# Patient Record
Sex: Male | Born: 1960 | Race: Black or African American | Hispanic: No | Marital: Single | Smoking: Current some day smoker
Health system: Southern US, Community
[De-identification: ages and names within clinical notes are randomized; demographics above are authoritative.]

## PROBLEM LIST (undated history)

## (undated) DIAGNOSIS — M62838 Other muscle spasm: Secondary | ICD-10-CM

## (undated) DIAGNOSIS — N184 Chronic kidney disease, stage 4 (severe): Secondary | ICD-10-CM

## (undated) DIAGNOSIS — E119 Type 2 diabetes mellitus without complications: Secondary | ICD-10-CM

## (undated) DIAGNOSIS — E785 Hyperlipidemia, unspecified: Secondary | ICD-10-CM

## (undated) DIAGNOSIS — K5900 Constipation, unspecified: Secondary | ICD-10-CM

## (undated) DIAGNOSIS — N390 Urinary tract infection, site not specified: Secondary | ICD-10-CM

## (undated) DIAGNOSIS — L850 Acquired ichthyosis: Secondary | ICD-10-CM

## (undated) DIAGNOSIS — E559 Vitamin D deficiency, unspecified: Secondary | ICD-10-CM

## (undated) DIAGNOSIS — M109 Gout, unspecified: Secondary | ICD-10-CM

## (undated) DIAGNOSIS — R634 Abnormal weight loss: Secondary | ICD-10-CM

## (undated) DIAGNOSIS — I509 Heart failure, unspecified: Secondary | ICD-10-CM

## (undated) DIAGNOSIS — E215 Disorder of parathyroid gland, unspecified: Secondary | ICD-10-CM

## (undated) DIAGNOSIS — R251 Tremor, unspecified: Secondary | ICD-10-CM

## (undated) DIAGNOSIS — K219 Gastro-esophageal reflux disease without esophagitis: Secondary | ICD-10-CM

## (undated) DIAGNOSIS — M6281 Muscle weakness (generalized): Secondary | ICD-10-CM

## (undated) DIAGNOSIS — D649 Anemia, unspecified: Secondary | ICD-10-CM

## (undated) DIAGNOSIS — K739 Chronic hepatitis, unspecified: Secondary | ICD-10-CM

## (undated) DIAGNOSIS — I10 Essential (primary) hypertension: Secondary | ICD-10-CM

## (undated) DIAGNOSIS — M1000 Idiopathic gout, unspecified site: Secondary | ICD-10-CM

## (undated) DIAGNOSIS — Z970 Presence of artificial eye: Secondary | ICD-10-CM

## (undated) DIAGNOSIS — M1990 Unspecified osteoarthritis, unspecified site: Secondary | ICD-10-CM

## (undated) DIAGNOSIS — S02609A Fracture of mandible, unspecified, initial encounter for closed fracture: Secondary | ICD-10-CM

## (undated) HISTORY — PX: EYE SURGERY: SHX253

## (undated) HISTORY — PX: CATARACT EXTRACTION: SUR2

## (undated) SURGERY — Surgical Case
Anesthesia: *Unknown

---

## 1999-03-30 HISTORY — PX: MANDIBLE FRACTURE SURGERY: SHX706

## 2002-11-25 DIAGNOSIS — S12600A Unspecified displaced fracture of seventh cervical vertebra, initial encounter for closed fracture: Secondary | ICD-10-CM

## 2002-11-25 DIAGNOSIS — G8220 Paraplegia, unspecified: Secondary | ICD-10-CM

## 2002-11-25 DIAGNOSIS — S329XXA Fracture of unspecified parts of lumbosacral spine and pelvis, initial encounter for closed fracture: Secondary | ICD-10-CM

## 2002-11-25 DIAGNOSIS — S72409A Unspecified fracture of lower end of unspecified femur, initial encounter for closed fracture: Secondary | ICD-10-CM

## 2002-11-25 HISTORY — PX: ORIF ULNAR / RADIAL SHAFT FRACTURE: SUR966

## 2002-11-25 HISTORY — DX: Fracture of unspecified parts of lumbosacral spine and pelvis, initial encounter for closed fracture: S32.9XXA

## 2002-11-25 HISTORY — DX: Unspecified displaced fracture of seventh cervical vertebra, initial encounter for closed fracture: S12.600A

## 2002-11-25 HISTORY — DX: Unspecified fracture of lower end of unspecified femur, initial encounter for closed fracture: S72.409A

## 2002-11-25 HISTORY — PX: HIP FRACTURE SURGERY: SHX118

## 2002-11-25 HISTORY — DX: Paraplegia, unspecified: G82.20

## 2003-06-25 HISTORY — PX: IVC FILTER INSERTION: CATH118245

## 2009-06-12 ENCOUNTER — Encounter (HOSPITAL_BASED_OUTPATIENT_CLINIC_OR_DEPARTMENT_OTHER): Admission: RE | Admit: 2009-06-12 | Discharge: 2009-08-24 | Payer: Self-pay | Admitting: General Surgery

## 2009-07-04 ENCOUNTER — Emergency Department (HOSPITAL_COMMUNITY): Admission: EM | Admit: 2009-07-04 | Discharge: 2009-07-04 | Payer: Self-pay | Admitting: Emergency Medicine

## 2009-08-01 ENCOUNTER — Emergency Department (HOSPITAL_COMMUNITY): Admission: EM | Admit: 2009-08-01 | Discharge: 2009-08-01 | Payer: Self-pay | Admitting: Emergency Medicine

## 2009-08-29 ENCOUNTER — Encounter (HOSPITAL_BASED_OUTPATIENT_CLINIC_OR_DEPARTMENT_OTHER): Admission: RE | Admit: 2009-08-29 | Discharge: 2009-10-02 | Payer: Self-pay | Admitting: General Surgery

## 2009-09-21 ENCOUNTER — Emergency Department (HOSPITAL_COMMUNITY): Admission: EM | Admit: 2009-09-21 | Discharge: 2009-09-21 | Payer: Self-pay | Admitting: Emergency Medicine

## 2009-10-02 ENCOUNTER — Encounter (HOSPITAL_BASED_OUTPATIENT_CLINIC_OR_DEPARTMENT_OTHER): Admission: RE | Admit: 2009-10-02 | Discharge: 2009-11-06 | Payer: Self-pay | Admitting: Internal Medicine

## 2009-10-03 ENCOUNTER — Encounter (INDEPENDENT_AMBULATORY_CARE_PROVIDER_SITE_OTHER): Payer: Self-pay | Admitting: Emergency Medicine

## 2009-10-03 ENCOUNTER — Ambulatory Visit: Payer: Self-pay | Admitting: Vascular Surgery

## 2009-10-03 ENCOUNTER — Emergency Department (HOSPITAL_COMMUNITY): Admission: EM | Admit: 2009-10-03 | Discharge: 2009-10-03 | Payer: Self-pay | Admitting: Emergency Medicine

## 2009-12-10 ENCOUNTER — Inpatient Hospital Stay (HOSPITAL_COMMUNITY): Admission: EM | Admit: 2009-12-10 | Discharge: 2009-12-11 | Payer: Self-pay | Admitting: Emergency Medicine

## 2010-01-23 ENCOUNTER — Inpatient Hospital Stay (HOSPITAL_COMMUNITY): Admission: EM | Admit: 2010-01-23 | Discharge: 2010-01-26 | Payer: Self-pay | Admitting: Emergency Medicine

## 2010-10-29 ENCOUNTER — Emergency Department (HOSPITAL_COMMUNITY)
Admission: EM | Admit: 2010-10-29 | Discharge: 2010-10-29 | Payer: Self-pay | Source: Home / Self Care | Admitting: Podiatry

## 2010-11-25 DIAGNOSIS — S0292XA Unspecified fracture of facial bones, initial encounter for closed fracture: Secondary | ICD-10-CM

## 2010-11-25 HISTORY — DX: Unspecified fracture of facial bones, initial encounter for closed fracture: S02.92XA

## 2011-02-10 LAB — URINALYSIS, ROUTINE W REFLEX MICROSCOPIC
Bilirubin Urine: NEGATIVE
pH: 7.5 (ref 5.0–8.0)

## 2011-02-10 LAB — BASIC METABOLIC PANEL
BUN: 10 mg/dL (ref 6–23)
CO2: 25 mEq/L (ref 19–32)
Chloride: 106 mEq/L (ref 96–112)
Creatinine, Ser: 1.37 mg/dL (ref 0.4–1.5)
GFR calc non Af Amer: 55 mL/min — ABNORMAL LOW (ref >60)
Potassium: 4.1 mEq/L (ref 3.5–5.1)
Sodium: 138 mEq/L (ref 135–145)

## 2011-02-10 LAB — CBC
HCT: 38.3 % — ABNORMAL LOW (ref 39.0–52.0)
HCT: 45.9 % (ref 39.0–52.0)
Hemoglobin: 12.6 g/dL — ABNORMAL LOW (ref 13.0–17.0)
Hemoglobin: 15.2 g/dL (ref 13.0–17.0)
MCHC: 33.1 g/dL (ref 30.0–36.0)
MCV: 88.3 fL (ref 78.0–100.0)
MCV: 88.5 fL (ref 78.0–100.0)
Platelets: 142 10*3/uL — ABNORMAL LOW (ref 150–400)
RBC: 5.2 MIL/uL (ref 4.22–5.81)
RDW: 14.6 % (ref 11.5–15.5)
RDW: 14.8 % (ref 11.5–15.5)
WBC: 5.3 10*3/uL (ref 4.0–10.5)

## 2011-02-10 LAB — DIFFERENTIAL
Lymphocytes Relative: 21 % (ref 12–46)
Monocytes Relative: 9 % (ref 3–12)
Neutro Abs: 3.7 10*3/uL (ref 1.7–7.7)
Neutrophils Relative %: 69 % (ref 43–77)

## 2011-02-10 LAB — VALPROIC ACID LEVEL: Valproic Acid Lvl: 51.5 ug/mL (ref 50.0–100.0)

## 2011-02-10 LAB — POCT I-STAT, CHEM 8
Glucose, Bld: 63 mg/dL — ABNORMAL LOW (ref 70–99)
HCT: 49 % (ref 39.0–52.0)
Hemoglobin: 16.7 g/dL (ref 13.0–17.0)
Sodium: 145 mEq/L (ref 135–145)
TCO2: 32 mmol/L (ref 0–100)

## 2011-02-10 LAB — GLUCOSE, CAPILLARY
Glucose-Capillary: 128 mg/dL — ABNORMAL HIGH (ref 70–99)
Glucose-Capillary: 136 mg/dL — ABNORMAL HIGH (ref 70–99)
Glucose-Capillary: 176 mg/dL — ABNORMAL HIGH (ref 70–99)
Glucose-Capillary: 189 mg/dL — ABNORMAL HIGH (ref 70–99)
Glucose-Capillary: 193 mg/dL — ABNORMAL HIGH (ref 70–99)
Glucose-Capillary: 56 mg/dL — ABNORMAL LOW (ref 70–99)
Glucose-Capillary: 87 mg/dL (ref 70–99)

## 2011-02-10 LAB — CARDIAC PANEL(CRET KIN+CKTOT+MB+TROPI)
CK, MB: 0.7 ng/mL (ref 0.3–4.0)
Total CK: 43 U/L (ref 7–232)

## 2011-02-10 LAB — POCT CARDIAC MARKERS: CKMB, poc: <1 ng/mL — ABNORMAL LOW (ref 1.0–8.0)

## 2011-02-10 LAB — URINE MICROSCOPIC-ADD ON

## 2011-02-18 LAB — CBC
HCT: 38 % — ABNORMAL LOW (ref 39.0–52.0)
Hemoglobin: 12.8 g/dL — ABNORMAL LOW (ref 13.0–17.0)
Hemoglobin: 13.6 g/dL (ref 13.0–17.0)
RBC: 4.28 MIL/uL (ref 4.22–5.81)
RBC: 4.58 MIL/uL (ref 4.22–5.81)
WBC: 5.4 10*3/uL (ref 4.0–10.5)

## 2011-02-18 LAB — COMPREHENSIVE METABOLIC PANEL
ALT: 32 U/L (ref 0–53)
Alkaline Phosphatase: 41 U/L (ref 39–117)
BUN: 10 mg/dL (ref 6–23)
CO2: 26 mEq/L (ref 19–32)
Chloride: 111 mEq/L (ref 96–112)
GFR calc non Af Amer: >60 mL/min (ref >60)
Glucose, Bld: 88 mg/dL (ref 70–99)
Potassium: 3.9 mEq/L (ref 3.5–5.1)
Sodium: 141 mEq/L (ref 135–145)
Total Bilirubin: 0.7 mg/dL (ref 0.3–1.2)

## 2011-02-18 LAB — POCT CARDIAC MARKERS
CKMB, poc: <1 ng/mL — ABNORMAL LOW (ref 1.0–8.0)
Myoglobin, poc: 80.1 ng/mL (ref 12–200)
Troponin i, poc: <0.05 ng/mL (ref 0.00–0.09)

## 2011-02-18 LAB — GLUCOSE, CAPILLARY
Glucose-Capillary: 104 mg/dL — ABNORMAL HIGH (ref 70–99)
Glucose-Capillary: 105 mg/dL — ABNORMAL HIGH (ref 70–99)
Glucose-Capillary: 107 mg/dL — ABNORMAL HIGH (ref 70–99)
Glucose-Capillary: 118 mg/dL — ABNORMAL HIGH (ref 70–99)
Glucose-Capillary: 123 mg/dL — ABNORMAL HIGH (ref 70–99)
Glucose-Capillary: 124 mg/dL — ABNORMAL HIGH (ref 70–99)
Glucose-Capillary: 125 mg/dL — ABNORMAL HIGH (ref 70–99)
Glucose-Capillary: 142 mg/dL — ABNORMAL HIGH (ref 70–99)
Glucose-Capillary: 145 mg/dL — ABNORMAL HIGH (ref 70–99)
Glucose-Capillary: 156 mg/dL — ABNORMAL HIGH (ref 70–99)
Glucose-Capillary: 65 mg/dL — ABNORMAL LOW (ref 70–99)
Glucose-Capillary: 91 mg/dL (ref 70–99)
Glucose-Capillary: 94 mg/dL (ref 70–99)
Glucose-Capillary: <10 mg/dL — CL (ref 70–99)

## 2011-02-18 LAB — URINALYSIS, ROUTINE W REFLEX MICROSCOPIC
Nitrite: NEGATIVE
Protein, ur: 100 mg/dL — AB
Specific Gravity, Urine: 1.016 (ref 1.005–1.030)
Urobilinogen, UA: 1 mg/dL (ref 0.0–1.0)

## 2011-02-18 LAB — BASIC METABOLIC PANEL
BUN: 12 mg/dL (ref 6–23)
CO2: 23 mEq/L (ref 19–32)
Calcium: 8.2 mg/dL — ABNORMAL LOW (ref 8.4–10.5)
Chloride: 106 mEq/L (ref 96–112)
GFR calc Af Amer: >60 mL/min (ref >60)
GFR calc non Af Amer: >60 mL/min (ref >60)
Potassium: 4.1 mEq/L (ref 3.5–5.1)
Sodium: 137 mEq/L (ref 135–145)
Sodium: 138 mEq/L (ref 135–145)

## 2011-02-18 LAB — URINE MICROSCOPIC-ADD ON

## 2011-02-18 LAB — DIFFERENTIAL
Lymphocytes Relative: 31 % (ref 12–46)
Lymphs Abs: 1.7 10*3/uL (ref 0.7–4.0)
Monocytes Absolute: 0.6 10*3/uL (ref 0.1–1.0)
Monocytes Relative: 12 % (ref 3–12)
Neutro Abs: 3 10*3/uL (ref 1.7–7.7)
Neutrophils Relative %: 55 % (ref 43–77)

## 2011-02-18 LAB — LIPID PANEL
LDL Cholesterol: 135 mg/dL — ABNORMAL HIGH (ref 0–99)
Total CHOL/HDL Ratio: 5.5 RATIO
VLDL: 12 mg/dL (ref 0–40)

## 2011-02-18 LAB — HEMOGLOBIN A1C
Hgb A1c MFr Bld: 5.7 % (ref 4.6–6.1)
Mean Plasma Glucose: 117 mg/dL

## 2011-02-18 LAB — PHENYTOIN LEVEL, FREE AND TOTAL

## 2011-02-27 LAB — APTT: aPTT: 32 seconds (ref 24–37)

## 2011-02-27 LAB — DIFFERENTIAL
Basophils Absolute: 0.1 10*3/uL (ref 0.0–0.1)
Basophils Relative: 1 % (ref 0–1)
Eosinophils Relative: 3 % (ref 0–5)
Monocytes Absolute: 0.5 10*3/uL (ref 0.1–1.0)
Monocytes Relative: 9 % (ref 3–12)
Neutro Abs: 2.6 10*3/uL (ref 1.7–7.7)

## 2011-02-27 LAB — CBC
HCT: 42 % (ref 39.0–52.0)
Hemoglobin: 14 g/dL (ref 13.0–17.0)
MCHC: 33.3 g/dL (ref 30.0–36.0)
RBC: 4.69 MIL/uL (ref 4.22–5.81)
RDW: 14.5 % (ref 11.5–15.5)

## 2011-02-27 LAB — BASIC METABOLIC PANEL
CO2: 28 mEq/L (ref 19–32)
Chloride: 103 mEq/L (ref 96–112)
GFR calc Af Amer: 60 mL/min — ABNORMAL LOW (ref >60)
Glucose, Bld: 157 mg/dL — ABNORMAL HIGH (ref 70–99)
Potassium: 3.9 mEq/L (ref 3.5–5.1)
Sodium: 140 mEq/L (ref 135–145)

## 2011-02-28 LAB — URINALYSIS, ROUTINE W REFLEX MICROSCOPIC
Glucose, UA: NEGATIVE mg/dL
Hgb urine dipstick: NEGATIVE
Leukocytes, UA: NEGATIVE
Protein, ur: 100 mg/dL — AB
Specific Gravity, Urine: 1.018 (ref 1.005–1.030)
Urobilinogen, UA: 1 mg/dL (ref 0.0–1.0)

## 2011-02-28 LAB — URINE MICROSCOPIC-ADD ON

## 2011-02-28 LAB — POCT I-STAT, CHEM 8
BUN: 17 mg/dL (ref 6–23)
Calcium, Ion: 1.1 mmol/L — ABNORMAL LOW (ref 1.12–1.32)
Chloride: 106 mEq/L (ref 96–112)
Glucose, Bld: 164 mg/dL — ABNORMAL HIGH (ref 70–99)
TCO2: 28 mmol/L (ref 0–100)

## 2011-02-28 LAB — GLUCOSE, CAPILLARY
Glucose-Capillary: 70 mg/dL (ref 70–99)
Glucose-Capillary: 76 mg/dL (ref 70–99)

## 2011-03-01 LAB — POCT I-STAT, CHEM 8
Calcium, Ion: 1.12 mmol/L (ref 1.12–1.32)
Glucose, Bld: 81 mg/dL (ref 70–99)
HCT: 45 % (ref 39.0–52.0)
Hemoglobin: 15.3 g/dL (ref 13.0–17.0)
TCO2: 27 mmol/L (ref 0–100)

## 2011-03-02 LAB — DIFFERENTIAL
Basophils Absolute: 0 10*3/uL (ref 0.0–0.1)
Basophils Relative: 1 % (ref 0–1)
Eosinophils Absolute: 0.1 10*3/uL (ref 0.0–0.7)
Monocytes Absolute: 0.8 10*3/uL (ref 0.1–1.0)
Neutro Abs: 2.5 10*3/uL (ref 1.7–7.7)

## 2011-03-02 LAB — COMPREHENSIVE METABOLIC PANEL
ALT: 65 U/L — ABNORMAL HIGH (ref 0–53)
Albumin: 3.2 g/dL — ABNORMAL LOW (ref 3.5–5.2)
Alkaline Phosphatase: 51 U/L (ref 39–117)
BUN: 20 mg/dL (ref 6–23)
Chloride: 107 mEq/L (ref 96–112)
Glucose, Bld: 42 mg/dL — ABNORMAL LOW (ref 70–99)
Potassium: 4 mEq/L (ref 3.5–5.1)
Sodium: 143 mEq/L (ref 135–145)
Total Bilirubin: 0.6 mg/dL (ref 0.3–1.2)

## 2011-03-02 LAB — URINALYSIS, ROUTINE W REFLEX MICROSCOPIC
Bilirubin Urine: NEGATIVE
Glucose, UA: NEGATIVE mg/dL
Ketones, ur: NEGATIVE mg/dL
pH: 6 (ref 5.0–8.0)

## 2011-03-02 LAB — CBC
HCT: 44.1 % (ref 39.0–52.0)
Hemoglobin: 14.5 g/dL (ref 13.0–17.0)
Platelets: 133 10*3/uL — ABNORMAL LOW (ref 150–400)
WBC: 5.5 10*3/uL (ref 4.0–10.5)

## 2011-03-02 LAB — HEMOCCULT GUIAC POC 1CARD (OFFICE): Fecal Occult Bld: NEGATIVE

## 2011-03-03 LAB — GLUCOSE, CAPILLARY: Glucose-Capillary: 93 mg/dL (ref 70–99)
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
## 2011-07-04 ENCOUNTER — Emergency Department (HOSPITAL_COMMUNITY): Payer: Medicaid Other

## 2011-07-04 ENCOUNTER — Encounter (HOSPITAL_COMMUNITY): Payer: Self-pay | Admitting: Radiology

## 2011-07-04 ENCOUNTER — Inpatient Hospital Stay (HOSPITAL_COMMUNITY): Payer: Medicaid Other

## 2011-07-04 ENCOUNTER — Inpatient Hospital Stay (HOSPITAL_COMMUNITY)
Admission: EM | Admit: 2011-07-04 | Discharge: 2011-07-12 | DRG: 040 | Disposition: A | Discharge status: Skilled Nursing Facility | Payer: Medicaid Other | Attending: Surgery | Admitting: Surgery

## 2011-07-04 DIAGNOSIS — S02109B Fracture of base of skull, unspecified side, initial encounter for open fracture: Principal | ICD-10-CM | POA: Diagnosis present

## 2011-07-04 DIAGNOSIS — E119 Type 2 diabetes mellitus without complications: Secondary | ICD-10-CM | POA: Diagnosis present

## 2011-07-04 DIAGNOSIS — I10 Essential (primary) hypertension: Secondary | ICD-10-CM

## 2011-07-04 DIAGNOSIS — Y998 Other external cause status: Secondary | ICD-10-CM

## 2011-07-04 DIAGNOSIS — S02401B Maxillary fracture, unspecified, initial encounter for open fracture: Secondary | ICD-10-CM | POA: Diagnosis present

## 2011-07-04 DIAGNOSIS — J95821 Acute postprocedural respiratory failure: Secondary | ICD-10-CM | POA: Diagnosis present

## 2011-07-04 DIAGNOSIS — F329 Major depressive disorder, single episode, unspecified: Secondary | ICD-10-CM | POA: Diagnosis present

## 2011-07-04 DIAGNOSIS — G8220 Paraplegia, unspecified: Secondary | ICD-10-CM | POA: Diagnosis present

## 2011-07-04 DIAGNOSIS — G9389 Other specified disorders of brain: Secondary | ICD-10-CM | POA: Diagnosis present

## 2011-07-04 DIAGNOSIS — S0180XA Unspecified open wound of other part of head, initial encounter: Secondary | ICD-10-CM | POA: Diagnosis present

## 2011-07-04 DIAGNOSIS — X58XXXS Exposure to other specified factors, sequela: Secondary | ICD-10-CM

## 2011-07-04 DIAGNOSIS — I129 Hypertensive chronic kidney disease with stage 1 through stage 4 chronic kidney disease, or unspecified chronic kidney disease: Secondary | ICD-10-CM | POA: Diagnosis present

## 2011-07-04 DIAGNOSIS — S069XAS Unspecified intracranial injury with loss of consciousness status unknown, sequela: Secondary | ICD-10-CM

## 2011-07-04 DIAGNOSIS — IMO0002 Reserved for concepts with insufficient information to code with codable children: Secondary | ICD-10-CM

## 2011-07-04 DIAGNOSIS — Y9210 Unspecified residential institution as the place of occurrence of the external cause: Secondary | ICD-10-CM | POA: Diagnosis present

## 2011-07-04 DIAGNOSIS — J9600 Acute respiratory failure, unspecified whether with hypoxia or hypercapnia: Secondary | ICD-10-CM

## 2011-07-04 DIAGNOSIS — B1920 Unspecified viral hepatitis C without hepatic coma: Secondary | ICD-10-CM | POA: Diagnosis present

## 2011-07-04 DIAGNOSIS — D62 Acute posthemorrhagic anemia: Secondary | ICD-10-CM | POA: Diagnosis present

## 2011-07-04 DIAGNOSIS — F17200 Nicotine dependence, unspecified, uncomplicated: Secondary | ICD-10-CM | POA: Diagnosis present

## 2011-07-04 DIAGNOSIS — H5440 Blindness, one eye, unspecified eye: Secondary | ICD-10-CM | POA: Diagnosis present

## 2011-07-04 DIAGNOSIS — S069X9S Unspecified intracranial injury with loss of consciousness of unspecified duration, sequela: Secondary | ICD-10-CM

## 2011-07-04 DIAGNOSIS — F3289 Other specified depressive episodes: Secondary | ICD-10-CM | POA: Diagnosis present

## 2011-07-04 DIAGNOSIS — N189 Chronic kidney disease, unspecified: Secondary | ICD-10-CM | POA: Diagnosis present

## 2011-07-04 DIAGNOSIS — I959 Hypotension, unspecified: Secondary | ICD-10-CM | POA: Diagnosis not present

## 2011-07-04 DIAGNOSIS — Z87820 Personal history of traumatic brain injury: Secondary | ICD-10-CM

## 2011-07-04 DIAGNOSIS — S02400B Malar fracture unspecified, initial encounter for open fracture: Secondary | ICD-10-CM | POA: Diagnosis present

## 2011-07-04 DIAGNOSIS — S0520XA Ocular laceration and rupture with prolapse or loss of intraocular tissue, unspecified eye, initial encounter: Secondary | ICD-10-CM | POA: Diagnosis present

## 2011-07-04 HISTORY — DX: Presence of artificial eye: Z97.0

## 2011-07-04 LAB — TYPE AND SCREEN
ABO/RH(D): O POS
Antibody Screen: NEGATIVE
Unit division: 0

## 2011-07-04 LAB — CBC
HCT: 42.1 % (ref 39.0–52.0)
MCHC: 34.4 g/dL (ref 30.0–36.0)
RDW: 14.5 % (ref 11.5–15.5)

## 2011-07-04 LAB — COMPREHENSIVE METABOLIC PANEL
AST: 14 U/L (ref 0–37)
Albumin: 3.1 g/dL — ABNORMAL LOW (ref 3.5–5.2)
Alkaline Phosphatase: 38 U/L — ABNORMAL LOW (ref 39–117)
BUN: 30 mg/dL — ABNORMAL HIGH (ref 6–23)
CO2: 22 mEq/L (ref 19–32)
Chloride: 99 mEq/L (ref 96–112)
Creatinine, Ser: 1.39 mg/dL — ABNORMAL HIGH (ref 0.50–1.35)
GFR calc non Af Amer: 54 mL/min — ABNORMAL LOW (ref >60)
Potassium: 4.3 mEq/L (ref 3.5–5.1)
Total Bilirubin: 0.1 mg/dL — ABNORMAL LOW (ref 0.3–1.2)

## 2011-07-04 LAB — POCT I-STAT, CHEM 8
Calcium, Ion: 1.15 mmol/L (ref 1.12–1.32)
Glucose, Bld: 76 mg/dL (ref 70–99)
HCT: 45 % (ref 39.0–52.0)
Hemoglobin: 15.3 g/dL (ref 13.0–17.0)

## 2011-07-04 LAB — LACTIC ACID, PLASMA: Lactic Acid, Venous: 3.8 mmol/L — ABNORMAL HIGH (ref 0.5–2.2)

## 2011-07-05 ENCOUNTER — Inpatient Hospital Stay (HOSPITAL_COMMUNITY): Payer: Medicaid Other

## 2011-07-05 LAB — BASIC METABOLIC PANEL
BUN: 25 mg/dL — ABNORMAL HIGH (ref 6–23)
CO2: 24 mEq/L (ref 19–32)
Chloride: 102 mEq/L (ref 96–112)
Glucose, Bld: 118 mg/dL — ABNORMAL HIGH (ref 70–99)
Potassium: 4.9 mEq/L (ref 3.5–5.1)
Sodium: 138 mEq/L (ref 135–145)

## 2011-07-05 LAB — POCT I-STAT 4, (NA,K, GLUC, HGB,HCT)
Glucose, Bld: 88 mg/dL (ref 70–99)
HCT: 44 % (ref 39.0–52.0)
Hemoglobin: 15 g/dL (ref 13.0–17.0)
Potassium: 4.7 meq/L (ref 3.5–5.1)
Sodium: 140 meq/L (ref 135–145)

## 2011-07-05 LAB — GLUCOSE, CAPILLARY
Glucose-Capillary: 116 mg/dL — ABNORMAL HIGH (ref 70–99)
Glucose-Capillary: 135 mg/dL — ABNORMAL HIGH (ref 70–99)
Glucose-Capillary: 206 mg/dL — ABNORMAL HIGH (ref 70–99)

## 2011-07-05 LAB — CBC
HCT: 36.4 % — ABNORMAL LOW (ref 39.0–52.0)
Hemoglobin: 12.5 g/dL — ABNORMAL LOW (ref 13.0–17.0)
RBC: 4.13 MIL/uL — ABNORMAL LOW (ref 4.22–5.81)
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
## 2011-07-06 ENCOUNTER — Inpatient Hospital Stay (HOSPITAL_COMMUNITY): Payer: Medicaid Other

## 2011-07-06 LAB — BASIC METABOLIC PANEL
Calcium: 8.3 mg/dL — ABNORMAL LOW (ref 8.4–10.5)
GFR calc Af Amer: >60 mL/min (ref >60)
GFR calc non Af Amer: >60 mL/min (ref >60)
Sodium: 140 mEq/L (ref 135–145)

## 2011-07-06 LAB — CBC
MCH: 29.8 pg (ref 26.0–34.0)
MCHC: 34.2 g/dL (ref 30.0–36.0)
Platelets: 179 10*3/uL (ref 150–400)
RBC: 3.62 MIL/uL — ABNORMAL LOW (ref 4.22–5.81)
RDW: 14.1 % (ref 11.5–15.5)

## 2011-07-06 LAB — GLUCOSE, CAPILLARY
Glucose-Capillary: 119 mg/dL — ABNORMAL HIGH (ref 70–99)
Glucose-Capillary: 157 mg/dL — ABNORMAL HIGH (ref 70–99)

## 2011-07-07 LAB — GLUCOSE, CAPILLARY
Glucose-Capillary: 124 mg/dL — ABNORMAL HIGH (ref 70–99)
Glucose-Capillary: 120 mg/dL — ABNORMAL HIGH (ref 70–99)
Glucose-Capillary: 99 mg/dL (ref 70–99)

## 2011-07-08 LAB — BASIC METABOLIC PANEL
CO2: 28 mEq/L (ref 19–32)
Calcium: 8.7 mg/dL (ref 8.4–10.5)
Chloride: 103 mEq/L (ref 96–112)
GFR calc Af Amer: >60 mL/min (ref >60)
Sodium: 137 mEq/L (ref 135–145)

## 2011-07-08 LAB — MAGNESIUM: Magnesium: 1.6 mg/dL (ref 1.5–2.5)

## 2011-07-08 LAB — VANCOMYCIN, TROUGH: Vancomycin Tr: 12.2 ug/mL (ref 10.0–20.0)

## 2011-07-08 LAB — GLUCOSE, CAPILLARY
Glucose-Capillary: 103 mg/dL — ABNORMAL HIGH (ref 70–99)
Glucose-Capillary: 129 mg/dL — ABNORMAL HIGH (ref 70–99)
Glucose-Capillary: 92 mg/dL (ref 70–99)

## 2011-07-08 LAB — CBC
Platelets: 214 10*3/uL (ref 150–400)
RBC: 3.42 MIL/uL — ABNORMAL LOW (ref 4.22–5.81)
RDW: 13.6 % (ref 11.5–15.5)
WBC: 7.7 10*3/uL (ref 4.0–10.5)

## 2011-07-08 LAB — PHOSPHORUS: Phosphorus: 2.1 mg/dL — ABNORMAL LOW (ref 2.3–4.6)
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
## 2011-07-09 DIAGNOSIS — F329 Major depressive disorder, single episode, unspecified: Secondary | ICD-10-CM

## 2011-07-09 LAB — GLUCOSE, CAPILLARY
Glucose-Capillary: 92 mg/dL (ref 70–99)
Glucose-Capillary: 142 mg/dL — ABNORMAL HIGH (ref 70–99)
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
## 2011-07-10 LAB — GLUCOSE, CAPILLARY
Glucose-Capillary: 119 mg/dL — ABNORMAL HIGH (ref 70–99)
Glucose-Capillary: 107 mg/dL — ABNORMAL HIGH (ref 70–99)
Glucose-Capillary: 92 mg/dL (ref 70–99)
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
## 2011-07-11 DIAGNOSIS — F329 Major depressive disorder, single episode, unspecified: Secondary | ICD-10-CM

## 2011-07-12 LAB — CBC
HCT: 31.7 % — ABNORMAL LOW (ref 39.0–52.0)
MCH: 29.6 pg (ref 26.0–34.0)
MCV: 84.5 fL (ref 78.0–100.0)
Platelets: 292 10*3/uL (ref 150–400)
RDW: 12.9 % (ref 11.5–15.5)

## 2011-07-12 LAB — BASIC METABOLIC PANEL
BUN: 10 mg/dL (ref 6–23)
Chloride: 99 mEq/L (ref 96–112)
Creatinine, Ser: 1 mg/dL (ref 0.50–1.35)
GFR calc Af Amer: >60 mL/min (ref >60)
GFR calc non Af Amer: >60 mL/min (ref >60)
Glucose, Bld: 85 mg/dL (ref 70–99)

## 2011-07-18 ENCOUNTER — Encounter (INDEPENDENT_AMBULATORY_CARE_PROVIDER_SITE_OTHER): Payer: Medicaid Other

## 2011-08-04 ENCOUNTER — Other Ambulatory Visit: Payer: Self-pay | Admitting: Family Medicine
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
## 2011-11-26 HISTORY — PX: LEG SURGERY: SHX1003

## 2012-07-23 DIAGNOSIS — M86669 Other chronic osteomyelitis, unspecified tibia and fibula: Secondary | ICD-10-CM | POA: Insufficient documentation
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
## 2015-08-29 DIAGNOSIS — M79605 Pain in left leg: Secondary | ICD-10-CM | POA: Insufficient documentation

## 2017-06-02 DIAGNOSIS — M6281 Muscle weakness (generalized): Secondary | ICD-10-CM | POA: Insufficient documentation

## 2017-06-02 DIAGNOSIS — E119 Type 2 diabetes mellitus without complications: Secondary | ICD-10-CM | POA: Insufficient documentation

## 2017-06-02 DIAGNOSIS — I10 Essential (primary) hypertension: Secondary | ICD-10-CM | POA: Insufficient documentation

## 2017-06-02 DIAGNOSIS — D649 Anemia, unspecified: Secondary | ICD-10-CM | POA: Insufficient documentation

## 2018-01-01 DIAGNOSIS — I739 Peripheral vascular disease, unspecified: Secondary | ICD-10-CM | POA: Insufficient documentation

## 2018-11-04 DIAGNOSIS — R251 Tremor, unspecified: Secondary | ICD-10-CM | POA: Insufficient documentation

## 2020-12-07 ENCOUNTER — Other Ambulatory Visit: Payer: Self-pay | Admitting: Neurology

## 2020-12-07 DIAGNOSIS — M79604 Pain in right leg: Secondary | ICD-10-CM

## 2020-12-07 DIAGNOSIS — M79605 Pain in left leg: Secondary | ICD-10-CM

## 2020-12-10 DIAGNOSIS — R262 Difficulty in walking, not elsewhere classified: Secondary | ICD-10-CM | POA: Insufficient documentation

## 2020-12-13 ENCOUNTER — Other Ambulatory Visit: Payer: Self-pay

## 2020-12-13 ENCOUNTER — Ambulatory Visit
Admission: RE | Admit: 2020-12-13 | Discharge: 2020-12-13 | Disposition: A | Discharge status: Home / Self Care | Payer: Medicaid Other | Source: Ambulatory Visit | Attending: Neurology | Admitting: Neurology

## 2020-12-13 DIAGNOSIS — M79604 Pain in right leg: Secondary | ICD-10-CM | POA: Diagnosis not present

## 2020-12-13 DIAGNOSIS — M79605 Pain in left leg: Secondary | ICD-10-CM | POA: Insufficient documentation

## 2021-10-17 ENCOUNTER — Other Ambulatory Visit (INDEPENDENT_AMBULATORY_CARE_PROVIDER_SITE_OTHER): Payer: Self-pay | Admitting: Nurse Practitioner

## 2021-10-17 DIAGNOSIS — N184 Chronic kidney disease, stage 4 (severe): Secondary | ICD-10-CM

## 2021-10-22 ENCOUNTER — Other Ambulatory Visit: Payer: Self-pay

## 2021-10-22 ENCOUNTER — Ambulatory Visit (INDEPENDENT_AMBULATORY_CARE_PROVIDER_SITE_OTHER): Payer: Medicaid Other

## 2021-10-22 ENCOUNTER — Ambulatory Visit (INDEPENDENT_AMBULATORY_CARE_PROVIDER_SITE_OTHER): Payer: Medicaid Other | Admitting: Vascular Surgery

## 2021-10-22 ENCOUNTER — Encounter (INDEPENDENT_AMBULATORY_CARE_PROVIDER_SITE_OTHER): Payer: Self-pay | Admitting: Vascular Surgery

## 2021-10-22 VITALS — BP 119/78 | HR 64 | Resp 16

## 2021-10-22 DIAGNOSIS — G8220 Paraplegia, unspecified: Secondary | ICD-10-CM | POA: Insufficient documentation

## 2021-10-22 DIAGNOSIS — K219 Gastro-esophageal reflux disease without esophagitis: Secondary | ICD-10-CM | POA: Insufficient documentation

## 2021-10-22 DIAGNOSIS — J309 Allergic rhinitis, unspecified: Secondary | ICD-10-CM | POA: Insufficient documentation

## 2021-10-22 DIAGNOSIS — F411 Generalized anxiety disorder: Secondary | ICD-10-CM | POA: Insufficient documentation

## 2021-10-22 DIAGNOSIS — E782 Mixed hyperlipidemia: Secondary | ICD-10-CM | POA: Diagnosis not present

## 2021-10-22 DIAGNOSIS — N184 Chronic kidney disease, stage 4 (severe): Secondary | ICD-10-CM

## 2021-10-22 DIAGNOSIS — E119 Type 2 diabetes mellitus without complications: Secondary | ICD-10-CM | POA: Insufficient documentation

## 2021-10-22 DIAGNOSIS — K739 Chronic hepatitis, unspecified: Secondary | ICD-10-CM | POA: Insufficient documentation

## 2021-10-22 DIAGNOSIS — I10 Essential (primary) hypertension: Secondary | ICD-10-CM | POA: Diagnosis not present

## 2021-10-22 DIAGNOSIS — N185 Chronic kidney disease, stage 5: Secondary | ICD-10-CM | POA: Insufficient documentation

## 2021-10-22 DIAGNOSIS — I259 Chronic ischemic heart disease, unspecified: Secondary | ICD-10-CM | POA: Insufficient documentation

## 2021-10-22 DIAGNOSIS — D509 Iron deficiency anemia, unspecified: Secondary | ICD-10-CM | POA: Insufficient documentation

## 2021-10-22 DIAGNOSIS — H9190 Unspecified hearing loss, unspecified ear: Secondary | ICD-10-CM | POA: Insufficient documentation

## 2021-10-22 DIAGNOSIS — E785 Hyperlipidemia, unspecified: Secondary | ICD-10-CM | POA: Insufficient documentation

## 2021-10-22 NOTE — Progress Notes (Signed)
MRN : [id number]  [name] is a 60 y.o. (11/09/1961) male who presents with chief complaint of here for dialysis access.  History of Present Illness:   The patient is seen for evaluation for dialysis access. The patient has chronic renal insufficiency stage IV secondary to hypertension and diabetes. The patient's most recent creatinine clearance is less than 20. The patient volume status has not yet become an issue. Patient's blood pressures been relatively well controlled. There are mild uremic symptoms which appear to be relatively well tolerated at this time. The patient is right-handed.  The patient has been considering the various methods of dialysis and wishes to proceed with hemodialysis and therefore creation of AV access.  The patient denies amaurosis fugax or recent TIA symptoms. There are no recent neurological changes noted. The patient denies claudication symptoms or rest pain symptoms. The patient denies history of DVT, PE or superficial thrombophlebitis. The patient denies recent episodes of angina or shortness of breath.    Current Meds  Medication Sig   AMINO ACIDS-PROTEIN HYDROLYS PO Take by mouth.   amLODipine (NORVASC) 10 MG tablet Take by mouth.   ammonium lactate (LAC-HYDRIN) 12 % lotion Apply topically 2 (two) times daily.   aspirin 81 MG EC tablet Take by mouth.   atorvastatin (LIPITOR) 40 MG tablet Take by mouth.   Cholecalciferol 50 MCG (2000 UT) CAPS Take by mouth.   cyclobenzaprine (FLEXERIL) 10 MG tablet Take by mouth.   ergocalciferol (VITAMIN D2) 1.25 MG (50000 UT) capsule Take by mouth.   gabapentin (NEURONTIN) 100 MG capsule Take by mouth.   hydrALAZINE (APRESOLINE) 25 MG tablet Take by mouth.   ketoconazole (NIZORAL) 2 % shampoo Apply topically.   lidocaine (LIDODERM) 5 % Place onto the skin.   linagliptin (TRADJENTA) 5 MG TABS tablet Take by mouth.   losartan (COZAAR) 100 MG tablet Take by mouth.   Multiple Vitamin (MULTIVITAMIN) capsule  Frequency:UNKNOWN   Dosage:0.0     Instructions:  Note:Dose: -   oxycodone (OXY-IR) 5 MG capsule Take by mouth.   pregabalin (LYRICA) 75 MG capsule Take by mouth.   primidone (MYSOLINE) 50 MG tablet Take 1 tablet by mouth 2 (two) times daily.   senna (SENOKOT) 8.6 MG tablet Take 1 tablet by mouth 2 (two) times daily.   sodium bicarbonate 650 MG tablet Take by mouth.   tobramycin-dexamethasone (TOBRADEX) ophthalmic ointment Four (4) times a day.    Past Medical History:  Diagnosis Date   Diabetes mellitus    Glaucoma    Prosthetic eye globe     Social History Social History   Tobacco Use   Smoking status: Former    Types: Cigarettes   Smokeless tobacco: Never  Substance Use Topics   Alcohol use: Not Currently   Drug use: Never    Family History Family History  Problem Relation Age of Onset   Kidney disease Brother     No Known Allergies   REVIEW OF SYSTEMS (Negative unless checked)  Constitutional: [] Weight loss  [] Fever  [] Chills Cardiac: [] Chest pain   [] Chest pressure   [] Palpitations   [] Shortness of breath when laying flat   [] Shortness of breath with exertion. Vascular:  [] Pain in legs with walking   [] Pain in legs at rest  [] History of DVT   [] Phlebitis   [] Swelling in legs   [] Varicose veins   [] Non-healing ulcers Pulmonary:   [] Uses home oxygen   [] Productive cough   [] Hemoptysis   [] Wheeze  [] COPD   []   Asthma Neurologic:  [] Dizziness   [] Seizures   [] History of stroke   [] History of TIA  [] Aphasia   [] Vissual changes   [x] Weakness or numbness in arm   [x] Weakness or numbness in leg Musculoskeletal:   [] Joint swelling   [] Joint pain   [] Low back pain Hematologic:  [] Easy bruising  [] Easy bleeding   [] Hypercoagulable state   [] Anemic Gastrointestinal:  [] Diarrhea   [] Vomiting  [x] Gastroesophageal reflux/heartburn   [] Difficulty swallowing. Genitourinary:  [x] Chronic kidney disease   [] Difficult urination  [] Frequent urination   [] Blood in urine Skin:  [] Rashes    [] Ulcers  Psychological:  [x] History of anxiety   []  History of major depression.  Physical Examination  Vitals:   10/22/21 1021  BP: 119/78  Pulse: 64  Resp: 16   There is no height or weight on file to calculate BMI. Gen: WD/WN, NAD; seen in a wheelchair Head: [place]/AT, No temporalis wasting.  Ear/Nose/Throat: Hearing grossly intact, nares w/o erythema or drainage Eyes: PER, EOMI, sclera nonicteric.  Neck: Supple, no gross masses or lesions.  No JVD.  Pulmonary:  Good air movement, no audible wheezing, no use of accessory muscles.  Cardiac: RRR, precordium non-hyperdynamic. Vascular:   palpable cephalic vein in the left antecubital fossa Vessel Right Left  Radial Palpable Palpable  Brachial Palpable Palpable  Gastrointestinal: soft, non-distended. No guarding/no peritoneal signs.  Musculoskeletal: M/S 5/5 right upper extremity, left hand is not functioning but upper arm and shoulder are 5/5.  No deformity.  Neurologic: CN 2-12 intact. Pain and light touch intact in extremities.  Symmetrical.  Speech is fluent. Motor exam as listed above. Psychiatric: Judgment intact, Mood & affect appropriate for pt's clinical situation. Dermatologic: No rashes or ulcers noted.  No changes consistent with cellulitis.   CBC Lab Results  Component Value Date   WBC 6.9 07/12/2011   HGB 11.1 (L) 07/12/2011   HCT 31.7 (L) 07/12/2011   MCV 84.5 07/12/2011   PLT 292 07/12/2011    BMET    Component Value Date/Time   NA 139 07/12/2011 0610   K 3.4 (L) 07/12/2011 0610   CL 99 07/12/2011 0610   CO2 31 07/12/2011 0610   GLUCOSE 85 07/12/2011 0610   BUN 10 07/12/2011 0610   CREATININE 1.00 07/12/2011 0610   CALCIUM 9.6 07/12/2011 0610   GFRNONAA >60 07/12/2011 0610   GFRAA >60 07/12/2011 0610   CrCl cannot be calculated (Patient's most recent lab result is older than the maximum 21 days allowed.).  COAG Lab Results  Component Value Date   INR 1.06 07/04/2011   INR 1.05 10/03/2009     Radiology No results found.   Assessment/Plan 1. Chronic renal disease, stage IV (HCC) Recommend:  At this time the patient has stage IV renal insufficiency and is not requiring hemodialysis quite yet.  Vein mapping today shows a left cephalic vein that is 2.9 mm in diameter.  Arterial evaluation shows triphasic signals in the left brachial and radial arteries.  Patient should have a left brachiocephalic fistula created.  The risks, benefits and alternative therapies were reviewed in detail with the patient.  All questions were answered.  The patient agrees to proceed with surgery.    2. Primary hypertension Continue antihypertensive medications as already ordered, these medications have been reviewed and there are no changes at this time.   3. Mixed hyperlipidemia Continue statin as ordered and reviewed, no changes at this time   4. Gastroesophageal reflux disease without esophagitis Continue PPI as already ordered,  this medication has been reviewed and there are no changes at this time.  Avoidence of caffeine and alcohol  Moderate elevation of the head of the bed      Hortencia Pilar, MD  10/22/2021 10:27 AM

## 2021-10-22 NOTE — H&P (View-Only) (Signed)
MRN : [id number]  [name] is a 60 y.o. (1961-10-02) male who presents with chief complaint of here for dialysis access.  History of Present Illness:   The patient is seen for evaluation for dialysis access. The patient has chronic renal insufficiency stage IV secondary to hypertension and diabetes. The patient's most recent creatinine clearance is less than 20. The patient volume status has not yet become an issue. Patient's blood pressures been relatively well controlled. There are mild uremic symptoms which appear to be relatively well tolerated at this time. The patient is right-handed.  The patient has been considering the various methods of dialysis and wishes to proceed with hemodialysis and therefore creation of AV access.  The patient denies amaurosis fugax or recent TIA symptoms. There are no recent neurological changes noted. The patient denies claudication symptoms or rest pain symptoms. The patient denies history of DVT, PE or superficial thrombophlebitis. The patient denies recent episodes of angina or shortness of breath.    Current Meds  Medication Sig   AMINO ACIDS-PROTEIN HYDROLYS PO Take by mouth.   amLODipine (NORVASC) 10 MG tablet Take by mouth.   ammonium lactate (LAC-HYDRIN) 12 % lotion Apply topically 2 (two) times daily.   aspirin 81 MG EC tablet Take by mouth.   atorvastatin (LIPITOR) 40 MG tablet Take by mouth.   Cholecalciferol 50 MCG (2000 UT) CAPS Take by mouth.   cyclobenzaprine (FLEXERIL) 10 MG tablet Take by mouth.   ergocalciferol (VITAMIN D2) 1.25 MG (50000 UT) capsule Take by mouth.   gabapentin (NEURONTIN) 100 MG capsule Take by mouth.   hydrALAZINE (APRESOLINE) 25 MG tablet Take by mouth.   ketoconazole (NIZORAL) 2 % shampoo Apply topically.   lidocaine (LIDODERM) 5 % Place onto the skin.   linagliptin (TRADJENTA) 5 MG TABS tablet Take by mouth.   losartan (COZAAR) 100 MG tablet Take by mouth.   Multiple Vitamin (MULTIVITAMIN) capsule  Frequency:UNKNOWN   Dosage:0.0     Instructions:  Note:Dose: -   oxycodone (OXY-IR) 5 MG capsule Take by mouth.   pregabalin (LYRICA) 75 MG capsule Take by mouth.   primidone (MYSOLINE) 50 MG tablet Take 1 tablet by mouth 2 (two) times daily.   senna (SENOKOT) 8.6 MG tablet Take 1 tablet by mouth 2 (two) times daily.   sodium bicarbonate 650 MG tablet Take by mouth.   tobramycin-dexamethasone (TOBRADEX) ophthalmic ointment Four (4) times a day.    Past Medical History:  Diagnosis Date   Diabetes mellitus    Glaucoma    Prosthetic eye globe     Social History Social History   Tobacco Use   Smoking status: Former    Types: Cigarettes   Smokeless tobacco: Never  Substance Use Topics   Alcohol use: Not Currently   Drug use: Never    Family History Family History  Problem Relation Age of Onset   Kidney disease Brother     No Known Allergies   REVIEW OF SYSTEMS (Negative unless checked)  Constitutional: [] Weight loss  [] Fever  [] Chills Cardiac: [] Chest pain   [] Chest pressure   [] Palpitations   [] Shortness of breath when laying flat   [] Shortness of breath with exertion. Vascular:  [] Pain in legs with walking   [] Pain in legs at rest  [] History of DVT   [] Phlebitis   [] Swelling in legs   [] Varicose veins   [] Non-healing ulcers Pulmonary:   [] Uses home oxygen   [] Productive cough   [] Hemoptysis   [] Wheeze  [] COPD   []   Asthma Neurologic:  [] Dizziness   [] Seizures   [] History of stroke   [] History of TIA  [] Aphasia   [] Vissual changes   [x] Weakness or numbness in arm   [x] Weakness or numbness in leg Musculoskeletal:   [] Joint swelling   [] Joint pain   [] Low back pain Hematologic:  [] Easy bruising  [] Easy bleeding   [] Hypercoagulable state   [] Anemic Gastrointestinal:  [] Diarrhea   [] Vomiting  [x] Gastroesophageal reflux/heartburn   [] Difficulty swallowing. Genitourinary:  [x] Chronic kidney disease   [] Difficult urination  [] Frequent urination   [] Blood in urine Skin:  [] Rashes    [] Ulcers  Psychological:  [x] History of anxiety   []  History of major depression.  Physical Examination  Vitals:   10/22/21 1021  BP: 119/78  Pulse: 64  Resp: 16   There is no height or weight on file to calculate BMI. Gen: WD/WN, NAD; seen in a wheelchair Head: [place]/AT, No temporalis wasting.  Ear/Nose/Throat: Hearing grossly intact, nares w/o erythema or drainage Eyes: PER, EOMI, sclera nonicteric.  Neck: Supple, no gross masses or lesions.  No JVD.  Pulmonary:  Good air movement, no audible wheezing, no use of accessory muscles.  Cardiac: RRR, precordium non-hyperdynamic. Vascular:   palpable cephalic vein in the left antecubital fossa Vessel Right Left  Radial Palpable Palpable  Brachial Palpable Palpable  Gastrointestinal: soft, non-distended. No guarding/no peritoneal signs.  Musculoskeletal: M/S 5/5 right upper extremity, left hand is not functioning but upper arm and shoulder are 5/5.  No deformity.  Neurologic: CN 2-12 intact. Pain and light touch intact in extremities.  Symmetrical.  Speech is fluent. Motor exam as listed above. Psychiatric: Judgment intact, Mood & affect appropriate for pt's clinical situation. Dermatologic: No rashes or ulcers noted.  No changes consistent with cellulitis.   CBC Lab Results  Component Value Date   WBC 6.9 07/12/2011   HGB 11.1 (L) 07/12/2011   HCT 31.7 (L) 07/12/2011   MCV 84.5 07/12/2011   PLT 292 07/12/2011    BMET    Component Value Date/Time   NA 139 07/12/2011 0610   K 3.4 (L) 07/12/2011 0610   CL 99 07/12/2011 0610   CO2 31 07/12/2011 0610   GLUCOSE 85 07/12/2011 0610   BUN 10 07/12/2011 0610   CREATININE 1.00 07/12/2011 0610   CALCIUM 9.6 07/12/2011 0610   GFRNONAA >60 07/12/2011 0610   GFRAA >60 07/12/2011 0610   CrCl cannot be calculated (Patient's most recent lab result is older than the maximum 21 days allowed.).  COAG Lab Results  Component Value Date   INR 1.06 07/04/2011   INR 1.05 10/03/2009     Radiology No results found.   Assessment/Plan 1. Chronic renal disease, stage IV (HCC) Recommend:  At this time the patient has stage IV renal insufficiency and is not requiring hemodialysis quite yet.  Vein mapping today shows a left cephalic vein that is 2.9 mm in diameter.  Arterial evaluation shows triphasic signals in the left brachial and radial arteries.  Patient should have a left brachiocephalic fistula created.  The risks, benefits and alternative therapies were reviewed in detail with the patient.  All questions were answered.  The patient agrees to proceed with surgery.    2. Primary hypertension Continue antihypertensive medications as already ordered, these medications have been reviewed and there are no changes at this time.   3. Mixed hyperlipidemia Continue statin as ordered and reviewed, no changes at this time   4. Gastroesophageal reflux disease without esophagitis Continue PPI as already ordered,  this medication has been reviewed and there are no changes at this time.  Avoidence of caffeine and alcohol  Moderate elevation of the head of the bed      Hortencia Pilar, MD  10/22/2021 10:27 AM

## 2021-10-30 ENCOUNTER — Telehealth (INDEPENDENT_AMBULATORY_CARE_PROVIDER_SITE_OTHER): Payer: Self-pay

## 2021-10-30 NOTE — Telephone Encounter (Signed)
Spoke with Christy at [facility] and the patient is scheduled with Dr. Delana Meyer for a left brachial cephalic AV fistula on 73/22/02 at the [facility]. Pre-op phone call is on 11/01/21 between 8-1 pm. Pre-surgical instructions will be faxed to attention Elmyra Ricks at [facility].

## 2021-11-01 ENCOUNTER — Inpatient Hospital Stay
Admission: RE | Admit: 2021-11-01 | Discharge: 2021-11-01 | Disposition: A | Discharge status: Home / Self Care | Payer: Medicaid Other | Source: Ambulatory Visit

## 2021-11-01 ENCOUNTER — Other Ambulatory Visit (INDEPENDENT_AMBULATORY_CARE_PROVIDER_SITE_OTHER): Payer: Self-pay | Admitting: Nurse Practitioner

## 2021-11-01 DIAGNOSIS — N184 Chronic kidney disease, stage 4 (severe): Secondary | ICD-10-CM

## 2021-11-01 HISTORY — DX: Chronic hepatitis, unspecified: K73.9

## 2021-11-01 HISTORY — DX: Chronic kidney disease, stage 4 (severe): N18.4

## 2021-11-01 HISTORY — DX: Type 2 diabetes mellitus without complications: E11.9

## 2021-11-01 HISTORY — DX: Essential (primary) hypertension: I10

## 2021-11-01 HISTORY — DX: Gastro-esophageal reflux disease without esophagitis: K21.9

## 2021-11-01 HISTORY — DX: Hyperlipidemia, unspecified: E78.5

## 2021-11-01 NOTE — Patient Instructions (Signed)
Your procedure is scheduled on: Friday, December 16 Report to the Registration Desk on the 1st floor of the [facility]. To find out your arrival time, please call [phone] between 1PM - 3PM on: Thursday, December 15  REMEMBER: Instructions that are not followed completely may result in serious medical risk, up to and including death; or upon the discretion of your surgeon and anesthesiologist your surgery may need to be rescheduled.  Do not eat food after midnight the night before surgery.  No gum chewing, lozengers or hard candies.  You may however, drink water up to 2 hours before you are scheduled to arrive for your surgery. Do not drink anything within 2 hours of your scheduled arrival time.  TAKE THESE MEDICATIONS THE MORNING OF SURGERY WITH A SIP OF WATER:  Amlodipine Hydralazine Pregabalin (Lyrica) Primidone  One week prior to surgery: starting December 9 Stop Anti-inflammatories (NSAIDS) such as Advil, Aleve, Ibuprofen, Motrin, Naproxen, Naprosyn and Aspirin based products such as Excedrin, Goodys Powder, BC Powder. Continue taking the 81 mg aspirin. Stop ANY OVER THE COUNTER supplements until after surgery. Stop multiple vitamin. You may however, continue to take Tylenol if needed for pain up until the day of surgery.  No Alcohol for 24 hours before or after surgery.  No Smoking including e-cigarettes for 24 hours prior to surgery.  No chewable tobacco products for at least 6 hours prior to surgery.  No nicotine patches on the day of surgery.  Do not use any "recreational" drugs for at least a week prior to your surgery.  Please be advised that the combination of cocaine and anesthesia may have negative outcomes, up to and including death. If you test positive for cocaine, your surgery will be cancelled.  On the morning of surgery brush your teeth with toothpaste and water, you may rinse your mouth with mouthwash if you wish. Do not swallow any toothpaste or  mouthwash.  Use CHG Soap or wipes as directed on instruction sheet.  Do not wear jewelry, make-up, hairpins, clips or nail polish.  Do not wear lotions, powders, or perfumes.   Do not shave body from the neck down 48 hours prior to surgery just in case you cut yourself which could leave a site for infection.  Also, freshly shaved skin may become irritated if using the CHG soap.  Contact lenses, hearing aids and dentures may not be worn into surgery.  Do not bring valuables to the hospital. [facility] is not responsible for any missing/lost belongings or valuables.   Bring your C-PAP to the hospital with you in case you may have to spend the night.   Notify your doctor if there is any change in your medical condition (cold, fever, infection).  Wear comfortable clothing (specific to your surgery type) to the hospital.  After surgery, you can help prevent lung complications by doing breathing exercises.  Take deep breaths and cough every 1-2 hours. Your doctor may order a device called an Incentive Spirometer to help you take deep breaths. When coughing or sneezing, hold a pillow firmly against your incision with both hands. This is called "splinting." Doing this helps protect your incision. It also decreases belly discomfort.  If you are being admitted to the hospital overnight, leave your suitcase in the car. After surgery it may be brought to your room.  If you are being discharged the day of surgery, you will not be allowed to drive home. You will need a responsible adult (18 years or  older) to drive you home and stay with you that night.   If you are taking public transportation, you will need to have a responsible adult (18 years or older) with you. Please confirm with your physician that it is acceptable to use public transportation.   Please call the [place] Dept. at [phone] if you have any questions about these instructions.  Surgery Visitation  Policy:  Patients undergoing a surgery or procedure may have one family member or support person with them as long as that person is not COVID-19 positive or experiencing its symptoms.  That person may remain in the waiting area during the procedure and may rotate out with other people.  Inpatient Visitation:    Visiting hours are 7 a.m. to 8 p.m. Up to two visitors ages 16+ are allowed at one time in a patient room. The visitors may rotate out with other people during the day. Visitors must check out when they leave, or other visitors will not be allowed. One designated support person may remain overnight. The visitor must pass COVID-19 screenings, use hand sanitizer when entering and exiting the patient's room and wear a mask at all times, including in the patient's room. Patients must also wear a mask when staff or their visitor are in the room. Masking is required regardless of vaccination status.

## 2021-11-01 NOTE — Progress Notes (Signed)
Message left with personnel at [facility] to have the nurse return my call to do pre-op interview. No return call by afternoon. Call back to [facility], after 27 minute on hold waiting for the nurse; decided to make PAT in-person appointment due to patient needing labs and EKG. Appointment made with personnel for Tuesday, December 13 at 10 am.

## 2021-11-06 ENCOUNTER — Encounter
Admission: RE | Admit: 2021-11-06 | Discharge: 2021-11-06 | Disposition: A | Discharge status: Home / Self Care | Payer: Medicaid Other | Source: Ambulatory Visit | Attending: Vascular Surgery | Admitting: Vascular Surgery

## 2021-11-06 ENCOUNTER — Inpatient Hospital Stay: Admission: RE | Admit: 2021-11-06 | Payer: Medicaid Other | Source: Ambulatory Visit

## 2021-11-06 ENCOUNTER — Other Ambulatory Visit: Payer: Self-pay

## 2021-11-06 DIAGNOSIS — N184 Chronic kidney disease, stage 4 (severe): Secondary | ICD-10-CM | POA: Diagnosis not present

## 2021-11-06 DIAGNOSIS — Z01818 Encounter for other preprocedural examination: Secondary | ICD-10-CM | POA: Diagnosis present

## 2021-11-06 HISTORY — DX: Fracture of mandible, unspecified, initial encounter for closed fracture: S02.609A

## 2021-11-06 HISTORY — DX: Unspecified osteoarthritis, unspecified site: M19.90

## 2021-11-06 HISTORY — DX: Idiopathic gout, unspecified site: M10.00

## 2021-11-06 LAB — BASIC METABOLIC PANEL
Anion gap: 7 (ref 5–15)
BUN: 59 mg/dL — ABNORMAL HIGH (ref 8–23)
CO2: 23 mmol/L (ref 22–32)
Calcium: 9.1 mg/dL (ref 8.9–10.3)
Chloride: 106 mmol/L (ref 98–111)
Creatinine, Ser: 3.07 mg/dL — ABNORMAL HIGH (ref 0.61–1.24)
GFR, Estimated: 22 mL/min — ABNORMAL LOW (ref >60)
Glucose, Bld: 100 mg/dL — ABNORMAL HIGH (ref 70–99)
Potassium: 4.4 mmol/L (ref 3.5–5.1)
Sodium: 136 mmol/L (ref 135–145)

## 2021-11-06 LAB — CBC WITH DIFFERENTIAL/PLATELET
Abs Immature Granulocytes: 0.01 10*3/uL (ref 0.00–0.07)
Basophils Absolute: 0 10*3/uL (ref 0.0–0.1)
Basophils Relative: 1 %
Eosinophils Absolute: 0.3 10*3/uL (ref 0.0–0.5)
Eosinophils Relative: 6 %
HCT: 35.9 % — ABNORMAL LOW (ref 39.0–52.0)
Hemoglobin: 11.6 g/dL — ABNORMAL LOW (ref 13.0–17.0)
Immature Granulocytes: 0 %
Lymphocytes Relative: 21 %
Lymphs Abs: 0.9 10*3/uL (ref 0.7–4.0)
MCH: 27.4 pg (ref 26.0–34.0)
MCHC: 32.3 g/dL (ref 30.0–36.0)
MCV: 84.9 fL (ref 80.0–100.0)
Monocytes Absolute: 0.5 10*3/uL (ref 0.1–1.0)
Monocytes Relative: 12 %
Neutro Abs: 2.6 10*3/uL (ref 1.7–7.7)
Neutrophils Relative %: 60 %
Platelets: 194 10*3/uL (ref 150–400)
RBC: 4.23 MIL/uL (ref 4.22–5.81)
RDW: 14.6 % (ref 11.5–15.5)
WBC: 4.3 10*3/uL (ref 4.0–10.5)
nRBC: 0 % (ref 0.0–0.2)

## 2021-11-06 LAB — TYPE AND SCREEN
ABO/RH(D): O POS
Antibody Screen: NEGATIVE

## 2021-11-06 NOTE — Patient Instructions (Addendum)
Your procedure is scheduled on: Friday, December 16 Report to the Registration Desk on the 1st floor of the [facility]. To find out your arrival time, please call [phone] between 1PM - 3PM on: Thursday, December 15  REMEMBER: Instructions that are not followed completely may result in serious medical risk, up to and including death; or upon the discretion of your surgeon and anesthesiologist your surgery may need to be rescheduled.  Do not eat food after midnight the night before surgery.  No gum chewing, lozengers or hard candies.  You may however, drink water up to 2 hours before you are scheduled to arrive for your surgery. Do not drink anything within 2 hours of your scheduled arrival time.  TAKE THESE MEDICATIONS THE MORNING OF SURGERY WITH A SIP OF WATER:  Amlodipine Hydralazine Pregabalin Prmidone  One week prior to surgery: Stop Anti-inflammatories (NSAIDS) such as Advil, Aleve, Ibuprofen, Motrin, Naproxen, Naprosyn and Aspirin based products such as Excedrin, Goodys Powder, BC Powder. Stop ANY OVER THE COUNTER supplements until after surgery. You may however, continue to take Tylenol if needed for pain up until the day of surgery.  No Alcohol for 24 hours before or after surgery.  No Smoking including e-cigarettes for 24 hours prior to surgery.  No chewable tobacco products for at least 6 hours prior to surgery.  No nicotine patches on the day of surgery.  Do not use any "recreational" drugs for at least a week prior to your surgery.  Please be advised that the combination of cocaine and anesthesia may have negative outcomes, up to and including death. If you test positive for cocaine, your surgery will be cancelled.  On the morning of surgery brush your teeth with toothpaste and water, you may rinse your mouth with mouthwash if you wish. Do not swallow any toothpaste or mouthwash.  Use CHG Soap or wipes as directed on instruction sheet.  Do not wear jewelry,  make-up, hairpins, clips or nail polish.  Do not wear lotions, powders, or perfumes.   Do not shave body from the neck down 48 hours prior to surgery just in case you cut yourself which could leave a site for infection.  Also, freshly shaved skin may become irritated if using the CHG soap.  Contact lenses, hearing aids and dentures may not be worn into surgery.  Do not bring valuables to the hospital. [facility] is not responsible for any missing/lost belongings or valuables.   Notify your doctor if there is any change in your medical condition (cold, fever, infection).  Wear comfortable clothing (specific to your surgery type) to the hospital.  After surgery, you can help prevent lung complications by doing breathing exercises.  Take deep breaths and cough every 1-2 hours. Your doctor may order a device called an Incentive Spirometer to help you take deep breaths.  If you are being discharged the day of surgery, you will not be allowed to drive home. You will need a responsible adult (18 years or older) to drive you home and stay with you that night.   If you are taking public transportation, you will need to have a responsible adult (18 years or older) with you. Please confirm with your physician that it is acceptable to use public transportation.   Please call the [place] Dept. at [phone] if you have any questions about these instructions.  Surgery Visitation Policy:  Patients undergoing a surgery or procedure may have one family member or support person with them  as long as that person is not COVID-19 positive or experiencing its symptoms.  That person may remain in the waiting area during the procedure and may rotate out with other people.

## 2021-11-09 ENCOUNTER — Ambulatory Visit: Payer: Medicaid Other | Admitting: Urgent Care

## 2021-11-09 ENCOUNTER — Ambulatory Visit
Admission: RE | Admit: 2021-11-09 | Discharge: 2021-11-09 | Disposition: A | Discharge status: Home / Self Care | Payer: Medicaid Other | Source: Ambulatory Visit | Attending: Vascular Surgery | Admitting: Vascular Surgery

## 2021-11-09 ENCOUNTER — Encounter
Admission: RE | Disposition: A | Discharge status: Home / Self Care | Payer: Self-pay | Source: Ambulatory Visit | Attending: Vascular Surgery

## 2021-11-09 ENCOUNTER — Encounter: Payer: Self-pay | Admitting: Vascular Surgery

## 2021-11-09 ENCOUNTER — Other Ambulatory Visit: Payer: Self-pay

## 2021-11-09 ENCOUNTER — Ambulatory Visit: Payer: Medicaid Other | Admitting: Certified Registered"

## 2021-11-09 DIAGNOSIS — K219 Gastro-esophageal reflux disease without esophagitis: Secondary | ICD-10-CM | POA: Diagnosis not present

## 2021-11-09 DIAGNOSIS — N185 Chronic kidney disease, stage 5: Secondary | ICD-10-CM | POA: Diagnosis not present

## 2021-11-09 DIAGNOSIS — Z87891 Personal history of nicotine dependence: Secondary | ICD-10-CM | POA: Insufficient documentation

## 2021-11-09 DIAGNOSIS — E1122 Type 2 diabetes mellitus with diabetic chronic kidney disease: Secondary | ICD-10-CM | POA: Diagnosis not present

## 2021-11-09 DIAGNOSIS — E782 Mixed hyperlipidemia: Secondary | ICD-10-CM | POA: Diagnosis not present

## 2021-11-09 DIAGNOSIS — I120 Hypertensive chronic kidney disease with stage 5 chronic kidney disease or end stage renal disease: Secondary | ICD-10-CM | POA: Insufficient documentation

## 2021-11-09 DIAGNOSIS — N186 End stage renal disease: Secondary | ICD-10-CM | POA: Insufficient documentation

## 2021-11-09 HISTORY — PX: AV FISTULA PLACEMENT: SHX1204

## 2021-11-09 LAB — ABO/RH: ABO/RH(D): O POS

## 2021-11-09 LAB — GLUCOSE, CAPILLARY
Glucose-Capillary: 83 mg/dL (ref 70–99)
Glucose-Capillary: 98 mg/dL (ref 70–99)

## 2021-11-09 SURGERY — ARTERIOVENOUS (AV) FISTULA CREATION
Anesthesia: General | Laterality: Left

## 2021-11-09 MED ORDER — EPHEDRINE SULFATE 50 MG/ML IJ SOLN
INTRAMUSCULAR | Status: DC | PRN
  Administered 2021-11-09: 10 mg via INTRAVENOUS

## 2021-11-09 MED ORDER — PROMETHAZINE HCL 25 MG/ML IJ SOLN: 6.2500 mg | INTRAMUSCULAR | Status: DC | PRN

## 2021-11-09 MED ORDER — DEXAMETHASONE SODIUM PHOSPHATE 10 MG/ML IJ SOLN
INTRAMUSCULAR | Status: DC | PRN
  Administered 2021-11-09: 5 mg via INTRAVENOUS

## 2021-11-09 MED ORDER — SODIUM CHLORIDE 0.9 % IV SOLN: INTRAVENOUS | Status: DC

## 2021-11-09 MED ORDER — PROPOFOL 10 MG/ML IV BOLUS
INTRAVENOUS | Status: AC
  Filled 2021-11-09: qty 20

## 2021-11-09 MED ORDER — HEMOSTATIC AGENTS (NO CHARGE) OPTIME
TOPICAL | Status: DC | PRN
  Administered 2021-11-09: 1 via TOPICAL

## 2021-11-09 MED ORDER — ORAL CARE MOUTH RINSE: 15.0000 mL | Freq: Once | OROMUCOSAL | Status: AC

## 2021-11-09 MED ORDER — LIDOCAINE HCL (PF) 2 % IJ SOLN
INTRAMUSCULAR | Status: AC
  Filled 2021-11-09: qty 5

## 2021-11-09 MED ORDER — FENTANYL CITRATE (PF) 100 MCG/2ML IJ SOLN
INTRAMUSCULAR | Status: AC
  Filled 2021-11-09: qty 2

## 2021-11-09 MED ORDER — HEPARIN SODIUM (PORCINE) 5000 UNIT/ML IJ SOLN
INTRAMUSCULAR | Status: AC
  Filled 2021-11-09: qty 1

## 2021-11-09 MED ORDER — GLYCOPYRROLATE 0.2 MG/ML IJ SOLN
INTRAMUSCULAR | Status: AC
  Filled 2021-11-09: qty 1

## 2021-11-09 MED ORDER — FENTANYL CITRATE (PF) 100 MCG/2ML IJ SOLN: 25.0000 ug | INTRAMUSCULAR | Status: DC | PRN

## 2021-11-09 MED ORDER — FAMOTIDINE 20 MG PO TABS: 20.0000 mg | ORAL_TABLET | Freq: Once | ORAL | Status: AC

## 2021-11-09 MED ORDER — BUPIVACAINE LIPOSOME 1.3 % IJ SUSP
INTRAMUSCULAR | Status: DC | PRN
  Administered 2021-11-09: 5 mL

## 2021-11-09 MED ORDER — CEFAZOLIN SODIUM-DEXTROSE 2-4 GM/100ML-% IV SOLN
INTRAVENOUS | Status: AC
  Filled 2021-11-09: qty 100

## 2021-11-09 MED ORDER — FAMOTIDINE 20 MG PO TABS
ORAL_TABLET | ORAL | Status: AC
  Administered 2021-11-09: 20 mg via ORAL
  Filled 2021-11-09: qty 1

## 2021-11-09 MED ORDER — FENTANYL CITRATE (PF) 100 MCG/2ML IJ SOLN
INTRAMUSCULAR | Status: DC | PRN
  Administered 2021-11-09: 50 ug via INTRAVENOUS

## 2021-11-09 MED ORDER — CHLORHEXIDINE GLUCONATE CLOTH 2 % EX PADS: 6.0000 | MEDICATED_PAD | Freq: Once | CUTANEOUS | Status: DC

## 2021-11-09 MED ORDER — ONDANSETRON HCL 4 MG/2ML IJ SOLN
INTRAMUSCULAR | Status: AC
  Filled 2021-11-09: qty 2

## 2021-11-09 MED ORDER — EPHEDRINE 5 MG/ML INJ
INTRAVENOUS | Status: AC
  Filled 2021-11-09: qty 5

## 2021-11-09 MED ORDER — SODIUM CHLORIDE 0.9 % IV SOLN
INTRAVENOUS | Status: DC | PRN
  Administered 2021-11-09: 20 mL

## 2021-11-09 MED ORDER — HYDROCODONE-ACETAMINOPHEN 5-325 MG PO TABS
1.0000 | ORAL_TABLET | Freq: Four times a day (QID) | ORAL | 0 refills | Status: DC | PRN
Start: 2021-11-09 — End: 2021-11-26

## 2021-11-09 MED ORDER — LIDOCAINE HCL (CARDIAC) PF 100 MG/5ML IV SOSY
PREFILLED_SYRINGE | INTRAVENOUS | Status: DC | PRN
  Administered 2021-11-09: 60 mg via INTRAVENOUS

## 2021-11-09 MED ORDER — PHENYLEPHRINE HCL-NACL 20-0.9 MG/250ML-% IV SOLN
INTRAVENOUS | Status: DC | PRN
  Administered 2021-11-09: 32 ug/min via INTRAVENOUS

## 2021-11-09 MED ORDER — PROPOFOL 10 MG/ML IV BOLUS
INTRAVENOUS | Status: DC | PRN
  Administered 2021-11-09: 100 mg via INTRAVENOUS

## 2021-11-09 MED ORDER — DEXAMETHASONE SODIUM PHOSPHATE 10 MG/ML IJ SOLN
INTRAMUSCULAR | Status: AC
  Filled 2021-11-09: qty 1

## 2021-11-09 MED ORDER — GLYCOPYRROLATE 0.2 MG/ML IJ SOLN
INTRAMUSCULAR | Status: DC | PRN
  Administered 2021-11-09: .2 mg via INTRAVENOUS

## 2021-11-09 MED ORDER — ONDANSETRON HCL 4 MG/2ML IJ SOLN
INTRAMUSCULAR | Status: DC | PRN
  Administered 2021-11-09: 4 mg via INTRAVENOUS

## 2021-11-09 MED ORDER — CEFAZOLIN SODIUM-DEXTROSE 2-4 GM/100ML-% IV SOLN
2.0000 g | INTRAVENOUS | Status: AC
  Administered 2021-11-09: 2 g via INTRAVENOUS

## 2021-11-09 MED ORDER — CHLORHEXIDINE GLUCONATE 0.12 % MT SOLN: 15.0000 mL | Freq: Once | OROMUCOSAL | Status: AC

## 2021-11-09 MED ORDER — CHLORHEXIDINE GLUCONATE 0.12 % MT SOLN
OROMUCOSAL | Status: AC
  Administered 2021-11-09: 15 mL via OROMUCOSAL
  Filled 2021-11-09: qty 15

## 2021-11-09 SURGICAL SUPPLY — 60 items
APPLIER CLIP 11 MED OPEN (CLIP)
APPLIER CLIP 9.375 SM OPEN (CLIP)
BAG DECANTER FOR FLEXI CONT (MISCELLANEOUS) ×2 IMPLANT
BLADE SURG SZ11 CARB STEEL (BLADE) ×2 IMPLANT
BOOT SUTURE AID YELLOW STND (SUTURE) ×2 IMPLANT
BRUSH SCRUB EZ  4% CHG (MISCELLANEOUS) ×1
BRUSH SCRUB EZ 4% CHG (MISCELLANEOUS) ×1 IMPLANT
CHLORAPREP W/TINT 26 (MISCELLANEOUS) ×2 IMPLANT
CLIP APPLIE 11 MED OPEN (CLIP) IMPLANT
CLIP APPLIE 9.375 SM OPEN (CLIP) IMPLANT
DERMABOND ADVANCED (GAUZE/BANDAGES/DRESSINGS) ×1
DERMABOND ADVANCED .7 DNX12 (GAUZE/BANDAGES/DRESSINGS) ×1 IMPLANT
DRESSING SURGICEL FIBRLLR 1X2 (HEMOSTASIS) ×1 IMPLANT
DRSG OPSITE POSTOP 4X6 (GAUZE/BANDAGES/DRESSINGS) ×1 IMPLANT
DRSG SURGICEL FIBRILLAR 1X2 (HEMOSTASIS) ×2
ELECT CAUTERY BLADE 6.4 (BLADE) ×2 IMPLANT
ELECT REM PT RETURN 9FT ADLT (ELECTROSURGICAL) ×2
ELECTRODE REM PT RTRN 9FT ADLT (ELECTROSURGICAL) ×1 IMPLANT
GAUZE 4X4 16PLY ~~LOC~~+RFID DBL (SPONGE) ×2 IMPLANT
GEL ULTRASOUND 20GR AQUASONIC (MISCELLANEOUS) IMPLANT
GLOVE SURG ENC MOIS LTX SZ7 (GLOVE) ×3 IMPLANT
GLOVE SURG SYN 8.0 (GLOVE) ×2 IMPLANT
GLOVE SURG SYN 8.0 PF PI (GLOVE) ×1 IMPLANT
GLOVE SURG UNDER LTX SZ7.5 (GLOVE) ×3 IMPLANT
GOWN STRL REUS W/ TWL LRG LVL3 (GOWN DISPOSABLE) ×1 IMPLANT
GOWN STRL REUS W/ TWL XL LVL3 (GOWN DISPOSABLE) ×2 IMPLANT
GOWN STRL REUS W/TWL LRG LVL3 (GOWN DISPOSABLE) ×1
GOWN STRL REUS W/TWL XL LVL3 (GOWN DISPOSABLE) ×2
IV NS 500ML (IV SOLUTION) ×1
IV NS 500ML BAXH (IV SOLUTION) ×1 IMPLANT
KIT TURNOVER KIT A (KITS) ×2 IMPLANT
LABEL OR SOLS (LABEL) ×2 IMPLANT
LOOP RED MAXI  1X406MM (MISCELLANEOUS) ×1
LOOP VESSEL MAXI 1X406 RED (MISCELLANEOUS) ×1 IMPLANT
LOOP VESSEL MINI 0.8X406 BLUE (MISCELLANEOUS) ×2 IMPLANT
LOOPS BLUE MINI 0.8X406MM (MISCELLANEOUS) ×1
MANIFOLD NEPTUNE II (INSTRUMENTS) ×2 IMPLANT
NDL FILTER BLUNT 18X1 1/2 (NEEDLE) ×1 IMPLANT
NDL HYPO 30X.5 LL (NEEDLE) IMPLANT
NEEDLE FILTER BLUNT 18X 1/2SAF (NEEDLE) ×1
NEEDLE FILTER BLUNT 18X1 1/2 (NEEDLE) ×1 IMPLANT
NEEDLE HYPO 30X.5 LL (NEEDLE) IMPLANT
PACK EXTREMITY ARMC (MISCELLANEOUS) ×2 IMPLANT
PAD PREP 24X41 OB/GYN DISP (PERSONAL CARE ITEMS) ×2 IMPLANT
STOCKINETTE 48X4 2 PLY STRL (GAUZE/BANDAGES/DRESSINGS) ×1 IMPLANT
STOCKINETTE STRL 4IN 9604848 (GAUZE/BANDAGES/DRESSINGS) ×2 IMPLANT
SUT MNCRL+ 5-0 UNDYED PC-3 (SUTURE) ×1 IMPLANT
SUT MONOCRYL 5-0 (SUTURE) ×1
SUT PROLENE 6 0 BV (SUTURE) ×6 IMPLANT
SUT SILK 2 0 (SUTURE) ×1
SUT SILK 2-0 18XBRD TIE 12 (SUTURE) ×1 IMPLANT
SUT SILK 3 0 (SUTURE) ×1
SUT SILK 3-0 18XBRD TIE 12 (SUTURE) ×1 IMPLANT
SUT SILK 4 0 (SUTURE) ×1
SUT SILK 4-0 18XBRD TIE 12 (SUTURE) ×1 IMPLANT
SUT VIC AB 3-0 SH 27 (SUTURE) ×1
SUT VIC AB 3-0 SH 27X BRD (SUTURE) ×1 IMPLANT
SYR 20ML LL LF (SYRINGE) ×2 IMPLANT
SYR 3ML LL SCALE MARK (SYRINGE) ×2 IMPLANT
WATER STERILE IRR 500ML POUR (IV SOLUTION) ×1 IMPLANT

## 2021-11-09 NOTE — Transfer of Care (Signed)
Immediate Anesthesia Transfer of Care Note  Patient: [name]  Procedure(s) Performed: ARTERIOVENOUS (AV) FISTULA CREATION (Left)  Patient Location: PACU  Anesthesia Type:General  Level of Consciousness: awake, drowsy and patient cooperative  Airway & Oxygen Therapy: Patient Spontanous Breathing and Patient connected to face mask oxygen  Post-op Assessment: Report given to RN and Post -op Vital signs reviewed and stable  Post vital signs: Reviewed and stable  Last Vitals:  Vitals Value Taken Time  BP 148/91 (106)   Temp    Pulse 83 11/09/21 1149  Resp 23 11/09/21 1150  SpO2 100 % 11/09/21 1149  Vitals shown include unvalidated device data.  Last Pain:  Vitals:   11/09/21 0749  TempSrc: Oral  PainSc: 0-No pain         Complications: No notable events documented.

## 2021-11-09 NOTE — Anesthesia Postprocedure Evaluation (Signed)
Anesthesia Post Note  Patient: [name]  Procedure(s) Performed: ARTERIOVENOUS (AV) FISTULA CREATION (Left)  Patient location during evaluation: PACU Anesthesia Type: General Level of consciousness: awake and alert Pain management: pain level controlled Vital Signs Assessment: post-procedure vital signs reviewed and stable Respiratory status: spontaneous breathing, nonlabored ventilation, respiratory function stable and patient connected to nasal cannula oxygen Cardiovascular status: blood pressure returned to baseline and stable Postop Assessment: no apparent nausea or vomiting Anesthetic complications: no   No notable events documented.   Last Vitals:  Vitals:   11/09/21 1438 11/09/21 1515  BP: (!) 150/83 (!) 149/77  Pulse: 69 63  Resp: 18 16  Temp: (!) 36.4 C   SpO2: 99% 99%    Last Pain:  Vitals:   11/09/21 1515  TempSrc:   PainSc: 0-No pain                 Martha Clan

## 2021-11-09 NOTE — Op Note (Signed)
° ° ° °  OPERATIVE NOTE   PROCEDURE: left brachial cephalic arteriovenous fistula placement  PRE-OPERATIVE DIAGNOSIS: End Stage Renal Disease  POST-OPERATIVE DIAGNOSIS: End Stage Renal Disease  SURGEON: Hortencia Pilar  ASSISTANT(S): Ms. Hezzie Bump  ANESTHESIA: general  ESTIMATED BLOOD LOSS: <50 cc  FINDING(S): 4 mm cephalic vein  SPECIMEN(S):  none  INDICATIONS:   [name] is a 60 y.o. male who presents with end stage renal disease.  The patient is scheduled for left brachiocephalic arteriovenous fistula placement.  The patient is aware the risks include but are not limited to: bleeding, infection, steal syndrome, nerve damage, ischemic monomelic neuropathy, failure to mature, and need for additional procedures.  The patient is aware of the risks of the procedure and elects to proceed forward.  DESCRIPTION: After full informed written consent was obtained from the patient, the patient was brought back to the operating room and placed supine upon the operating table.  Prior to induction, the patient received IV antibiotics.   After obtaining adequate anesthesia, the patient was then prepped and draped in the standard fashion for a left arm access procedure.   A first assistant was required to provide a safe and appropriate environment for executing the surgery.  The assistant was integral in providing retraction, exposure, running suture providing suction and in the closing process.   A curvilinear incision was then created midway between the radial impulse and the cephalic vein. The cephalic vein was then identified and dissected circumferentially. It was marked with a surgical marker.    Attention was then turned to the brachial artery which was exposed through the same incision and looped proximally and distally. Side branches were controlled with 4-0 silk ties.  The distal segment of the vein was ligated with a  2-0 silk, and the vein was transected.  The proximal  segment was interrogated with serial dilators.  The vein accepted up to a 4 mm dilator without any difficulty. Heparinized saline was infused into the vein and clamped it with a small bulldog.  At this point, I reset my exposure of the brachial artery and controlled the artery with vessel loops proximally and distally.  An arteriotomy was then made with a #11 blade, and extended with a Potts scissor.  Heparinized saline was injected proximal and distal into the radial artery.  The vein was then approximated to the artery while the artery was in its native bed and subsequently the vein was beveled using Potts scissors. The vein was then sewn to the artery in an end-to-side configuration with a running stitch of 6-0 Prolene.  Prior to completing this anastomosis Flushing maneuvers were performed and the artery was allowed to forward and back bleed.  There was no evidence of clot from any vessels.  I completed the anastomosis in the usual fashion and then released all vessel loops and clamps.    There was good  thrill in the venous outflow, and there was 1+ palpable radial pulse.  At this point, I irrigated out the surgical wound.  There was no further active bleeding.  The subcutaneous tissue was reapproximated with a running stitch of 3-0 Vicryl.  The skin was then reapproximated with a running subcuticular stitch of 4-0 Vicryl.  The skin was then cleaned, dried, and reinforced with Dermabond.    The patient tolerated this procedure well.   COMPLICATIONS: None  CONDITION: [name] [facility]  Office: [phone]   11/09/2021, 11:49 AM

## 2021-11-09 NOTE — Discharge Instructions (Addendum)

## 2021-11-09 NOTE — Anesthesia Procedure Notes (Signed)
Procedure Name: LMA Insertion Date/Time: 11/09/2021 10:16 AM Performed by: Jerrye Noble, CRNA Pre-anesthesia Checklist: Patient identified, Emergency Drugs available, Suction available and Patient being monitored Patient Re-evaluated:Patient Re-evaluated prior to induction Oxygen Delivery Method: Circle system utilized Preoxygenation: Pre-oxygenation with 100% oxygen Induction Type: IV induction Ventilation: Mask ventilation without difficulty LMA: LMA inserted LMA Size: 4.5 Number of attempts: 1 Placement Confirmation: positive ETCO2 and breath sounds checked- equal and bilateral Tube secured with: Tape Dental Injury: Teeth and Oropharynx as per pre-operative assessment
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
## 2021-11-09 NOTE — Interval H&P Note (Signed)
History and Physical Interval Note:  11/09/2021 9:29 AM  [name]  has presented today for surgery, with the diagnosis of ESRD.  The various methods of treatment have been discussed with the patient and family. After consideration of risks, benefits and other options for treatment, the patient has consented to  Procedure(s): ARTERIOVENOUS (AV) FISTULA CREATION (Left) as a surgical intervention.  The patient's history has been reviewed, patient examined, no change in status, stable for surgery.  I have reviewed the patient's chart and labs.  Questions were answered to the patient's satisfaction.     Hortencia Pilar

## 2021-11-09 NOTE — OR Nursing (Signed)
MAY RESUME ASPIRIN TODAY PER DR. SCHNIER SECURE-CHAT.  ADDED TO D/C INSTRUCTIONS/MED SECTION.

## 2021-11-09 NOTE — OR Nursing (Signed)
Discharge has been pending transportation.

## 2021-11-10 ENCOUNTER — Encounter: Payer: Self-pay | Admitting: Vascular Surgery

## 2021-11-20 ENCOUNTER — Inpatient Hospital Stay
Admission: EM | Admit: 2021-11-20 | Discharge: 2021-11-26 | DRG: 871 | Disposition: A | Discharge status: Skilled Nursing Facility | Payer: Medicaid Other | Source: Skilled Nursing Facility | Attending: Internal Medicine | Admitting: Internal Medicine

## 2021-11-20 ENCOUNTER — Emergency Department: Payer: Medicaid Other

## 2021-11-20 ENCOUNTER — Other Ambulatory Visit: Payer: Self-pay

## 2021-11-20 DIAGNOSIS — E872 Acidosis, unspecified: Secondary | ICD-10-CM | POA: Diagnosis present

## 2021-11-20 DIAGNOSIS — F17210 Nicotine dependence, cigarettes, uncomplicated: Secondary | ICD-10-CM | POA: Diagnosis present

## 2021-11-20 DIAGNOSIS — E119 Type 2 diabetes mellitus without complications: Secondary | ICD-10-CM

## 2021-11-20 DIAGNOSIS — N179 Acute kidney failure, unspecified: Secondary | ICD-10-CM

## 2021-11-20 DIAGNOSIS — Z20822 Contact with and (suspected) exposure to covid-19: Secondary | ICD-10-CM | POA: Diagnosis present

## 2021-11-20 DIAGNOSIS — R140 Abdominal distension (gaseous): Secondary | ICD-10-CM

## 2021-11-20 DIAGNOSIS — R10819 Abdominal tenderness, unspecified site: Secondary | ICD-10-CM

## 2021-11-20 DIAGNOSIS — D631 Anemia in chronic kidney disease: Secondary | ICD-10-CM | POA: Diagnosis present

## 2021-11-20 DIAGNOSIS — A419 Sepsis, unspecified organism: Secondary | ICD-10-CM | POA: Diagnosis present

## 2021-11-20 DIAGNOSIS — D509 Iron deficiency anemia, unspecified: Secondary | ICD-10-CM | POA: Diagnosis present

## 2021-11-20 DIAGNOSIS — Z8619 Personal history of other infectious and parasitic diseases: Secondary | ICD-10-CM

## 2021-11-20 DIAGNOSIS — J849 Interstitial pulmonary disease, unspecified: Secondary | ICD-10-CM | POA: Diagnosis present

## 2021-11-20 DIAGNOSIS — N184 Chronic kidney disease, stage 4 (severe): Secondary | ICD-10-CM | POA: Diagnosis present

## 2021-11-20 DIAGNOSIS — N136 Pyonephrosis: Secondary | ICD-10-CM | POA: Diagnosis present

## 2021-11-20 DIAGNOSIS — N390 Urinary tract infection, site not specified: Secondary | ICD-10-CM | POA: Diagnosis present

## 2021-11-20 DIAGNOSIS — I129 Hypertensive chronic kidney disease with stage 1 through stage 4 chronic kidney disease, or unspecified chronic kidney disease: Secondary | ICD-10-CM | POA: Diagnosis present

## 2021-11-20 DIAGNOSIS — G9341 Metabolic encephalopathy: Secondary | ICD-10-CM

## 2021-11-20 DIAGNOSIS — J189 Pneumonia, unspecified organism: Secondary | ICD-10-CM

## 2021-11-20 DIAGNOSIS — B961 Klebsiella pneumoniae [K. pneumoniae] as the cause of diseases classified elsewhere: Secondary | ICD-10-CM | POA: Diagnosis present

## 2021-11-20 DIAGNOSIS — R338 Other retention of urine: Secondary | ICD-10-CM

## 2021-11-20 DIAGNOSIS — R7881 Bacteremia: Secondary | ICD-10-CM

## 2021-11-20 DIAGNOSIS — E875 Hyperkalemia: Secondary | ICD-10-CM

## 2021-11-20 DIAGNOSIS — R6520 Severe sepsis without septic shock: Secondary | ICD-10-CM | POA: Diagnosis present

## 2021-11-20 DIAGNOSIS — A4159 Other Gram-negative sepsis: Principal | ICD-10-CM | POA: Diagnosis present

## 2021-11-20 DIAGNOSIS — E1122 Type 2 diabetes mellitus with diabetic chronic kidney disease: Secondary | ICD-10-CM | POA: Diagnosis present

## 2021-11-20 DIAGNOSIS — I10 Essential (primary) hypertension: Secondary | ICD-10-CM | POA: Diagnosis present

## 2021-11-20 LAB — CBC WITH DIFFERENTIAL/PLATELET
Abs Immature Granulocytes: 0.05 10*3/uL (ref 0.00–0.07)
Basophils Absolute: 0 10*3/uL (ref 0.0–0.1)
Basophils Relative: 0 %
Eosinophils Absolute: 0 10*3/uL (ref 0.0–0.5)
Eosinophils Relative: 0 %
HCT: 34 % — ABNORMAL LOW (ref 39.0–52.0)
Hemoglobin: 11 g/dL — ABNORMAL LOW (ref 13.0–17.0)
Immature Granulocytes: 1 %
Lymphocytes Relative: 11 %
Lymphs Abs: 1.1 10*3/uL (ref 0.7–4.0)
MCH: 27.2 pg (ref 26.0–34.0)
MCHC: 32.4 g/dL (ref 30.0–36.0)
MCV: 84.2 fL (ref 80.0–100.0)
Monocytes Absolute: 1.2 10*3/uL — ABNORMAL HIGH (ref 0.1–1.0)
Monocytes Relative: 12 %
Neutro Abs: 8 10*3/uL — ABNORMAL HIGH (ref 1.7–7.7)
Neutrophils Relative %: 76 %
Platelets: 175 10*3/uL (ref 150–400)
RBC: 4.04 MIL/uL — ABNORMAL LOW (ref 4.22–5.81)
RDW: 14.4 % (ref 11.5–15.5)
WBC: 10.3 10*3/uL (ref 4.0–10.5)
nRBC: 0 % (ref 0.0–0.2)

## 2021-11-20 LAB — COMPREHENSIVE METABOLIC PANEL
ALT: 43 U/L (ref 0–44)
AST: 60 U/L — ABNORMAL HIGH (ref 15–41)
Albumin: 3.3 g/dL — ABNORMAL LOW (ref 3.5–5.0)
Alkaline Phosphatase: 51 U/L (ref 38–126)
Anion gap: 11 (ref 5–15)
BUN: 94 mg/dL — ABNORMAL HIGH (ref 8–23)
CO2: 20 mmol/L — ABNORMAL LOW (ref 22–32)
Calcium: 9.1 mg/dL (ref 8.9–10.3)
Chloride: 100 mmol/L (ref 98–111)
Creatinine, Ser: 6.76 mg/dL — ABNORMAL HIGH (ref 0.61–1.24)
GFR, Estimated: 9 mL/min — ABNORMAL LOW (ref >60)
Glucose, Bld: 161 mg/dL — ABNORMAL HIGH (ref 70–99)
Potassium: 5 mmol/L (ref 3.5–5.1)
Sodium: 131 mmol/L — ABNORMAL LOW (ref 135–145)
Total Bilirubin: 0.7 mg/dL (ref 0.3–1.2)
Total Protein: 7.4 g/dL (ref 6.5–8.1)

## 2021-11-20 LAB — LACTIC ACID, PLASMA
Lactic Acid, Venous: 1.4 mmol/L (ref 0.5–1.9)
Lactic Acid, Venous: 1.1 mmol/L (ref 0.5–1.9)

## 2021-11-20 LAB — RESP PANEL BY RT-PCR (FLU A&B, COVID) ARPGX2
Influenza A by PCR: NEGATIVE
Influenza B by PCR: NEGATIVE
SARS Coronavirus 2 by RT PCR: NEGATIVE

## 2021-11-20 MED ORDER — ACETAMINOPHEN 325 MG PO TABS
650.0000 mg | ORAL_TABLET | Freq: Once | ORAL | Status: AC | PRN
  Administered 2021-11-20: 13:00:00 650 mg via ORAL
  Filled 2021-11-20: qty 2

## 2021-11-20 MED ORDER — SODIUM CHLORIDE 0.9 % IV BOLUS
1000.0000 mL | Freq: Once | INTRAVENOUS | Status: AC
  Administered 2021-11-20: 21:00:00 1000 mL via INTRAVENOUS

## 2021-11-20 MED ORDER — SODIUM CHLORIDE 0.9 % IV SOLN
500.0000 mg | Freq: Once | INTRAVENOUS | Status: AC
  Administered 2021-11-21: 500 mg via INTRAVENOUS
  Filled 2021-11-20: qty 5

## 2021-11-20 MED ORDER — LACTATED RINGERS IV BOLUS
1000.0000 mL | Freq: Once | INTRAVENOUS | Status: AC
  Administered 2021-11-21: 1000 mL via INTRAVENOUS

## 2021-11-20 MED ORDER — SODIUM CHLORIDE 0.9 % IV SOLN
1.0000 g | Freq: Once | INTRAVENOUS | Status: AC
  Administered 2021-11-20: 1 g via INTRAVENOUS
  Filled 2021-11-20: qty 10
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
## 2021-11-20 NOTE — ED Triage Notes (Signed)
Pt to ED from [facility] for fever, pt febrile on arrival. Pt answering orientation questions appropriately. States he is here to be "checked out"

## 2021-11-20 NOTE — ED Provider Notes (Signed)
Emergency Medicine Provider Triage Evaluation Note  [name] , a 60 y.o. male  was evaluated in triage.  Pt complains of fever and AMS. He presents from [facility]. Patient is unable to give reliable history.  .  Review of Systems  Positive: Fever, AMS Negative: NVD  Physical Exam  BP 119/73    Pulse (!) 117    Temp (!) 102.9 F (39.4 C) (Oral)    Resp (!) 22    Ht 6\' 1"  (1.854 m)    Wt 76 kg    SpO2 93%    BMI 22.11 kg/m  Gen:   Awake, no distress  NAD Resp:  Normal effort CTA MSK:   Moves extremities without difficulty  Other:  CVS: RRR  Medical Decision Making  Medically screening exam initiated at 4:07 PM.  Appropriate orders placed.  ELDEAN NANNA was informed that the remainder of the evaluation will be completed by another provider, this initial triage assessment does not replace that evaluation, and the importance of remaining in the ED until their evaluation is complete.  Patient presenting to the ED from [facility] via EMS for evaluation of fever and altered mental status.   Melvenia Needles, PA-C 11/20/21 1634    Nance Pear, MD 11/20/21 [phone]

## 2021-11-21 ENCOUNTER — Encounter: Payer: Self-pay | Admitting: Vascular Surgery

## 2021-11-21 ENCOUNTER — Encounter
Admission: EM | Disposition: A | Discharge status: Skilled Nursing Facility | Payer: Self-pay | Source: Skilled Nursing Facility | Attending: Family Medicine

## 2021-11-21 DIAGNOSIS — N184 Chronic kidney disease, stage 4 (severe): Secondary | ICD-10-CM | POA: Diagnosis present

## 2021-11-21 DIAGNOSIS — G9341 Metabolic encephalopathy: Secondary | ICD-10-CM | POA: Diagnosis present

## 2021-11-21 DIAGNOSIS — B961 Klebsiella pneumoniae [K. pneumoniae] as the cause of diseases classified elsewhere: Secondary | ICD-10-CM | POA: Diagnosis present

## 2021-11-21 DIAGNOSIS — D509 Iron deficiency anemia, unspecified: Secondary | ICD-10-CM | POA: Diagnosis present

## 2021-11-21 DIAGNOSIS — N136 Pyonephrosis: Secondary | ICD-10-CM | POA: Diagnosis present

## 2021-11-21 DIAGNOSIS — N179 Acute kidney failure, unspecified: Secondary | ICD-10-CM | POA: Diagnosis not present

## 2021-11-21 DIAGNOSIS — R338 Other retention of urine: Secondary | ICD-10-CM | POA: Diagnosis not present

## 2021-11-21 DIAGNOSIS — J189 Pneumonia, unspecified organism: Secondary | ICD-10-CM

## 2021-11-21 DIAGNOSIS — R6520 Severe sepsis without septic shock: Secondary | ICD-10-CM

## 2021-11-21 DIAGNOSIS — I129 Hypertensive chronic kidney disease with stage 1 through stage 4 chronic kidney disease, or unspecified chronic kidney disease: Secondary | ICD-10-CM | POA: Diagnosis present

## 2021-11-21 DIAGNOSIS — Z20822 Contact with and (suspected) exposure to covid-19: Secondary | ICD-10-CM | POA: Diagnosis present

## 2021-11-21 DIAGNOSIS — I10 Essential (primary) hypertension: Secondary | ICD-10-CM

## 2021-11-21 DIAGNOSIS — A419 Sepsis, unspecified organism: Secondary | ICD-10-CM | POA: Diagnosis not present

## 2021-11-21 DIAGNOSIS — D631 Anemia in chronic kidney disease: Secondary | ICD-10-CM | POA: Diagnosis present

## 2021-11-21 DIAGNOSIS — N189 Chronic kidney disease, unspecified: Secondary | ICD-10-CM

## 2021-11-21 DIAGNOSIS — N390 Urinary tract infection, site not specified: Secondary | ICD-10-CM | POA: Diagnosis present

## 2021-11-21 DIAGNOSIS — E872 Acidosis, unspecified: Secondary | ICD-10-CM | POA: Diagnosis present

## 2021-11-21 DIAGNOSIS — E1122 Type 2 diabetes mellitus with diabetic chronic kidney disease: Secondary | ICD-10-CM | POA: Diagnosis present

## 2021-11-21 DIAGNOSIS — E875 Hyperkalemia: Secondary | ICD-10-CM | POA: Diagnosis present

## 2021-11-21 DIAGNOSIS — Z8619 Personal history of other infectious and parasitic diseases: Secondary | ICD-10-CM | POA: Diagnosis not present

## 2021-11-21 DIAGNOSIS — A4159 Other Gram-negative sepsis: Secondary | ICD-10-CM | POA: Diagnosis present

## 2021-11-21 DIAGNOSIS — R7881 Bacteremia: Secondary | ICD-10-CM | POA: Diagnosis not present

## 2021-11-21 DIAGNOSIS — J849 Interstitial pulmonary disease, unspecified: Secondary | ICD-10-CM | POA: Diagnosis present

## 2021-11-21 DIAGNOSIS — F17210 Nicotine dependence, cigarettes, uncomplicated: Secondary | ICD-10-CM | POA: Diagnosis present

## 2021-11-21 HISTORY — PX: TEMPORARY DIALYSIS CATHETER: CATH118312

## 2021-11-21 LAB — BLOOD CULTURE ID PANEL (REFLEXED) - BCID2

## 2021-11-21 LAB — BASIC METABOLIC PANEL
Anion gap: 13 (ref 5–15)
BUN: 105 mg/dL — ABNORMAL HIGH (ref 8–23)
CO2: 16 mmol/L — ABNORMAL LOW (ref 22–32)
Calcium: 8.6 mg/dL — ABNORMAL LOW (ref 8.9–10.3)
Chloride: 106 mmol/L (ref 98–111)
Creatinine, Ser: 7.49 mg/dL — ABNORMAL HIGH (ref 0.61–1.24)
GFR, Estimated: 8 mL/min — ABNORMAL LOW (ref >60)
Glucose, Bld: 125 mg/dL — ABNORMAL HIGH (ref 70–99)
Potassium: 5.7 mmol/L — ABNORMAL HIGH (ref 3.5–5.1)
Sodium: 135 mmol/L (ref 135–145)

## 2021-11-21 LAB — URINALYSIS, ROUTINE W REFLEX MICROSCOPIC
Bilirubin Urine: NEGATIVE
Glucose, UA: NEGATIVE mg/dL
Ketones, ur: NEGATIVE mg/dL
Nitrite: POSITIVE — AB
Protein, ur: 100 mg/dL — AB
Specific Gravity, Urine: 1.01 (ref 1.005–1.030)
WBC, UA: >50 WBC/hpf — ABNORMAL HIGH (ref 0–5)
pH: 6 (ref 5.0–8.0)

## 2021-11-21 LAB — HIV ANTIBODY (ROUTINE TESTING W REFLEX): HIV Screen 4th Generation wRfx: NONREACTIVE

## 2021-11-21 LAB — HEPATITIS B SURFACE ANTIGEN: Hepatitis B Surface Ag: NONREACTIVE

## 2021-11-21 LAB — CBC
HCT: 29.1 % — ABNORMAL LOW (ref 39.0–52.0)
Hemoglobin: 9.6 g/dL — ABNORMAL LOW (ref 13.0–17.0)
MCH: 27.1 pg (ref 26.0–34.0)
MCHC: 33 g/dL (ref 30.0–36.0)
MCV: 82.2 fL (ref 80.0–100.0)
Platelets: 154 10*3/uL (ref 150–400)
RBC: 3.54 MIL/uL — ABNORMAL LOW (ref 4.22–5.81)
RDW: 14.7 % (ref 11.5–15.5)
WBC: 13.3 10*3/uL — ABNORMAL HIGH (ref 4.0–10.5)
nRBC: 0 % (ref 0.0–0.2)

## 2021-11-21 LAB — CORTISOL-AM, BLOOD: Cortisol - AM: 27.9 ug/dL — ABNORMAL HIGH (ref 6.7–22.6)

## 2021-11-21 LAB — IRON AND TIBC
Iron: 11 ug/dL — ABNORMAL LOW (ref 45–182)
Saturation Ratios: 7 % — ABNORMAL LOW (ref 17.9–39.5)
TIBC: 167 ug/dL — ABNORMAL LOW (ref 250–450)
UIBC: 156 ug/dL

## 2021-11-21 LAB — PROCALCITONIN: Procalcitonin: 54.95 ng/mL

## 2021-11-21 LAB — HEPATITIS B CORE ANTIBODY, IGM: Hep B C IgM: NONREACTIVE

## 2021-11-21 LAB — HEPATITIS B SURFACE ANTIBODY,QUALITATIVE: Hep B S Ab: REACTIVE — AB

## 2021-11-21 LAB — PROTIME-INR
INR: 1.3 — ABNORMAL HIGH (ref 0.8–1.2)
Prothrombin Time: 16.6 seconds — ABNORMAL HIGH (ref 11.4–15.2)

## 2021-11-21 LAB — HEPATITIS C ANTIBODY: HCV Ab: REACTIVE — AB

## 2021-11-21 LAB — HEPATITIS B CORE ANTIBODY, TOTAL: Hep B Core Total Ab: REACTIVE — AB

## 2021-11-21 LAB — PHOSPHORUS: Phosphorus: 4.3 mg/dL (ref 2.5–4.6)

## 2021-11-21 LAB — FERRITIN: Ferritin: 596 ng/mL — ABNORMAL HIGH (ref 24–336)

## 2021-11-21 SURGERY — TEMPORARY DIALYSIS CATHETER
Anesthesia: Moderate Sedation

## 2021-11-21 MED ORDER — SODIUM ZIRCONIUM CYCLOSILICATE 10 G PO PACK
10.0000 g | PACK | Freq: Once | ORAL | Status: AC
  Administered 2021-11-21: 10:00:00 10 g via ORAL
  Filled 2021-11-21: qty 1

## 2021-11-21 MED ORDER — HEPARIN SODIUM (PORCINE) 5000 UNIT/ML IJ SOLN
5000.0000 [IU] | Freq: Three times a day (TID) | INTRAMUSCULAR | Status: DC
  Administered 2021-11-21 – 2021-11-26 (×15): 5000 [IU] via SUBCUTANEOUS
  Filled 2021-11-21 (×16): qty 1

## 2021-11-21 MED ORDER — ACETAMINOPHEN 325 MG PO TABS
ORAL_TABLET | ORAL | Status: AC
  Administered 2021-11-21: 650 mg via ORAL
  Filled 2021-11-21: qty 2

## 2021-11-21 MED ORDER — SODIUM CHLORIDE 0.9 % IV SOLN
2.0000 g | INTRAVENOUS | Status: DC
  Administered 2021-11-21: 12:00:00 2 g via INTRAVENOUS
  Filled 2021-11-21: qty 20

## 2021-11-21 MED ORDER — SODIUM CHLORIDE 0.9 % IV SOLN
500.0000 mg | INTRAVENOUS | Status: AC
  Administered 2021-11-21 – 2021-11-24 (×4): 500 mg via INTRAVENOUS
  Filled 2021-11-21: qty 500
  Filled 2021-11-21 (×2): qty 5
  Filled 2021-11-21: qty 500

## 2021-11-21 MED ORDER — HEPARIN SODIUM (PORCINE) 1000 UNIT/ML IJ SOLN
INTRAMUSCULAR | Status: AC
  Filled 2021-11-21: qty 10

## 2021-11-21 MED ORDER — ONDANSETRON HCL 4 MG PO TABS: 4.0000 mg | ORAL_TABLET | Freq: Four times a day (QID) | ORAL | Status: DC | PRN

## 2021-11-21 MED ORDER — SODIUM CHLORIDE 0.9 % IV SOLN: INTRAVENOUS | Status: DC

## 2021-11-21 MED ORDER — ONDANSETRON HCL 4 MG/2ML IJ SOLN: 4.0000 mg | Freq: Four times a day (QID) | INTRAMUSCULAR | Status: DC | PRN

## 2021-11-21 MED ORDER — SODIUM CHLORIDE 0.9 % IV SOLN
2.0000 g | INTRAVENOUS | Status: DC
  Administered 2021-11-22: 12:00:00 2 g via INTRAVENOUS
  Filled 2021-11-21 (×2): qty 20

## 2021-11-21 MED ORDER — ASPIRIN EC 81 MG PO TBEC
81.0000 mg | DELAYED_RELEASE_TABLET | Freq: Every day | ORAL | Status: DC
  Administered 2021-11-21 – 2021-11-26 (×6): 81 mg via ORAL
  Filled 2021-11-21 (×6): qty 1

## 2021-11-21 MED ORDER — ACETAMINOPHEN 325 MG PO TABS
650.0000 mg | ORAL_TABLET | Freq: Four times a day (QID) | ORAL | Status: DC | PRN
  Administered 2021-11-21 – 2021-11-22 (×4): 650 mg via ORAL
  Filled 2021-11-21 (×4): qty 2

## 2021-11-21 MED ORDER — ACETAMINOPHEN 650 MG RE SUPP: 650.0000 mg | Freq: Four times a day (QID) | RECTAL | Status: DC | PRN

## 2021-11-21 MED ORDER — LINEZOLID 600 MG/300ML IV SOLN
600.0000 mg | Freq: Two times a day (BID) | INTRAVENOUS | Status: DC
  Administered 2021-11-21: 10:00:00 600 mg via INTRAVENOUS
  Filled 2021-11-21 (×3): qty 300

## 2021-11-21 MED ORDER — CHLORHEXIDINE GLUCONATE CLOTH 2 % EX PADS
6.0000 | MEDICATED_PAD | Freq: Every day | CUTANEOUS | Status: DC
  Administered 2021-11-22 – 2021-11-26 (×5): 6 via TOPICAL

## 2021-11-21 SURGICAL SUPPLY — 2 items
KIT DIALYSIS CATH TRI 30X13 (CATHETERS) ×1 IMPLANT
PACK ANGIOGRAPHY (CUSTOM PROCEDURE TRAY) ×2 IMPLANT

## 2021-11-21 NOTE — Assessment & Plan Note (Addendum)
Secondary to kidney function. Given Lokelma 10 g x1. Management per HD.

## 2021-11-21 NOTE — Assessment & Plan Note (Addendum)
Chest x-ray with possible LL lung interstitial pneumonia. Started empirically on Azithromycin. On Ceftriaxone for UTI. No symptoms. Treated empirically with 3 days of Ceftriaxone and Azithromycin.

## 2021-11-21 NOTE — Op Note (Signed)
°  OPERATIVE NOTE   PROCEDURE: Ultrasound guidance for vascular access right femoral vein Placement of a 30 cm triple lumen dialysis catheter right femoral vein  PRE-OPERATIVE DIAGNOSIS: 1. Renal failure, sepsis  POST-OPERATIVE DIAGNOSIS: Same  SURGEON: Leotis Pain, MD  ASSISTANT(S): None  ANESTHESIA: local  ESTIMATED BLOOD LOSS: Minimal   FINDING(S): 1.  None  SPECIMEN(S):  None  INDICATIONS:    Patient is a 60 y.o.male who presents with progression of his renal failure and need for dialysis.  With his sepsis, a permcath can not be placed and he will need a temp cath.  Risks and benefits were discussed, but sister could not be reached.  Dr. Juleen China and I both agree with proceeding out of medical necessity.  DESCRIPTION: After obtaining full informed written consent, the patient was laid flat in the bed.  The right groin was sterilely prepped and draped in a sterile surgical field was created. The right femoral vein was visualized with ultrasound and found to be widely patent. It was then accessed under direct guidance without difficulty with a Seldinger needle and a permanent image was recorded. A J-wire was then placed. After skin nick and dilatation, a 30 cm triple lumen dialysis catheter was placed over the wire and the wire was removed. The lumens withdrew dark red nonpulsatile blood and flushed easily with sterile saline. The catheter was secured to the skin with 3 nylon sutures. Sterile dressing was placed.  COMPLICATIONS: None  CONDITION: Stable  Leotis Pain 11/21/2021 2:00 PM  This note was created with Dragon Medical transcription system. Any errors in dictation are purely unintentional.

## 2021-11-21 NOTE — Assessment & Plan Note (Deleted)
See problem, UTI (urinary tract infection)

## 2021-11-21 NOTE — Hospital Course (Addendum)
[name] is a 60 y.o. male with medical history significant for DM, HTN, CKD stage IV s/p left brachial AV fistula placement on 11/09/2021 in preparation for dialysis. Patient presented secondary to altered mental status and fever, meeting severe sepsis criteria with evidence of UTI and possible pneumonia. Empiric antibiotics initiated. Blood and urine cultures obtained and are significant for Klebsiella infection. Continue IV antibiotics. Patient also found to have significant urinary retention; urinary foley catheter placed.

## 2021-11-21 NOTE — Progress Notes (Signed)
PHARMACY - PHYSICIAN COMMUNICATION CRITICAL VALUE ALERT - BLOOD CULTURE IDENTIFICATION (BCID)  [name] is an 61 y.o. male who presented to [facility] on 11/20/2021 with a chief complaint of AMS and fever.  Assessment:  4/4 bottles Kleb pneumo. No resistance detected.   Name of physician (or Provider) Contacted: Dr. Lonny Prude  Current antibiotics: Ceftriaxone, azithromycin, linezolid  Changes to prescribed antibiotics recommended:  Patient is on recommended antibiotics with ceftriaxone. MD would like to keep azithromycin as well for PNA.  Discontinue linezolid.   Results for orders placed or performed during the hospital encounter of 11/20/21  Blood Culture ID Panel (Reflexed) (Collected: 11/20/2021 11:32 PM)  Result Value Ref Range   Enterococcus faecalis NOT DETECTED NOT DETECTED   Enterococcus Faecium NOT DETECTED NOT DETECTED   Listeria monocytogenes NOT DETECTED NOT DETECTED   Staphylococcus species NOT DETECTED NOT DETECTED   Staphylococcus aureus (BCID) NOT DETECTED NOT DETECTED   Staphylococcus epidermidis NOT DETECTED NOT DETECTED   Staphylococcus lugdunensis NOT DETECTED NOT DETECTED   Streptococcus species NOT DETECTED NOT DETECTED   Streptococcus agalactiae NOT DETECTED NOT DETECTED   Streptococcus pneumoniae NOT DETECTED NOT DETECTED   Streptococcus pyogenes NOT DETECTED NOT DETECTED   A.calcoaceticus-baumannii NOT DETECTED NOT DETECTED   Bacteroides fragilis NOT DETECTED NOT DETECTED   Enterobacterales DETECTED (A) NOT DETECTED   Enterobacter cloacae complex NOT DETECTED NOT DETECTED   Escherichia coli NOT DETECTED NOT DETECTED   Klebsiella aerogenes NOT DETECTED NOT DETECTED   Klebsiella oxytoca NOT DETECTED NOT DETECTED   Klebsiella pneumoniae DETECTED (A) NOT DETECTED   Proteus species NOT DETECTED NOT DETECTED   Salmonella species NOT DETECTED NOT DETECTED   Serratia marcescens NOT DETECTED NOT DETECTED   Haemophilus influenzae NOT DETECTED NOT DETECTED    Neisseria meningitidis NOT DETECTED NOT DETECTED   Pseudomonas aeruginosa NOT DETECTED NOT DETECTED   Stenotrophomonas maltophilia NOT DETECTED NOT DETECTED   Candida albicans NOT DETECTED NOT DETECTED   Candida auris NOT DETECTED NOT DETECTED   Candida glabrata NOT DETECTED NOT DETECTED   Candida krusei NOT DETECTED NOT DETECTED   Candida parapsilosis NOT DETECTED NOT DETECTED   Candida tropicalis NOT DETECTED NOT DETECTED   Cryptococcus neoformans/gattii NOT DETECTED NOT DETECTED   CTX-M ESBL NOT DETECTED NOT DETECTED   Carbapenem resistance IMP NOT DETECTED NOT DETECTED   Carbapenem resistance KPC NOT DETECTED NOT DETECTED   Carbapenem resistance NDM NOT DETECTED NOT DETECTED   Carbapenem resist OXA 48 LIKE NOT DETECTED NOT DETECTED   Carbapenem resistance VIM NOT DETECTED NOT DETECTED    Akeela Busk O Tarynn Garling 11/21/2021  10:48 AM
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
## 2021-11-21 NOTE — Assessment & Plan Note (Addendum)
Most recent creatine of 3.07 from earlier this month. Creatinine of 6.76 on admission. Likely secondary to dehydration. Also complicated by acute illness and hypotension. Nephrology consulted on admission. Started on IV fluids and has now started on hemodialysis. Femoral dialysis catheter placed by vascular surgery on 12/28. CT abdomen/pelvis suggests hydronephrosis which may be contributing. Hemodialysis started 12/29; last session on 12/30. Femoral HD catheter removed on 12/30. AKI improving. -Nephrology recommendations: continue to evaluate for renal recovery -Watch weights and in/out
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
## 2021-11-21 NOTE — Progress Notes (Signed)
Patient temperature unresponsive to PO tylenol given at 0330. Temperature recheck of 101.4, MD made aware. Per MD, no treatment at this time unless patient becomes symptomatic.

## 2021-11-21 NOTE — Assessment & Plan Note (Addendum)
Patient is on amlodipine, hydralazine and losartan as an outpatient. Hypotensive on admission. Antihypertensives held. Blood pressure stable.

## 2021-11-21 NOTE — Progress Notes (Signed)
PROGRESS NOTE    JAMAS JAQUAY  QIW:[id number] DOB: 09-22-1961 DOA: 11/20/2021 PCP: Elizabeth Palau, MD (Inactive)   Brief Narrative: [name] is a 60 y.o. male with medical history significant for DM, HTN, CKD stage IV s/p left brachial AV fistula placement on 11/09/2021 in preparation for dialysis. Patient presented secondary to altered mental status and fever, meeting severe sepsis criteria with evidence of UTI and possible pneumonia. Empiric antibiotics initiated. Blood and urine cultures obtained.   Assessment & Plan:   * Severe sepsis ([place])- (present on admission) Secondary to likely UTI and possible pneumonia. Concern for bacteremia, likely gram negative. Started empirically on Ceftriaxone for UTI and Azithromycin for pneumonia. Zyvox started as well. Complicated by recently placed AV fistula. -Procalcitonin -Continue Zyvox pending blood culture data -Follow up blood and urine cultures (obtained on admission) -Antibiotics, see problems UTI (urinary tract infection) and Pneumonia  Pneumonia Chest x-ray with possible LL lung interstitial pneumonia. Started empirically on Azithromycin. On Ceftriaxone for UTI -Continue Ceftriaxone and Azithromycin  Acute kidney injury superimposed on CKD IV ([place]) Most recent creatine of 3.07 from earlier this month. Creatinine of 6.76 on admission. Likely secondary to dehydration. Also complicated by acute illness and hypotension. Nephrology consulted on admission. Started on IV fluids -Nephrology recommendations: pending -Watch weights and in/out  Acute metabolic encephalopathy Likely secondary to acute illness. CT head unremarkable for acute process.  UTI (urinary tract infection)- (present on admission) Urinalysis highly suggestive of infection. Started empirically on Ceftriaxone. Urine culture obtained and is pending. -Continue Ceftriaxone -Follow-up urine cultures -If no bacteremia and fevers persist, will obtain CT  abdomen/pelvis to rule out possible abscess  Primary hypertension- (present on admission) Patient is on amlodipine, hydralazine and losartan as an outpatient. Hypotensive on admission. Antihypertensives held.  Diabetes ([place]) Last hemoglobin A1C of 5.7% in 2011. Not on outpatient medication. -Check hemoglobin A1C    DVT prophylaxis: Heparin Code Status:   Code Status: Full Code Family Communication: None at bedside. Declined for me to contact family and stated he would call. Disposition Plan: Discharge back to facility likely in 2-5 days pending culture data, transition to outpatient antibiotics, nephrology recommendations for AKI/CKD   Consultants:  Nephrology  Procedures:  None  Antimicrobials: Ceftriaxone Azithromycin Linezolid    Subjective: No concerns this morning.  Objective: Vitals:   11/21/21 0600 11/21/21 0738 11/21/21 0800 11/21/21 0900  BP: (!) 152/95 (!) 105/59 114/63 120/74  Pulse: (!) 120 (!) 107 (!) 103 (!) 104  Resp: 17 15 15 15   Temp:  (!) 101.4 F (38.6 C)    TempSrc:  Oral    SpO2: 97% 95% 96% 97%  Weight:      Height:        Intake/Output Summary (Last 24 hours) at 11/21/2021 1027 Last data filed at 11/21/2021 0003 Gross per 24 hour  Intake 100 ml  Output --  Net 100 ml   Filed Weights   11/20/21 1225  Weight: 76 kg    Examination:  General exam: Appears calm and comfortable Respiratory system: Clear to auscultation. Respiratory effort normal. Cardiovascular system: S1 & S2 heard, RRR. No murmurs, rubs, gallops or clicks. Gastrointestinal system: Abdomen is nondistended, soft and nontender. No organomegaly or masses felt. Normal bowel sounds heard. Central nervous system: Alert and oriented to person only. Blind Musculoskeletal: 1+ BLE edema. No calf tenderness Skin: No cyanosis. No rashes Psychiatry: Judgement and insight appear normal. Mood & affect appropriate.     Data Reviewed: I have  personally reviewed following labs  and imaging studies  CBC Lab Results  Component Value Date   WBC 13.3 (H) 11/21/2021   RBC 3.54 (L) 11/21/2021   HGB 9.6 (L) 11/21/2021   HCT 29.1 (L) 11/21/2021   MCV 82.2 11/21/2021   MCH 27.1 11/21/2021   PLT 154 11/21/2021   MCHC 33.0 11/21/2021   RDW 14.7 11/21/2021   LYMPHSABS 1.1 11/20/2021   MONOABS 1.2 (H) 11/20/2021   EOSABS 0.0 11/20/2021   BASOSABS 0.0 34/74/2595     Last metabolic panel Lab Results  Component Value Date   NA 135 11/21/2021   K 5.7 (H) 11/21/2021   CL 106 11/21/2021   CO2 16 (L) 11/21/2021   BUN 105 (H) 11/21/2021   CREATININE 7.49 (H) 11/21/2021   GLUCOSE 125 (H) 11/21/2021   GFRNONAA 8 (L) 11/21/2021   GFRAA >60 07/12/2011   CALCIUM 8.6 (L) 11/21/2021   PHOS 2.1 (L) 07/08/2011   PROT 7.4 11/20/2021   ALBUMIN 3.3 (L) 11/20/2021   BILITOT 0.7 11/20/2021   ALKPHOS 51 11/20/2021   AST 60 (H) 11/20/2021   ALT 43 11/20/2021   ANIONGAP 13 11/21/2021    CBG (last 3)  No results for input(s): GLUCAP in the last 72 hours.   GFR: Estimated Creatinine Clearance: 11 mL/min (A) (by C-G formula based on SCr of 7.49 mg/dL (H)).  Coagulation Profile: Recent Labs  Lab 11/21/21 0745  INR 1.3*    Recent Results (from the past 240 hour(s))  Resp Panel by RT-PCR (Flu A&B, Covid) Nasopharyngeal Swab     Status: None   Collection Time: 11/20/21  8:48 PM   Specimen: Nasopharyngeal Swab; Nasopharyngeal(NP) swabs in vial transport medium  Result Value Ref Range Status   SARS Coronavirus 2 by RT PCR NEGATIVE NEGATIVE Final    Comment: (NOTE) SARS-CoV-2 target nucleic acids are NOT DETECTED.  The SARS-CoV-2 RNA is generally detectable in upper respiratory specimens during the acute phase of infection. The lowest concentration of SARS-CoV-2 viral copies this assay can detect is 138 copies/mL. A negative result does not preclude SARS-Cov-2 infection and should not be used as the sole basis for treatment or other patient management decisions. A  negative result may occur with  improper specimen collection/handling, submission of specimen other than nasopharyngeal swab, presence of viral mutation(s) within the areas targeted by this assay, and inadequate number of viral copies(<138 copies/mL). A negative result must be combined with clinical observations, patient history, and epidemiological information. The expected result is Negative.  Fact Sheet for Patients:  EntrepreneurPulse.com.au  Fact Sheet for Healthcare Providers:  IncredibleEmployment.be  This test is no t yet approved or cleared by the [place] FDA and  has been authorized for detection and/or diagnosis of SARS-CoV-2 by FDA under an Emergency Use Authorization (EUA). This EUA will remain  in effect (meaning this test can be used) for the duration of the COVID-19 declaration under Section 564(b)(1) of the Act, 21 U.S.C.section 360bbb-3(b)(1), unless the authorization is terminated  or revoked sooner.       Influenza A by PCR NEGATIVE NEGATIVE Final   Influenza B by PCR NEGATIVE NEGATIVE Final    Comment: (NOTE) The Xpert Xpress SARS-CoV-2/FLU/RSV plus assay is intended as an aid in the diagnosis of influenza from Nasopharyngeal swab specimens and should not be used as a sole basis for treatment. Nasal washings and aspirates are unacceptable for Xpert Xpress SARS-CoV-2/FLU/RSV testing.  Fact Sheet for Patients: EntrepreneurPulse.com.au  Fact Sheet for Healthcare Providers: IncredibleEmployment.be  This test is not yet approved or cleared by the [place] and has been authorized for detection and/or diagnosis of SARS-CoV-2 by FDA under an Emergency Use Authorization (EUA). This EUA will remain in effect (meaning this test can be used) for the duration of the COVID-19 declaration under Section 564(b)(1) of the Act, 21 U.S.C. section 360bbb-3(b)(1), unless the authorization  is terminated or revoked.  Performed at [facility], [address] [place], [place] [address]   Blood culture (routine x 2)     Status: None (Preliminary result)   Collection Time: 11/20/21 11:32 PM   Specimen: BLOOD  Result Value Ref Range Status   Specimen Description BLOOD RIGHT FOREARM  Final   Special Requests   Final    BOTTLES DRAWN AEROBIC AND ANAEROBIC Blood Culture results may not be optimal due to an excessive volume of blood received in culture bottles   Culture   Final    NO GROWTH < 12 HOURS Performed at [facility], [address] [place], [place] [address]    Report Status PENDING  Incomplete  Blood culture (routine x 2)     Status: None (Preliminary result)   Collection Time: 11/20/21 11:32 PM   Specimen: BLOOD  Result Value Ref Range Status   Specimen Description BLOOD RIGHT ASSIST CONTROL  Final   Special Requests   Final    BOTTLES DRAWN AEROBIC AND ANAEROBIC Blood Culture adequate volume   Culture  Setup Time   Final    GRAM NEGATIVE RODS ANAEROBIC BOTTLE ONLY Organism ID to follow Performed at [facility], [address] [place], [place] [address]    Culture GRAM NEGATIVE RODS  Final   Report Status PENDING  Incomplete        Radiology Studies: DG Chest 2 View  Result Date: 11/20/2021 CLINICAL DATA:  Fever, infection EXAM: CHEST - 2 VIEW COMPARISON:  Previous studies including the examination 07/06/2011 FINDINGS: Transverse diameter of heart is increased. There are no signs of alveolar pulmonary edema or focal pulmonary consolidation. Increased interstitial markings are seen in the left lower lung fields. There is no significant pleural effusion or pneumothorax. Old healed fractures are seen in multiple right ribs with no significant interval change. IMPRESSION: Cardiomegaly. There are no signs of alveolar pulmonary edema or focal pulmonary consolidation. Increased markings in the left lower lung fields may suggest  crowding of normal bronchovascular structures or subsegmental atelectasis or interstitial pneumonia. Electronically Signed   By: Elmer Picker M.D.   On: 11/20/2021 13:19   CT HEAD WO CONTRAST (5MM)  Result Date: 11/20/2021 CLINICAL DATA:  Acute neurological deficit, stroke suspected. Fever and altered mental status. EXAM: CT HEAD WITHOUT CONTRAST TECHNIQUE: Contiguous axial images were obtained from the base of the skull through the vertex without intravenous contrast. COMPARISON:  Maxillofacial CT 07/04/2011 FINDINGS: Brain: Mild diffuse cerebral atrophy. Mild ventricular dilatation likely due to central atrophy. Low-attenuation changes in the deep white matter consistent small vessel ischemia. Areas of encephalomalacia in the frontal lobes bilaterally. This is likely due to old infarct or injury and is unchanged since prior study. No mass-effect or midline shift. No abnormal extra-axial fluid collections. Gray-white matter junctions are distinct. Basal cisterns are not effaced. No acute intracranial hemorrhage. Vascular: Mild intracranial arterial vascular calcifications. Skull: Calvarium appears intact. Sinuses/Orbits: Mucosal thickening in the paranasal sinuses. Bilateral ocular prostheses. Other: Congenital nonunion of the posterior arch of C1. IMPRESSION: No acute intracranial abnormalities. Chronic atrophy and small vessel ischemic  changes. Old encephalomalacia in the frontal lobes. Electronically Signed   By: Lucienne Capers M.D.   On: 11/20/2021 17:23        Scheduled Meds:  aspirin EC  81 mg Oral Daily   heparin  5,000 Units Subcutaneous Q8H   Continuous Infusions:  sodium chloride 150 mL/hr at 11/21/21 0136   azithromycin     cefTRIAXone (ROCEPHIN)  IV     linezolid (ZYVOX) IV 600 mg (11/21/21 1002)     LOS: 0 days     Cordelia Poche, MD [facility] 11/21/2021, 10:27 AM  If 7PM-7AM, please contact night-coverage [email or web address]

## 2021-11-21 NOTE — Assessment & Plan Note (Addendum)
Urinalysis highly suggestive of infection. Started empirically on Ceftriaxone. Urine culture significant for Klebsiella. CT abdomen/pelvis (12/29) without evidence of abscess. pneumoniae, sensitive to cefazolin. Now afebrile. -Continue Cefazolin IV
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
## 2021-11-21 NOTE — Progress Notes (Signed)
Patient completes 2-hour treatment as a new start to Hemodialysis, has right femoral catheter. Patient with periods of confusion, disorientation, removing monitoring devices, making elicit remarks. Nevertheless, CVC functions well, maintains the prescribed BFR during treatment, no targeted UF for this initial treatment, Tylenol given for elevated temp, with decrease by end of session.  B/P low at end of treatment given additional NS. Patient scheduled second session on 11/22/21 for 2.5 hrs. Report given to primary nurse Minette Brine, patient returned to assigned room 227.

## 2021-11-21 NOTE — Assessment & Plan Note (Addendum)
Secondary to likely UTI and possible pneumonia. Initial concern for bacteremia, likely gram negative. Started empirically on Ceftriaxone for UTI and Azithromycin for pneumonia. Zyvox started as well. Complicated by recently placed AV fistula. Confirmed Klebsiella pneumoniae UTI and bacteremia. -Antibiotics, see problems UTI (urinary tract infection) and Pneumonia

## 2021-11-21 NOTE — ED Notes (Signed)
Admission MD at bedside.  

## 2021-11-21 NOTE — Assessment & Plan Note (Addendum)
Last hemoglobin A1C of 5.7% in 2011. Not on outpatient medication. Current hemoglobin A1C of 5.8%

## 2021-11-22 ENCOUNTER — Inpatient Hospital Stay: Payer: Medicaid Other

## 2021-11-22 DIAGNOSIS — B961 Klebsiella pneumoniae [K. pneumoniae] as the cause of diseases classified elsewhere: Secondary | ICD-10-CM

## 2021-11-22 DIAGNOSIS — Z8619 Personal history of other infectious and parasitic diseases: Secondary | ICD-10-CM

## 2021-11-22 LAB — CBC
HCT: 25.3 % — ABNORMAL LOW (ref 39.0–52.0)
Hemoglobin: 8.6 g/dL — ABNORMAL LOW (ref 13.0–17.0)
MCH: 27.3 pg (ref 26.0–34.0)
MCHC: 34 g/dL (ref 30.0–36.0)
MCV: 80.3 fL (ref 80.0–100.0)
Platelets: 133 10*3/uL — ABNORMAL LOW (ref 150–400)
RBC: 3.15 MIL/uL — ABNORMAL LOW (ref 4.22–5.81)
RDW: 14.4 % (ref 11.5–15.5)
WBC: 8.8 10*3/uL (ref 4.0–10.5)
nRBC: 0 % (ref 0.0–0.2)

## 2021-11-22 LAB — RENAL FUNCTION PANEL
Albumin: 2.4 g/dL — ABNORMAL LOW (ref 3.5–5.0)
Anion gap: 13 (ref 5–15)
BUN: 84 mg/dL — ABNORMAL HIGH (ref 8–23)
CO2: 21 mmol/L — ABNORMAL LOW (ref 22–32)
Calcium: 8.3 mg/dL — ABNORMAL LOW (ref 8.9–10.3)
Chloride: 99 mmol/L (ref 98–111)
Creatinine, Ser: 6.43 mg/dL — ABNORMAL HIGH (ref 0.61–1.24)
GFR, Estimated: 9 mL/min — ABNORMAL LOW (ref >60)
Glucose, Bld: 113 mg/dL — ABNORMAL HIGH (ref 70–99)
Phosphorus: 4.4 mg/dL (ref 2.5–4.6)
Potassium: 4.6 mmol/L (ref 3.5–5.1)
Sodium: 133 mmol/L — ABNORMAL LOW (ref 135–145)

## 2021-11-22 LAB — HEMOGLOBIN A1C
Hgb A1c MFr Bld: 5.8 % — ABNORMAL HIGH (ref 4.8–5.6)
Mean Plasma Glucose: 119.76 mg/dL

## 2021-11-22 LAB — PARATHYROID HORMONE, INTACT (NO CA): PTH: 146 pg/mL — ABNORMAL HIGH (ref 15–65)

## 2021-11-22 MED ORDER — IOHEXOL 9 MG/ML PO SOLN
500.0000 mL | ORAL | Status: AC
  Administered 2021-11-22 (×2): 500 mL via ORAL

## 2021-11-22 MED ORDER — PREGABALIN 25 MG PO CAPS
25.0000 mg | ORAL_CAPSULE | Freq: Every day | ORAL | Status: DC
  Administered 2021-11-22 – 2021-11-26 (×5): 25 mg via ORAL
  Filled 2021-11-22 (×5): qty 1

## 2021-11-22 MED ORDER — OXYCODONE HCL 5 MG PO TABS
5.0000 mg | ORAL_TABLET | Freq: Four times a day (QID) | ORAL | Status: DC | PRN
Start: 2021-11-22 — End: 2021-11-26

## 2021-11-22 NOTE — Consult Note (Addendum)
WOC Nurse Consult Note: Reason for Consult: Consult requested for buttocks Wound type: Right buttock with patchy areas of chronic full thickness skin loss; affected area is approx 4X4X.2cm, red and moist, shaggy irregular wound edges.  Appearance is consistent with chronic shear/friction injuries; not a pressure injury, usually related to getting in and out of bed/chair/commode.  Pt states "it has been there about 5 years and never gets better." Dressing procedure/placement/frequency: Topical treatment orders provided for bedside nurses to perform as follows to protect from further injury; Foam dressing to buttocks, change Q 3 days or PRN soiling. Please re-consult if further assistance is needed.  Thank-you,  Julien Girt MSN, [place], [place], [place], [place]

## 2021-11-22 NOTE — Progress Notes (Signed)
PROGRESS NOTE    [name]  SNK:[id number] DOB: 1961/04/11 DOA: 11/20/2021 PCP: Elizabeth Palau, MD (Inactive)   Brief Narrative: [name] is a 60 y.o. male with medical history significant for DM, HTN, CKD stage IV s/p left brachial AV fistula placement on 11/09/2021 in preparation for dialysis. Patient presented secondary to altered mental status and fever, meeting severe sepsis criteria with evidence of UTI and possible pneumonia. Empiric antibiotics initiated. Blood and urine cultures obtained and are significant for Klebsiella infection.   Assessment & Plan:   * Severe sepsis ([place])- (present on admission) Secondary to likely UTI and possible pneumonia. Concern for bacteremia, likely gram negative. Started empirically on Ceftriaxone for UTI and Azithromycin for pneumonia. Zyvox started as well. Complicated by recently placed AV fistula. -Procalcitonin -Continue Zyvox pending blood culture data -Follow up blood and urine cultures (obtained on admission) -Antibiotics, see problems UTI (urinary tract infection) and Pneumonia  Acute kidney injury superimposed on CKD IV ([place]) Most recent creatine of 3.07 from earlier this month. Creatinine of 6.76 on admission. Likely secondary to dehydration. Also complicated by acute illness and hypotension. Nephrology consulted on admission. Started on IV fluids and has now started on hemodialysis. Femoral dialysis catheter placed by vascular surgery on 12/28. Hemodialysis started 12/29 -Nephrology recommendations: HD -Watch weights and in/out  Acute metabolic encephalopathy Likely secondary to acute illness. CT head unremarkable for acute process.  UTI (urinary tract infection)- (present on admission) Urinalysis highly suggestive of infection. Started empirically on Ceftriaxone. Urine culture significant for GNR. -Continue Ceftriaxone IV -CT abdomen/pelvis secondary to persistent high fevers  Hepatitis C antibody test  positive -Obtain HCV quant  Hyperkalemia Secondary to kidney function. Given Lokelma 10 g x1. Management per HD.  Pneumonia Chest x-ray with possible LL lung interstitial pneumonia. Started empirically on Azithromycin. On Ceftriaxone for UTI -Continue Ceftriaxone and Azithromycin  Diabetes ([place]) Last hemoglobin A1C of 5.7% in 2011. Not on outpatient medication. -Check hemoglobin A1C   Bacteremia due to Klebsiella pneumoniae See problem, UTI (urinary tract infection)  Primary hypertension- (present on admission) Patient is on amlodipine, hydralazine and losartan as an outpatient. Hypotensive on admission. Antihypertensives held.   DVT prophylaxis: Heparin Code Status:   Code Status: Full Code Family Communication: None at bedside. Declined for me to contact family and stated he would call. Disposition Plan: Discharge back to facility likely in 2-5 days pending culture data, transition to outpatient antibiotics, nephrology recommendations for AKI/CKD   Consultants:  Nephrology  Procedures:  Hemodialysis  Antimicrobials: Ceftriaxone Azithromycin Linezolid    Subjective: Cold. No other concerns  Objective: Vitals:   11/22/21 0815 11/22/21 0835 11/22/21 1127 11/22/21 1142  BP: (!) 125/96   130/70  Pulse: (!) 106 (!) 103  (!) 105  Resp: 18   18  Temp: (!) 101.2 F (38.4 C) 100.2 F (37.9 C) (!) 101.7 F (38.7 C) (!) 102.7 F (39.3 C)  TempSrc: Tympanic Oral Axillary Oral  SpO2: (!) 72% 96%  93%  Weight:      Height:        Intake/Output Summary (Last 24 hours) at 11/22/2021 1223 Last data filed at 11/22/2021 1042 Gross per 24 hour  Intake 120 ml  Output -211 ml  Net 331 ml    Filed Weights   11/21/21 1651 11/21/21 1900 11/22/21 0500  Weight: 83.4 kg 83.8 kg 81.4 kg    Examination:  General exam: Appears calm and comfortable Respiratory system: Clear to auscultation. Respiratory effort normal. Cardiovascular system:  S1 & S2 heard, RRR. No murmurs,  rubs, gallops or clicks. Gastrointestinal system: Abdomen is nondistended, soft and nontender. No organomegaly or masses felt. Normal bowel sounds heard. Central nervous system: Alert and oriented. No focal neurological deficits. Musculoskeletal: No edema. No calf tenderness Skin: No cyanosis. No rashes Psychiatry: Judgement and insight appear normal. Mood & affect appropriate.     Data Reviewed: I have personally reviewed following labs and imaging studies  CBC Lab Results  Component Value Date   WBC 8.8 11/22/2021   RBC 3.15 (L) 11/22/2021   HGB 8.6 (L) 11/22/2021   HCT 25.3 (L) 11/22/2021   MCV 80.3 11/22/2021   MCH 27.3 11/22/2021   PLT 133 (L) 11/22/2021   MCHC 34.0 11/22/2021   RDW 14.4 11/22/2021   LYMPHSABS 1.1 11/20/2021   MONOABS 1.2 (H) 11/20/2021   EOSABS 0.0 11/20/2021   BASOSABS 0.0 41/32/4401     Last metabolic panel Lab Results  Component Value Date   NA 133 (L) 11/22/2021   K 4.6 11/22/2021   CL 99 11/22/2021   CO2 21 (L) 11/22/2021   BUN 84 (H) 11/22/2021   CREATININE 6.43 (H) 11/22/2021   GLUCOSE 113 (H) 11/22/2021   GFRNONAA 9 (L) 11/22/2021   GFRAA >60 07/12/2011   CALCIUM 8.3 (L) 11/22/2021   PHOS 4.4 11/22/2021   PROT 7.4 11/20/2021   ALBUMIN 2.4 (L) 11/22/2021   BILITOT 0.7 11/20/2021   ALKPHOS 51 11/20/2021   AST 60 (H) 11/20/2021   ALT 43 11/20/2021   ANIONGAP 13 11/22/2021    CBG (last 3)  No results for input(s): GLUCAP in the last 72 hours.   GFR: Estimated Creatinine Clearance: 13.5 mL/min (A) (by C-G formula based on SCr of 6.43 mg/dL (H)).  Coagulation Profile: Recent Labs  Lab 11/21/21 0745  INR 1.3*     Recent Results (from the past 240 hour(s))  Resp Panel by RT-PCR (Flu A&B, Covid) Nasopharyngeal Swab     Status: None   Collection Time: 11/20/21  8:48 PM   Specimen: Nasopharyngeal Swab; Nasopharyngeal(NP) swabs in vial transport medium  Result Value Ref Range Status   SARS Coronavirus 2 by RT PCR NEGATIVE  NEGATIVE Final    Comment: (NOTE) SARS-CoV-2 target nucleic acids are NOT DETECTED.  The SARS-CoV-2 RNA is generally detectable in upper respiratory specimens during the acute phase of infection. The lowest concentration of SARS-CoV-2 viral copies this assay can detect is 138 copies/mL. A negative result does not preclude SARS-Cov-2 infection and should not be used as the sole basis for treatment or other patient management decisions. A negative result may occur with  improper specimen collection/handling, submission of specimen other than nasopharyngeal swab, presence of viral mutation(s) within the areas targeted by this assay, and inadequate number of viral copies(<138 copies/mL). A negative result must be combined with clinical observations, patient history, and epidemiological information. The expected result is Negative.  Fact Sheet for Patients:  EntrepreneurPulse.com.au  Fact Sheet for Healthcare Providers:  IncredibleEmployment.be  This test is no t yet approved or cleared by the [place] FDA and  has been authorized for detection and/or diagnosis of SARS-CoV-2 by FDA under an Emergency Use Authorization (EUA). This EUA will remain  in effect (meaning this test can be used) for the duration of the COVID-19 declaration under Section 564(b)(1) of the Act, 21 U.S.C.section 360bbb-3(b)(1), unless the authorization is terminated  or revoked sooner.       Influenza A by PCR NEGATIVE NEGATIVE Final  Influenza B by PCR NEGATIVE NEGATIVE Final    Comment: (NOTE) The Xpert Xpress SARS-CoV-2/FLU/RSV plus assay is intended as an aid in the diagnosis of influenza from Nasopharyngeal swab specimens and should not be used as a sole basis for treatment. Nasal washings and aspirates are unacceptable for Xpert Xpress SARS-CoV-2/FLU/RSV testing.  Fact Sheet for Patients: EntrepreneurPulse.com.au  Fact Sheet for Healthcare  Providers: IncredibleEmployment.be  This test is not yet approved or cleared by the [place] FDA and has been authorized for detection and/or diagnosis of SARS-CoV-2 by FDA under an Emergency Use Authorization (EUA). This EUA will remain in effect (meaning this test can be used) for the duration of the COVID-19 declaration under Section 564(b)(1) of the Act, 21 U.S.C. section 360bbb-3(b)(1), unless the authorization is terminated or revoked.  Performed at [facility], [address] [place], [place] [address]   Blood culture (routine x 2)     Status: Abnormal (Preliminary result)   Collection Time: 11/20/21 11:32 PM   Specimen: BLOOD  Result Value Ref Range Status   Specimen Description   Final    BLOOD RIGHT FOREARM Performed at [facility], [address] [place], [place] [address]    Special Requests   Final    BOTTLES DRAWN AEROBIC AND ANAEROBIC Blood Culture results may not be optimal due to an excessive volume of blood received in culture bottles Performed at [facility], [address] [place], [place] [address]    Culture  Setup Time   Final    GRAM NEGATIVE RODS IN BOTH AEROBIC AND ANAEROBIC BOTTLES CRITICAL VALUE NOTED.  VALUE IS CONSISTENT WITH PREVIOUSLY REPORTED AND CALLED VALUE. Performed at [facility], [address], [place], [place] [address]    Culture KLEBSIELLA PNEUMONIAE (A)  Final   Report Status PENDING  Incomplete  Blood culture (routine x 2)     Status: Abnormal (Preliminary result)   Collection Time: 11/20/21 11:32 PM   Specimen: BLOOD  Result Value Ref Range Status   Specimen Description   Final    BLOOD RIGHT ASSIST CONTROL Performed at [facility], [address] [place], [place] [address]    Special Requests   Final    BOTTLES DRAWN AEROBIC AND ANAEROBIC Blood Culture adequate volume Performed at [facility], [address] [place], [place]  [address]    Culture  Setup Time   Final    GRAM NEGATIVE RODS IN BOTH AEROBIC AND ANAEROBIC BOTTLES Organism ID to follow CRITICAL RESULT CALLED TO, READ BACK BY AND VERIFIED WITHArdeen Garland [facility] 1039 11/21/21 HNM Performed at [facility], [address] [place], [place] [address]    Culture (A)  Final    KLEBSIELLA PNEUMONIAE SUSCEPTIBILITIES TO FOLLOW Performed at [facility], [place] [address], [place],  [address]    Report Status PENDING  Incomplete  Blood Culture ID Panel (Reflexed)     Status: Abnormal   Collection Time: 11/20/21 11:32 PM  Result Value Ref Range Status   Enterococcus faecalis NOT DETECTED NOT DETECTED Final   Enterococcus Faecium NOT DETECTED NOT DETECTED Final   Listeria monocytogenes NOT DETECTED NOT DETECTED Final   Staphylococcus species NOT DETECTED NOT DETECTED Final   Staphylococcus aureus (BCID) NOT DETECTED NOT DETECTED Final   Staphylococcus epidermidis NOT DETECTED NOT DETECTED Final   Staphylococcus lugdunensis NOT DETECTED NOT DETECTED Final   Streptococcus species NOT DETECTED NOT DETECTED Final   Streptococcus agalactiae NOT DETECTED NOT  DETECTED Final   Streptococcus pneumoniae NOT DETECTED NOT DETECTED Final   Streptococcus pyogenes NOT DETECTED NOT DETECTED Final   A.calcoaceticus-baumannii NOT DETECTED NOT DETECTED Final   Bacteroides fragilis NOT DETECTED NOT DETECTED Final   Enterobacterales DETECTED (A) NOT DETECTED Final    Comment: Enterobacterales represent a large order of gram negative bacteria, not a single organism. CRITICAL RESULT CALLED TO, READ BACK BY AND VERIFIED WITH: MORGAN HICKS PHARMD [id number] 11/21/21 HNM    Enterobacter cloacae complex NOT DETECTED NOT DETECTED Final   Escherichia coli NOT DETECTED NOT DETECTED Final   Klebsiella aerogenes NOT DETECTED NOT DETECTED Final   Klebsiella oxytoca NOT DETECTED NOT DETECTED Final   Klebsiella pneumoniae DETECTED (A) NOT DETECTED Final    Comment:  CRITICAL RESULT CALLED TO, READ BACK BY AND VERIFIED WITH: MORGAN HICKS PHARMD 1039 11/21/21 HNM    Proteus species NOT DETECTED NOT DETECTED Final   Salmonella species NOT DETECTED NOT DETECTED Final   Serratia marcescens NOT DETECTED NOT DETECTED Final   Haemophilus influenzae NOT DETECTED NOT DETECTED Final   Neisseria meningitidis NOT DETECTED NOT DETECTED Final   Pseudomonas aeruginosa NOT DETECTED NOT DETECTED Final   Stenotrophomonas maltophilia NOT DETECTED NOT DETECTED Final   Candida albicans NOT DETECTED NOT DETECTED Final   Candida auris NOT DETECTED NOT DETECTED Final   Candida glabrata NOT DETECTED NOT DETECTED Final   Candida krusei NOT DETECTED NOT DETECTED Final   Candida parapsilosis NOT DETECTED NOT DETECTED Final   Candida tropicalis NOT DETECTED NOT DETECTED Final   Cryptococcus neoformans/gattii NOT DETECTED NOT DETECTED Final   CTX-M ESBL NOT DETECTED NOT DETECTED Final   Carbapenem resistance IMP NOT DETECTED NOT DETECTED Final   Carbapenem resistance KPC NOT DETECTED NOT DETECTED Final   Carbapenem resistance NDM NOT DETECTED NOT DETECTED Final   Carbapenem resist OXA 48 LIKE NOT DETECTED NOT DETECTED Final   Carbapenem resistance VIM NOT DETECTED NOT DETECTED Final    Comment: Performed at [facility], [address], [place], [place] [address]  Urine Culture     Status: Abnormal (Preliminary result)   Collection Time: 11/20/21 11:33 PM   Specimen: Urine, Random  Result Value Ref Range Status   Specimen Description   Final    URINE, RANDOM Performed at [facility], [address] [place], [place] [address]    Special Requests   Final    NONE Performed at [facility], [address] [place], [place] [address]    Culture (A)  Final    >=100,000 COLONIES/mL KLEBSIELLA PNEUMONIAE SUSCEPTIBILITIES TO FOLLOW Performed at [facility], [address] [place], [place] [address]    Report Status PENDING  Incomplete          Radiology Studies: DG Chest 2 View  Result Date: 11/20/2021 CLINICAL DATA:  Fever, infection EXAM: CHEST - 2 VIEW COMPARISON:  Previous studies including the examination 07/06/2011 FINDINGS: Transverse diameter of heart is increased. There are no signs of alveolar pulmonary edema or focal pulmonary consolidation. Increased interstitial markings are seen in the left lower lung fields. There is no significant pleural effusion or pneumothorax. Old healed fractures are seen in multiple right ribs with no significant interval change. IMPRESSION: Cardiomegaly. There are no signs of alveolar pulmonary edema or focal pulmonary consolidation. Increased markings in the left lower lung fields may suggest crowding of normal bronchovascular structures or subsegmental atelectasis or interstitial pneumonia. Electronically Signed   By: Prudy Feeler.D.  On: 11/20/2021 13:19   CT HEAD WO CONTRAST (5MM)  Result Date: 11/20/2021 CLINICAL DATA:  Acute neurological deficit, stroke suspected. Fever and altered mental status. EXAM: CT HEAD WITHOUT CONTRAST TECHNIQUE: Contiguous axial images were obtained from the base of the skull through the vertex without intravenous contrast. COMPARISON:  Maxillofacial CT 07/04/2011 FINDINGS: Brain: Mild diffuse cerebral atrophy. Mild ventricular dilatation likely due to central atrophy. Low-attenuation changes in the deep white matter consistent small vessel ischemia. Areas of encephalomalacia in the frontal lobes bilaterally. This is likely due to old infarct or injury and is unchanged since prior study. No mass-effect or midline shift. No abnormal extra-axial fluid collections. Gray-white matter junctions are distinct. Basal cisterns are not effaced. No acute intracranial hemorrhage. Vascular: Mild intracranial arterial vascular calcifications. Skull: Calvarium appears intact. Sinuses/Orbits: Mucosal thickening in the paranasal sinuses. Bilateral ocular prostheses.  Other: Congenital nonunion of the posterior arch of C1. IMPRESSION: No acute intracranial abnormalities. Chronic atrophy and small vessel ischemic changes. Old encephalomalacia in the frontal lobes. Electronically Signed   By: Lucienne Capers M.D.   On: 11/20/2021 17:23   PERIPHERAL VASCULAR CATHETERIZATION  Result Date: 11/21/2021 See surgical note for result.       Scheduled Meds:  aspirin EC  81 mg Oral Daily   Chlorhexidine Gluconate Cloth  6 each Topical Q0600   heparin  5,000 Units Subcutaneous Q8H   Continuous Infusions:  azithromycin 500 mg (11/21/21 2236)   cefTRIAXone (ROCEPHIN)  IV 2 g (11/22/21 1158)     LOS: 1 day     Cordelia Poche, MD [facility] 11/22/2021, 12:23 PM  If 7PM-7AM, please contact night-coverage [email or web address]

## 2021-11-22 NOTE — Progress Notes (Signed)
[facility]  Daily Progress Note   Subjective  -   No events overnight. Got HD yesterday and doing it again today  Objective Vitals:   11/22/21 0815 11/22/21 0835 11/22/21 1127 11/22/21 1142  BP: (!) 125/96   130/70  Pulse: (!) 106 (!) 103  (!) 105  Resp: 18   18  Temp: (!) 101.2 F (38.4 C) 100.2 F (37.9 C) (!) 101.7 F (38.7 C) (!) 102.7 F (39.3 C)  TempSrc: Tympanic Oral Axillary Oral  SpO2: (!) 72% 96%  93%  Weight:      Height:        Intake/Output Summary (Last 24 hours) at 11/22/2021 1235 Last data filed at 11/22/2021 1042 Gross per 24 hour  Intake 120 ml  Output -211 ml  Net 331 ml    PULM  CTAB CV  tachkycardic VASC  Right groin temp cath in place  Laboratory CBC    Component Value Date/Time   WBC 8.8 11/22/2021 0517   HGB 8.6 (L) 11/22/2021 0517   HCT 25.3 (L) 11/22/2021 0517   PLT 133 (L) 11/22/2021 0517    BMET    Component Value Date/Time   NA 133 (L) 11/22/2021 0517   K 4.6 11/22/2021 0517   CL 99 11/22/2021 0517   CO2 21 (L) 11/22/2021 0517   GLUCOSE 113 (H) 11/22/2021 0517   BUN 84 (H) 11/22/2021 0517   CREATININE 6.43 (H) 11/22/2021 0517   CALCIUM 8.3 (L) 11/22/2021 0517   GFRNONAA 9 (L) 11/22/2021 0517   GFRAA >60 07/12/2011 0610    Assessment/Planning: POD #1 s/p temp cath placement  Temp cath worked well for dialysis Will not place Permcath until afebrile and infection free for at least 24-48 hours AVF will not be useable for another month or so   Leotis Pain  11/22/2021, 12:35 PM

## 2021-11-22 NOTE — Assessment & Plan Note (Addendum)
Negative HCV quant.

## 2021-11-22 NOTE — Progress Notes (Signed)
Patient completes second day of new start treatment. Femoral CVC functions well, dressing care performed. Targeted UF met. Patient febrile at end at session, had received Tylenol prior to coming to treatment. Patient anxious about the time on dialysis machine, again removing monitoring devices when he assumed treatment should end. Report given to primary nurse Maudie Mercury.

## 2021-11-22 NOTE — Assessment & Plan Note (Signed)
Likely secondary to acute illness. CT head unremarkable for acute process.

## 2021-11-22 NOTE — Progress Notes (Signed)
[facility]  ROUNDING NOTE   Subjective:   Patient seen laying in bed, covers pulled over head Does not remember having dialysis yesterday Reports a list of complaints not limited to not having on clothes, too cold in room, quality of food, etc Denies shortness of breath  Dialysis later today  Objective:  Vital signs in last 24 hours:  Temp:  [99.1 F (37.3 C)-102.7 F (39.3 C)] 102.7 F (39.3 C) (12/29 1142) Pulse Rate:  [32-120] 105 (12/29 1142) Resp:  [12-26] 18 (12/29 1142) BP: (49-214)/(10-194) 130/70 (12/29 1142) SpO2:  [72 %-100 %] 93 % (12/29 1142) Weight:  [81.4 kg-83.8 kg] 81.4 kg (12/29 0500)  Weight change: 7.4 kg Filed Weights   11/21/21 1651 11/21/21 1900 11/22/21 0500  Weight: 83.4 kg 83.8 kg 81.4 kg    Intake/Output: I/O last 3 completed shifts: In: 100 [IV Piggyback:100] Out: -211    Intake/Output this shift:  Total I/O In: 120 [P.O.:120] Out: -   Physical Exam: General: NAD, resting in bed  Head: Normocephalic, atraumatic. Moist oral mucosal membranes  Eyes: Anicteric  Neck: Supple, trachea midline  Lungs:  Clear to auscultation  Heart: Regular rate and rhythm  Abdomen:  Soft, nontender,   Extremities:  1+ peripheral edema.  Neurologic: Nonfocal, moving all four extremities  Skin: No lesions  Access: Rt femoral temp cath placed on 27/51/70    Basic Metabolic Panel: Recent Labs  Lab 11/20/21 1224 11/21/21 0745 11/21/21 1610 11/22/21 0517  NA 131* 135  --  133*  K 5.0 5.7*  --  4.6  CL 100 106  --  99  CO2 20* 16*  --  21*  GLUCOSE 161* 125*  --  113*  BUN 94* 105*  --  84*  CREATININE 6.76* 7.49*  --  6.43*  CALCIUM 9.1 8.6*  --  8.3*  PHOS  --   --  4.3 4.4    Liver Function Tests: Recent Labs  Lab 11/20/21 1224 11/22/21 0517  AST 60*  --   ALT 43  --   ALKPHOS 51  --   BILITOT 0.7  --   PROT 7.4  --   ALBUMIN 3.3* 2.4*   No results for input(s): LIPASE, AMYLASE in the last 168 hours. No results for  input(s): AMMONIA in the last 168 hours.  CBC: Recent Labs  Lab 11/20/21 1224 11/21/21 0745 11/22/21 0517  WBC 10.3 13.3* 8.8  NEUTROABS 8.0*  --   --   HGB 11.0* 9.6* 8.6*  HCT 34.0* 29.1* 25.3*  MCV 84.2 82.2 80.3  PLT 175 154 133*    Cardiac Enzymes: No results for input(s): CKTOTAL, CKMB, CKMBINDEX, TROPONINI in the last 168 hours.  BNP: Invalid input(s): POCBNP  CBG: No results for input(s): GLUCAP in the last 168 hours.  Microbiology: Results for orders placed or performed during the hospital encounter of 11/20/21  Resp Panel by RT-PCR (Flu A&B, Covid) Nasopharyngeal Swab     Status: None   Collection Time: 11/20/21  8:48 PM   Specimen: Nasopharyngeal Swab; Nasopharyngeal(NP) swabs in vial transport medium  Result Value Ref Range Status   SARS Coronavirus 2 by RT PCR NEGATIVE NEGATIVE Final    Comment: (NOTE) SARS-CoV-2 target nucleic acids are NOT DETECTED.  The SARS-CoV-2 RNA is generally detectable in upper respiratory specimens during the acute phase of infection. The lowest concentration of SARS-CoV-2 viral copies this assay can detect is 138 copies/mL. A negative result does not preclude SARS-Cov-2 infection and should  not be used as the sole basis for treatment or other patient management decisions. A negative result may occur with  improper specimen collection/handling, submission of specimen other than nasopharyngeal swab, presence of viral mutation(s) within the areas targeted by this assay, and inadequate number of viral copies(<138 copies/mL). A negative result must be combined with clinical observations, patient history, and epidemiological information. The expected result is Negative.  Fact Sheet for Patients:  EntrepreneurPulse.com.au  Fact Sheet for Healthcare Providers:  IncredibleEmployment.be  This test is no t yet approved or cleared by the [place] FDA and  has been authorized for detection  and/or diagnosis of SARS-CoV-2 by FDA under an Emergency Use Authorization (EUA). This EUA will remain  in effect (meaning this test can be used) for the duration of the COVID-19 declaration under Section 564(b)(1) of the Act, 21 U.S.C.section 360bbb-3(b)(1), unless the authorization is terminated  or revoked sooner.       Influenza A by PCR NEGATIVE NEGATIVE Final   Influenza B by PCR NEGATIVE NEGATIVE Final    Comment: (NOTE) The Xpert Xpress SARS-CoV-2/FLU/RSV plus assay is intended as an aid in the diagnosis of influenza from Nasopharyngeal swab specimens and should not be used as a sole basis for treatment. Nasal washings and aspirates are unacceptable for Xpert Xpress SARS-CoV-2/FLU/RSV testing.  Fact Sheet for Patients: EntrepreneurPulse.com.au  Fact Sheet for Healthcare Providers: IncredibleEmployment.be  This test is not yet approved or cleared by the [place] FDA and has been authorized for detection and/or diagnosis of SARS-CoV-2 by FDA under an Emergency Use Authorization (EUA). This EUA will remain in effect (meaning this test can be used) for the duration of the COVID-19 declaration under Section 564(b)(1) of the Act, 21 U.S.C. section 360bbb-3(b)(1), unless the authorization is terminated or revoked.  Performed at [facility], [address] [place], [place] [address]   Blood culture (routine x 2)     Status: Abnormal (Preliminary result)   Collection Time: 11/20/21 11:32 PM   Specimen: BLOOD  Result Value Ref Range Status   Specimen Description   Final    BLOOD RIGHT FOREARM Performed at [facility], [address], [place], [place] [address]    Special Requests   Final    BOTTLES DRAWN AEROBIC AND ANAEROBIC Blood Culture results may not be optimal due to an excessive volume of blood received in culture bottles Performed at [facility], [address], [place], [place] [address]     Culture  Setup Time   Final    GRAM NEGATIVE RODS IN BOTH AEROBIC AND ANAEROBIC BOTTLES CRITICAL VALUE NOTED.  VALUE IS CONSISTENT WITH PREVIOUSLY REPORTED AND CALLED VALUE. Performed at [facility], [address], [place], [place] [address]    Culture KLEBSIELLA PNEUMONIAE (A)  Final   Report Status PENDING  Incomplete  Blood culture (routine x 2)     Status: Abnormal (Preliminary result)   Collection Time: 11/20/21 11:32 PM   Specimen: BLOOD  Result Value Ref Range Status   Specimen Description   Final    BLOOD RIGHT ASSIST CONTROL Performed at [facility], [address], [place], [place] [address]    Special Requests   Final    BOTTLES DRAWN AEROBIC AND ANAEROBIC Blood Culture adequate volume Performed at [facility], [address] [place], [place] [address]    Culture  Setup Time   Final    GRAM NEGATIVE RODS IN BOTH AEROBIC AND ANAEROBIC BOTTLES Organism ID to  follow CRITICAL RESULT CALLED TO, READ BACK BY AND VERIFIED WITHArdeen Garland [facility] 3646 11/21/21 HNM Performed at [facility], [address], [place], [place] [address]    Culture (A)  Final    KLEBSIELLA PNEUMONIAE SUSCEPTIBILITIES TO FOLLOW Performed at [facility], [place] [address], [place], [place] [address]    Report Status PENDING  Incomplete  Blood Culture ID Panel (Reflexed)     Status: Abnormal   Collection Time: 11/20/21 11:32 PM  Result Value Ref Range Status   Enterococcus faecalis NOT DETECTED NOT DETECTED Final   Enterococcus Faecium NOT DETECTED NOT DETECTED Final   Listeria monocytogenes NOT DETECTED NOT DETECTED Final   Staphylococcus species NOT DETECTED NOT DETECTED Final   Staphylococcus aureus (BCID) NOT DETECTED NOT DETECTED Final   Staphylococcus epidermidis NOT DETECTED NOT DETECTED Final   Staphylococcus lugdunensis NOT DETECTED NOT DETECTED Final   Streptococcus species NOT DETECTED NOT DETECTED Final   Streptococcus agalactiae  NOT DETECTED NOT DETECTED Final   Streptococcus pneumoniae NOT DETECTED NOT DETECTED Final   Streptococcus pyogenes NOT DETECTED NOT DETECTED Final   A.calcoaceticus-baumannii NOT DETECTED NOT DETECTED Final   Bacteroides fragilis NOT DETECTED NOT DETECTED Final   Enterobacterales DETECTED (A) NOT DETECTED Final    Comment: Enterobacterales represent a large order of gram negative bacteria, not a single organism. CRITICAL RESULT CALLED TO, READ BACK BY AND VERIFIED WITH: MORGAN HICKS PHARMD [id number] 11/21/21 HNM    Enterobacter cloacae complex NOT DETECTED NOT DETECTED Final   Escherichia coli NOT DETECTED NOT DETECTED Final   Klebsiella aerogenes NOT DETECTED NOT DETECTED Final   Klebsiella oxytoca NOT DETECTED NOT DETECTED Final   Klebsiella pneumoniae DETECTED (A) NOT DETECTED Final    Comment: CRITICAL RESULT CALLED TO, READ BACK BY AND VERIFIED WITH: MORGAN HICKS PHARMD 1039 11/21/21 HNM    Proteus species NOT DETECTED NOT DETECTED Final   Salmonella species NOT DETECTED NOT DETECTED Final   Serratia marcescens NOT DETECTED NOT DETECTED Final   Haemophilus influenzae NOT DETECTED NOT DETECTED Final   Neisseria meningitidis NOT DETECTED NOT DETECTED Final   Pseudomonas aeruginosa NOT DETECTED NOT DETECTED Final   Stenotrophomonas maltophilia NOT DETECTED NOT DETECTED Final   Candida albicans NOT DETECTED NOT DETECTED Final   Candida auris NOT DETECTED NOT DETECTED Final   Candida glabrata NOT DETECTED NOT DETECTED Final   Candida krusei NOT DETECTED NOT DETECTED Final   Candida parapsilosis NOT DETECTED NOT DETECTED Final   Candida tropicalis NOT DETECTED NOT DETECTED Final   Cryptococcus neoformans/gattii NOT DETECTED NOT DETECTED Final   CTX-M ESBL NOT DETECTED NOT DETECTED Final   Carbapenem resistance IMP NOT DETECTED NOT DETECTED Final   Carbapenem resistance KPC NOT DETECTED NOT DETECTED Final   Carbapenem resistance NDM NOT DETECTED NOT DETECTED Final   Carbapenem resist  OXA 48 LIKE NOT DETECTED NOT DETECTED Final   Carbapenem resistance VIM NOT DETECTED NOT DETECTED Final    Comment: Performed at [facility], [address], [place], [place] [address]  Urine Culture     Status: Abnormal (Preliminary result)   Collection Time: 11/20/21 11:33 PM   Specimen: Urine, Random  Result Value Ref Range Status   Specimen Description   Final    URINE, RANDOM Performed at [facility], [address], [place], [place] [address]    Special Requests   Final    NONE Performed at [facility], [address], [place], [place] [address]  Culture (A)  Final    >=100,000 COLONIES/mL KLEBSIELLA PNEUMONIAE SUSCEPTIBILITIES TO FOLLOW Performed at [facility], [place] [address], [place], [place] [address]    Report Status PENDING  Incomplete    Coagulation Studies: Recent Labs    11/21/21 0745  LABPROT 16.6*  INR 1.3*    Urinalysis: Recent Labs    11/20/21 2333  COLORURINE YELLOW*  LABSPEC 1.010  PHURINE 6.0  GLUCOSEU NEGATIVE  HGBUR MODERATE*  BILIRUBINUR NEGATIVE  KETONESUR NEGATIVE  PROTEINUR 100*  NITRITE POSITIVE*  LEUKOCYTESUR LARGE*      Imaging: CT HEAD WO CONTRAST (5MM)  Result Date: 11/20/2021 CLINICAL DATA:  Acute neurological deficit, stroke suspected. Fever and altered mental status. EXAM: CT HEAD WITHOUT CONTRAST TECHNIQUE: Contiguous axial images were obtained from the base of the skull through the vertex without intravenous contrast. COMPARISON:  Maxillofacial CT 07/04/2011 FINDINGS: Brain: Mild diffuse cerebral atrophy. Mild ventricular dilatation likely due to central atrophy. Low-attenuation changes in the deep white matter consistent small vessel ischemia. Areas of encephalomalacia in the frontal lobes bilaterally. This is likely due to old infarct or injury and is unchanged since prior study. No mass-effect or midline shift. No abnormal extra-axial fluid collections. Gray-white matter junctions  are distinct. Basal cisterns are not effaced. No acute intracranial hemorrhage. Vascular: Mild intracranial arterial vascular calcifications. Skull: Calvarium appears intact. Sinuses/Orbits: Mucosal thickening in the paranasal sinuses. Bilateral ocular prostheses. Other: Congenital nonunion of the posterior arch of C1. IMPRESSION: No acute intracranial abnormalities. Chronic atrophy and small vessel ischemic changes. Old encephalomalacia in the frontal lobes. Electronically Signed   By: Lucienne Capers M.D.   On: 11/20/2021 17:23   PERIPHERAL VASCULAR CATHETERIZATION  Result Date: 11/21/2021 See surgical note for result.    Medications:    azithromycin 500 mg (11/21/21 2236)   cefTRIAXone (ROCEPHIN)  IV 2 g (11/22/21 1158)    aspirin EC  81 mg Oral Daily   Chlorhexidine Gluconate Cloth  6 each Topical Q0600   heparin  5,000 Units Subcutaneous Q8H   acetaminophen **OR** acetaminophen, ondansetron **OR** ondansetron (ZOFRAN) IV  Assessment/ Plan:  [name] is a 60 y.o.  male SNF resident with diabetes mellitus type II, paraplegia, GERD, blindness, glaucoma, hypertension and hyperlipidemia who was admitted to [facility] on 11/20/2021 for UTI (urinary tract infection) [N39.0] AKI (acute kidney injury) ([place]) [N17.9] Urinary tract infection without hematuria, site unspecified [N39.0] Pneumonia due to infectious organism, unspecified laterality, unspecified part of lung [J18.9]   Acute kidney injury on chronic kidney disease stage IV versus progression to Chronic kidney disease stage V: with maturing AVF. Placed on 12/16 Dr. Delana Meyer. Now with hyperkalemia, metabolic acidosis and uremia. Limited urine output - Given IV fluids.  -Appreciate vascular surgery placing a right femoral temp cath to perform dialysis.  Initial dialysis treatment on 11/21/2021. -Initial dialysis treatment tolerated well.  We will perform second treatment today with low UF of 0.5 L. - We will perform third dialysis  treatment tomorrow with patient seated in chair. - Plan to allow patient to rest over weekend and evaluate renal function and possible recovery.  This will determine if further dialysis is required.  Lab Results  Component Value Date   CREATININE 6.43 (H) 11/22/2021   CREATININE 7.49 (H) 11/21/2021   CREATININE 6.76 (H) 11/20/2021    Intake/Output Summary (Last 24 hours) at 11/22/2021 1323 Last data filed at 11/22/2021 1042 Gross per 24 hour  Intake 120 ml  Output -211 ml  Net 331 ml  Anemia of chronic kidney disease: hemoglobin of 8.6.   Hypertension: 130/70 Currently holding amlodipine, hydralazine, and losartan   Urinary tract infection: blood cultures positive with Klebsiella species. Continue empiric antibiotics with azithromycin and ceftriaxone      LOS: 1 Maurita Havener 12/29/20221:23 PM

## 2021-11-22 NOTE — Assessment & Plan Note (Addendum)
See problem, UTI (urinary tract infection)

## 2021-11-23 DIAGNOSIS — R338 Other retention of urine: Secondary | ICD-10-CM

## 2021-11-23 LAB — CULTURE, BLOOD (ROUTINE X 2): Special Requests: ADEQUATE

## 2021-11-23 LAB — HCV RNA QUANT: HCV Quantitative: NOT DETECTED IU/mL (ref >50)

## 2021-11-23 LAB — QUANTIFERON-TB GOLD PLUS: QuantiFERON-TB Gold Plus: NEGATIVE

## 2021-11-23 LAB — URINE CULTURE: Culture: >=100000 — AB

## 2021-11-23 LAB — QUANTIFERON-TB GOLD PLUS (RQFGPL)
QuantiFERON Mitogen Value: 1.61 IU/mL
QuantiFERON Nil Value: 0.31 IU/mL
QuantiFERON TB1 Ag Value: 0.32 IU/mL
QuantiFERON TB2 Ag Value: 0.34 IU/mL

## 2021-11-23 LAB — HEPATITIS B SURFACE ANTIBODY,QUALITATIVE: Hep B S Ab: REACTIVE — AB

## 2021-11-23 LAB — HEPATITIS B SURFACE ANTIGEN: Hepatitis B Surface Ag: NONREACTIVE

## 2021-11-23 MED ORDER — PENTAFLUOROPROP-TETRAFLUOROETH EX AERO
1.0000 "application " | INHALATION_SPRAY | CUTANEOUS | Status: DC | PRN
  Filled 2021-11-23: qty 30

## 2021-11-23 MED ORDER — HEPARIN SODIUM (PORCINE) 1000 UNIT/ML DIALYSIS
1000.0000 [IU] | INTRAMUSCULAR | Status: DC | PRN
  Administered 2021-11-23: 3200 [IU] via INTRAVENOUS_CENTRAL

## 2021-11-23 MED ORDER — HEPARIN SODIUM (PORCINE) 1000 UNIT/ML DIALYSIS
1000.0000 [IU] | INTRAMUSCULAR | Status: DC | PRN
  Administered 2021-11-23: 1000 [IU] via INTRAVENOUS_CENTRAL

## 2021-11-23 MED ORDER — CEFAZOLIN SODIUM-DEXTROSE 1-4 GM/50ML-% IV SOLN
1.0000 g | Freq: Once | INTRAVENOUS | Status: AC
  Administered 2021-11-23: 22:00:00 1 g via INTRAVENOUS
  Filled 2021-11-23 (×2): qty 50

## 2021-11-23 MED ORDER — CEFAZOLIN SODIUM-DEXTROSE 2-4 GM/100ML-% IV SOLN
2.0000 g | Freq: Two times a day (BID) | INTRAVENOUS | Status: DC
  Administered 2021-11-24 – 2021-11-26 (×5): 2 g via INTRAVENOUS
  Filled 2021-11-23 (×6): qty 100

## 2021-11-23 MED ORDER — CEFAZOLIN SODIUM-DEXTROSE 1-4 GM/50ML-% IV SOLN
1.0000 g | INTRAVENOUS | Status: DC
  Filled 2021-11-23: qty 50

## 2021-11-23 MED ORDER — SODIUM CHLORIDE 0.9 % IV SOLN: 100.0000 mL | INTRAVENOUS | Status: DC | PRN

## 2021-11-23 MED ORDER — HEPARIN SODIUM (PORCINE) 1000 UNIT/ML DIALYSIS
2000.0000 [IU] | INTRAMUSCULAR | Status: DC | PRN
  Administered 2021-11-23: 2000 [IU] via INTRAVENOUS_CENTRAL

## 2021-11-23 MED ORDER — LIDOCAINE HCL (PF) 1 % IJ SOLN
5.0000 mL | INTRAMUSCULAR | Status: DC | PRN
  Filled 2021-11-23: qty 5

## 2021-11-23 MED ORDER — LIDOCAINE-PRILOCAINE 2.5-2.5 % EX CREA
1.0000 "application " | TOPICAL_CREAM | CUTANEOUS | Status: DC | PRN
  Filled 2021-11-23: qty 5

## 2021-11-23 MED ORDER — HEPARIN SODIUM (PORCINE) 1000 UNIT/ML IJ SOLN
INTRAMUSCULAR | Status: AC
  Filled 2021-11-23: qty 10

## 2021-11-23 MED ORDER — ALTEPLASE 2 MG IJ SOLR: 2.0000 mg | Freq: Once | INTRAMUSCULAR | Status: DC | PRN

## 2021-11-23 NOTE — Assessment & Plan Note (Addendum)
11/21/2021: anti-HBc and anti-HBc reactive. HBsAg non-reactive.

## 2021-11-23 NOTE — Assessment & Plan Note (Addendum)
Unsure of etiology; possibly related to UTI. Severe retention. 1800 mL on bladder scan. Foley catheter placed on 12/30. -Attempt voiding trial in 2-3 days

## 2021-11-23 NOTE — Progress Notes (Signed)
Bladder scan revealed 1875. Notified Dr. Lonny Prude. Orders obtained to place foley catheter and drain 1,030ml then clamp foley for 1 hour, then release to let urine continue to drain. Spoke with Lorriane Shire in Dialysis to unclamp foley at 1140.

## 2021-11-23 NOTE — Progress Notes (Addendum)
Pharmacy Antibiotic Note  [name] is a 60 y.o. male admitted on 11/20/2021 with Kleb pneumoniae bacteremia. Pt was previously on ceftriaxone. Pharmacy has been consulted for cefazolin dosing.  Plan: Pt started on dialysis temporarily. Will order Cefazolin 1 g IV x 1 dose to be given after HD today (12/30). Per discussion with pharmacy resident, nephro planning to stop dialysis after today's session (12/30) and monitor for renal recovery. Will order Cefazolin 2 g IV q12h based on current crcl to start 12/31. Will monitor renal function and HD plans to assess further dose adjustment  Height: 6\' 1"  (185.4 cm) Weight: 82.3 kg (181 lb 7 oz) IBW/kg (Calculated) : 79.9  Temp (24hrs), Avg:100.6 F (38.1 C), Min:97.7 F (36.5 C), Max:102.9 F (39.4 C)  Recent Labs  Lab 11/20/21 1224 11/20/21 2048 11/21/21 0745 11/22/21 0517  WBC 10.3  --  13.3* 8.8  CREATININE 6.76*  --  7.49* 6.43*  LATICACIDVEN 1.4 1.1  --   --     Estimated Creatinine Clearance: 13.5 mL/min (A) (by C-G formula based on SCr of 6.43 mg/dL (H)).    No Known Allergies  Antimicrobials this admission: 12/27 ceftriaxone >> 12/29 12/28 azithromycin >> 12/31 12/30 cefazolin >>    Microbiology results: 12/27 BCx: Kleb pneumo (Ampicillin resistant) 12/27 UCx: Kleb pneumo (Ampicillin and nitrofurantoin resistant)   Thank you for allowing pharmacy to be a part of this patients care.  Forde Dandy Veronnica Hennings 11/23/2021 10:58 AM

## 2021-11-23 NOTE — Progress Notes (Signed)
pt wants to cover his face inspite educating him that during hd tx he should not cover his face. . Dr Carson Myrtle and Delphina Cahill also have seen pt and advised pt to remove the cover on his face but pt refused to take it off. Dr Mackey Birchwood order to proceed tx. Monitor pt closely.   11:40 as ordered, foley was unclamp.  1200 Urine noticed to be cloudy. Np shantelle was notified. She ordered to empty the bag to about 1 L. And clamp it again.Also she ordered that once pt will be going back to his room. Notify his nurse if they may want to get urine culture.   Pt is being monitored closely and talking to him  to check his mental status since he does not want to uncover his face.

## 2021-11-23 NOTE — Progress Notes (Signed)
PROGRESS NOTE    [name]  ATF:[id number] DOB: 03-09-1961 DOA: 11/20/2021 PCP: Elizabeth Palau, MD (Inactive)   Brief Narrative: [name] is a 60 y.o. male with medical history significant for DM, HTN, CKD stage IV s/p left brachial AV fistula placement on 11/09/2021 in preparation for dialysis. Patient presented secondary to altered mental status and fever, meeting severe sepsis criteria with evidence of UTI and possible pneumonia. Empiric antibiotics initiated. Blood and urine cultures obtained and are significant for Klebsiella infection.   Assessment & Plan:   * Severe sepsis ([place])- (present on admission) Secondary to likely UTI and possible pneumonia. Concern for bacteremia, likely gram negative. Started empirically on Ceftriaxone for UTI and Azithromycin for pneumonia. Zyvox started as well. Complicated by recently placed AV fistula. -Antibiotics, see problems UTI (urinary tract infection) and Pneumonia  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) Most recent creatine of 3.07 from earlier this month. Creatinine of 6.76 on admission. Likely secondary to dehydration. Also complicated by acute illness and hypotension. Nephrology consulted on admission. Started on IV fluids and has now started on hemodialysis. Femoral dialysis catheter placed by vascular surgery on 12/28. CT abdomen/pelvis suggests hydronephrosis which may be contributing. Hemodialysis started 12/29 -Nephrology recommendations: HD -Watch weights and in/out  UTI (urinary tract infection)- (present on admission) Urinalysis highly suggestive of infection. Started empirically on Ceftriaxone. Urine culture significant for Klebsiella. CT abdomen/pelvis (12/29) without evidence of abscess. pneumoniae, sensitive to cefazolin -Transition to Cefazolin IV  Acute metabolic encephalopathy-resolved as of 11/23/2021 Likely secondary to acute illness. CT head unremarkable for acute process.  Hepatitis C  antibody test positive -Follow-up HCV quant  Pneumonia Chest x-ray with possible LL lung interstitial pneumonia. Started empirically on Azithromycin. On Ceftriaxone for UTI -Continue Ceftriaxone and Azithromycin  Diabetes ([place]) Last hemoglobin A1C of 5.7% in 2011. Not on outpatient medication. Current hemoglobin A1C of 5.8%  Hyperkalemia-resolved as of 11/23/2021 Secondary to kidney function. Given Lokelma 10 g x1. Management per HD.  History of hepatitis B 11/21/2021: anti-HBc and anti-HBc reactive. HBsAg non-reactive.  Acute urinary retention Unsure of etiology; possibly related to UTI. Severe retention. 1800 mL on bladder scan. -Insert foley catheter  Bacteremia due to Klebsiella pneumoniae See problem, UTI (urinary tract infection)  Primary hypertension- (present on admission) Patient is on amlodipine, hydralazine and losartan as an outpatient. Hypotensive on admission. Antihypertensives held.   DVT prophylaxis: Heparin Code Status:   Code Status: Full Code Family Communication: None at bedside. Disposition Plan: Discharge back to facility likely in 2-5 days pending transition to outpatient antibiotics, nephrology recommendations for AKI/CKD   Consultants:  Nephrology  Procedures:  Hemodialysis  Antimicrobials: Ceftriaxone Azithromycin Linezolid    Subjective: Continues to be cold. No abdominal pain.  Objective: Vitals:   11/22/21 2133 11/22/21 2245 11/23/21 0354 11/23/21 0752  BP: 109/62  92/63 107/70  Pulse: (!) 105  75 78  Resp: 20  18 18   Temp: (!) 101.3 F (38.5 C) 98.3 F (36.8 C) 97.7 F (36.5 C)   TempSrc: Oral  Tympanic   SpO2: 95%  99% 99%  Weight:      Height:        Intake/Output Summary (Last 24 hours) at 11/23/2021 1023 Last data filed at 11/23/2021 0538 Gross per 24 hour  Intake 963.77 ml  Output 801 ml  Net 162.77 ml    Filed Weights   11/22/21 0500 11/22/21 1830 11/22/21 2045  Weight: 81.4 kg 83.4 kg 82.3 kg  Examination:  General exam: Appears calm and comfortable and in no acute distress. Conversant Respiratory: Clear to auscultation. Respiratory effort normal with no intercostal retractions or use of accessory muscles Cardiovascular: S1 & S2 heard, RRR. No murmurs, rubs, gallops or clicks. No LE edema Gastrointestinal: Abdomen is significantly distended, firm and tender especially on lower quadrants. Likely enlarged bladder palpated. Normal bowel sounds heard Neurologic: No focal neurological deficits Musculoskeletal: No calf tenderness Skin: No cyanosis. No new rashes Psychiatry: Alert and oriented. Memory intact. Mood & affect appropriate   Data Reviewed: I have personally reviewed following labs and imaging studies  CBC Lab Results  Component Value Date   WBC 8.8 11/22/2021   RBC 3.15 (L) 11/22/2021   HGB 8.6 (L) 11/22/2021   HCT 25.3 (L) 11/22/2021   MCV 80.3 11/22/2021   MCH 27.3 11/22/2021   PLT 133 (L) 11/22/2021   MCHC 34.0 11/22/2021   RDW 14.4 11/22/2021   LYMPHSABS 1.1 11/20/2021   MONOABS 1.2 (H) 11/20/2021   EOSABS 0.0 11/20/2021   BASOSABS 0.0 16/11/930     Last metabolic panel Lab Results  Component Value Date   NA 133 (L) 11/22/2021   K 4.6 11/22/2021   CL 99 11/22/2021   CO2 21 (L) 11/22/2021   BUN 84 (H) 11/22/2021   CREATININE 6.43 (H) 11/22/2021   GLUCOSE 113 (H) 11/22/2021   GFRNONAA 9 (L) 11/22/2021   GFRAA >60 07/12/2011   CALCIUM 8.3 (L) 11/22/2021   PHOS 4.4 11/22/2021   PROT 7.4 11/20/2021   ALBUMIN 2.4 (L) 11/22/2021   BILITOT 0.7 11/20/2021   ALKPHOS 51 11/20/2021   AST 60 (H) 11/20/2021   ALT 43 11/20/2021   ANIONGAP 13 11/22/2021    CBG (last 3)  No results for input(s): GLUCAP in the last 72 hours.   GFR: Estimated Creatinine Clearance: 13.5 mL/min (A) (by C-G formula based on SCr of 6.43 mg/dL (H)).  Coagulation Profile: Recent Labs  Lab 11/21/21 0745  INR 1.3*     Recent Results (from the past 240 hour(s))   Resp Panel by RT-PCR (Flu A&B, Covid) Nasopharyngeal Swab     Status: None   Collection Time: 11/20/21  8:48 PM   Specimen: Nasopharyngeal Swab; Nasopharyngeal(NP) swabs in vial transport medium  Result Value Ref Range Status   SARS Coronavirus 2 by RT PCR NEGATIVE NEGATIVE Final    Comment: (NOTE) SARS-CoV-2 target nucleic acids are NOT DETECTED.  The SARS-CoV-2 RNA is generally detectable in upper respiratory specimens during the acute phase of infection. The lowest concentration of SARS-CoV-2 viral copies this assay can detect is 138 copies/mL. A negative result does not preclude SARS-Cov-2 infection and should not be used as the sole basis for treatment or other patient management decisions. A negative result may occur with  improper specimen collection/handling, submission of specimen other than nasopharyngeal swab, presence of viral mutation(s) within the areas targeted by this assay, and inadequate number of viral copies(<138 copies/mL). A negative result must be combined with clinical observations, patient history, and epidemiological information. The expected result is Negative.  Fact Sheet for Patients:  EntrepreneurPulse.com.au  Fact Sheet for Healthcare Providers:  IncredibleEmployment.be  This test is no t yet approved or cleared by the [place] FDA and  has been authorized for detection and/or diagnosis of SARS-CoV-2 by FDA under an Emergency Use Authorization (EUA). This EUA will remain  in effect (meaning this test can be used) for the duration of the COVID-19 declaration under Section 564(b)(1) of the  Act, 21 U.S.C.section 360bbb-3(b)(1), unless the authorization is terminated  or revoked sooner.       Influenza A by PCR NEGATIVE NEGATIVE Final   Influenza B by PCR NEGATIVE NEGATIVE Final    Comment: (NOTE) The Xpert Xpress SARS-CoV-2/FLU/RSV plus assay is intended as an aid in the diagnosis of influenza from  Nasopharyngeal swab specimens and should not be used as a sole basis for treatment. Nasal washings and aspirates are unacceptable for Xpert Xpress SARS-CoV-2/FLU/RSV testing.  Fact Sheet for Patients: EntrepreneurPulse.com.au  Fact Sheet for Healthcare Providers: IncredibleEmployment.be  This test is not yet approved or cleared by the [place] FDA and has been authorized for detection and/or diagnosis of SARS-CoV-2 by FDA under an Emergency Use Authorization (EUA). This EUA will remain in effect (meaning this test can be used) for the duration of the COVID-19 declaration under Section 564(b)(1) of the Act, 21 U.S.C. section 360bbb-3(b)(1), unless the authorization is terminated or revoked.  Performed at [facility], [address] [place], [place] [address]   Blood culture (routine x 2)     Status: Abnormal   Collection Time: 11/20/21 11:32 PM   Specimen: BLOOD  Result Value Ref Range Status   Specimen Description   Final    BLOOD RIGHT FOREARM Performed at [facility], [address] [place], [place] [address]    Special Requests   Final    BOTTLES DRAWN AEROBIC AND ANAEROBIC Blood Culture results may not be optimal due to an excessive volume of blood received in culture bottles Performed at [facility], [address] [place], [place] [address]    Culture  Setup Time   Final    GRAM NEGATIVE RODS IN BOTH AEROBIC AND ANAEROBIC BOTTLES CRITICAL VALUE NOTED.  VALUE IS CONSISTENT WITH PREVIOUSLY REPORTED AND CALLED VALUE. Performed at [facility], [address] [place], [place] [address]    Culture (A)  Final    KLEBSIELLA PNEUMONIAE SUSCEPTIBILITIES PERFORMED ON PREVIOUS CULTURE WITHIN THE LAST 5 DAYS. Performed at [facility], [place] [address], [place], [place] [address]    Report Status 11/23/2021 FINAL  Final  Blood culture (routine x 2)     Status: Abnormal   Collection  Time: 11/20/21 11:32 PM   Specimen: BLOOD  Result Value Ref Range Status   Specimen Description   Final    BLOOD RIGHT ASSIST CONTROL Performed at [facility], [address], [place], [place] [address]    Special Requests   Final    BOTTLES DRAWN AEROBIC AND ANAEROBIC Blood Culture adequate volume Performed at [facility], [address] [place], [place] [address]    Culture  Setup Time   Final    GRAM NEGATIVE RODS IN BOTH AEROBIC AND ANAEROBIC BOTTLES Organism ID to follow CRITICAL RESULT CALLED TO, READ BACK BY AND VERIFIED WITHArdeen Garland PHARMD [id number] 11/21/21 HNM Performed at [facility], [address] [place],  [address]    Culture KLEBSIELLA PNEUMONIAE (A)  Final   Report Status 11/23/2021 FINAL  Final   Organism ID, Bacteria KLEBSIELLA PNEUMONIAE  Final      Susceptibility   Klebsiella pneumoniae - MIC*    AMPICILLIN RESISTANT Resistant     CEFAZOLIN <=4 SENSITIVE Sensitive     CEFEPIME <=0.12 SENSITIVE Sensitive     CEFTAZIDIME <=1 SENSITIVE Sensitive     CEFTRIAXONE <=0.25 SENSITIVE Sensitive     CIPROFLOXACIN <=0.25 SENSITIVE Sensitive     GENTAMICIN <=  1 SENSITIVE Sensitive     IMIPENEM <=0.25 SENSITIVE Sensitive     TRIMETH/SULFA <=20 SENSITIVE Sensitive     AMPICILLIN/SULBACTAM 4 SENSITIVE Sensitive     PIP/TAZO <=4 SENSITIVE Sensitive     * KLEBSIELLA PNEUMONIAE  Blood Culture ID Panel (Reflexed)     Status: Abnormal   Collection Time: 11/20/21 11:32 PM  Result Value Ref Range Status   Enterococcus faecalis NOT DETECTED NOT DETECTED Final   Enterococcus Faecium NOT DETECTED NOT DETECTED Final   Listeria monocytogenes NOT DETECTED NOT DETECTED Final   Staphylococcus species NOT DETECTED NOT DETECTED Final   Staphylococcus aureus (BCID) NOT DETECTED NOT DETECTED Final   Staphylococcus epidermidis NOT DETECTED NOT DETECTED Final   Staphylococcus lugdunensis NOT DETECTED NOT DETECTED Final   Streptococcus species NOT  DETECTED NOT DETECTED Final   Streptococcus agalactiae NOT DETECTED NOT DETECTED Final   Streptococcus pneumoniae NOT DETECTED NOT DETECTED Final   Streptococcus pyogenes NOT DETECTED NOT DETECTED Final   A.calcoaceticus-baumannii NOT DETECTED NOT DETECTED Final   Bacteroides fragilis NOT DETECTED NOT DETECTED Final   Enterobacterales DETECTED (A) NOT DETECTED Final    Comment: Enterobacterales represent a large order of gram negative bacteria, not a single organism. CRITICAL RESULT CALLED TO, READ BACK BY AND VERIFIED WITH: MORGAN HICKS PHARMD [id number] 11/21/21 HNM    Enterobacter cloacae complex NOT DETECTED NOT DETECTED Final   Escherichia coli NOT DETECTED NOT DETECTED Final   Klebsiella aerogenes NOT DETECTED NOT DETECTED Final   Klebsiella oxytoca NOT DETECTED NOT DETECTED Final   Klebsiella pneumoniae DETECTED (A) NOT DETECTED Final    Comment: CRITICAL RESULT CALLED TO, READ BACK BY AND VERIFIED WITH: MORGAN HICKS PHARMD 1039 11/21/21 HNM    Proteus species NOT DETECTED NOT DETECTED Final   Salmonella species NOT DETECTED NOT DETECTED Final   Serratia marcescens NOT DETECTED NOT DETECTED Final   Haemophilus influenzae NOT DETECTED NOT DETECTED Final   Neisseria meningitidis NOT DETECTED NOT DETECTED Final   Pseudomonas aeruginosa NOT DETECTED NOT DETECTED Final   Stenotrophomonas maltophilia NOT DETECTED NOT DETECTED Final   Candida albicans NOT DETECTED NOT DETECTED Final   Candida auris NOT DETECTED NOT DETECTED Final   Candida glabrata NOT DETECTED NOT DETECTED Final   Candida krusei NOT DETECTED NOT DETECTED Final   Candida parapsilosis NOT DETECTED NOT DETECTED Final   Candida tropicalis NOT DETECTED NOT DETECTED Final   Cryptococcus neoformans/gattii NOT DETECTED NOT DETECTED Final   CTX-M ESBL NOT DETECTED NOT DETECTED Final   Carbapenem resistance IMP NOT DETECTED NOT DETECTED Final   Carbapenem resistance KPC NOT DETECTED NOT DETECTED Final   Carbapenem resistance NDM  NOT DETECTED NOT DETECTED Final   Carbapenem resist OXA 48 LIKE NOT DETECTED NOT DETECTED Final   Carbapenem resistance VIM NOT DETECTED NOT DETECTED Final    Comment: Performed at [facility], [address] [place], [place] [address]  Urine Culture     Status: Abnormal   Collection Time: 11/20/21 11:33 PM   Specimen: Urine, Random  Result Value Ref Range Status   Specimen Description   Final    URINE, RANDOM Performed at [facility], [address], [place], [place] [address]    Special Requests   Final    NONE Performed at [facility], [address] [place], [place] [address]    Culture >=100,000 COLONIES/mL KLEBSIELLA PNEUMONIAE (A)  Final   Report Status 11/23/2021 FINAL  Final   Organism ID, Bacteria KLEBSIELLA PNEUMONIAE (A)  Final      Susceptibility   Klebsiella pneumoniae - MIC*    AMPICILLIN RESISTANT Resistant     CEFAZOLIN <=4 SENSITIVE Sensitive     CEFEPIME <=0.12 SENSITIVE Sensitive     CEFTRIAXONE <=0.25 SENSITIVE Sensitive     CIPROFLOXACIN <=0.25 SENSITIVE Sensitive     GENTAMICIN <=1 SENSITIVE Sensitive     IMIPENEM <=0.25 SENSITIVE Sensitive     NITROFURANTOIN 128 RESISTANT Resistant     TRIMETH/SULFA <=20 SENSITIVE Sensitive     AMPICILLIN/SULBACTAM 4 SENSITIVE Sensitive     PIP/TAZO <=4 SENSITIVE Sensitive     * >=100,000 COLONIES/mL KLEBSIELLA PNEUMONIAE         Radiology Studies: CT ABDOMEN PELVIS WO CONTRAST  Result Date: 11/22/2021 CLINICAL DATA:  Septic workup, suspect urosepsis, pyelonephritis. EXAM: CT ABDOMEN AND PELVIS WITHOUT IV CONTRAST (oral contrast only) TECHNIQUE: Multidetector CT imaging of the abdomen and pelvis was performed following the standard protocol without IV contrast. COMPARISON:  CT with oral contrast only 07/04/2009. FINDINGS: Lower chest: There are trace pleural effusions. Posterior atelectasis in the lung bases without infiltrates. Mild cardiomegaly and small anterior pericardial  effusion. There is unilateral left gynecomastia. Hepatobiliary: 18 cm in length slightly steatotic liver. No focal liver abnormality is seen without contrast and accounting for respiratory motion artifact and artifact from the patient's arms in the field. The gallbladder bile ducts are unremarkable. Pancreas: No mass is seen without contrast or ductal dilatation. Spleen: Unremarkable without contrast. Adrenals/Urinary Tract: Stable 1.6 cm left adrenal adenoma. The right adrenal is unremarkable. No focal abnormality seen of unenhanced renal cortex. There is increased bilateral perinephric stranding and moderate bilateral hydroureteronephrosis extending to the bladder. No urinary stone is seen but the bladder is markedly distended with the dome reaching as high as the level of L3-4. No focal bladder thickening is evident. Stomach/Bowel: The gastric wall, small bowel are unremarkable. The appendix contains contrast or scattered stones and is normal caliber. There is moderate stool retention in the colon. There are diverticula without evidence of a focal colitis or diverticulitis. Vascular/Lymphatic: Aortic atherosclerosis. No enlarged abdominal or pelvic lymph nodes. There patchy calcifications in the arteries. Right femoral approach double lumen catheter terminates in the IVC, L4-5 level. IVC filter in place. Reproductive: Mildly prominent prostate. Other: Mild generalized body wall anasarca is seen, mild mesenteric congestive features, with minimal ascites in the abdomen and pelvis. There is no free air, hemorrhage or abscess. Musculoskeletal: Again noted chronic left pelvic and proximal left femoral fracture fixation hardware, with extensive heterotopic bone formation about the left hip, with healed left pubic rami fracture deformities and ankylosis across both SI joints. There are degenerative changes in the lumbar spine with bridging osteophytes at L3-4. There is no worrisome regional bone lesion. IMPRESSION: 1.  Dilatation of the bladder with upstream bilateral moderate hydroureteronephrosis. No stones are seen. No focal bladder thickening. Mild prostatomegaly. 2. Body wall edema with mild mesenteric congestive features and minimal ascites. 3. Trace pleural effusions, small anterior pericardial fluid. 4. Mild cardiomegaly, with aortic atherosclerosis. 5. Constipation and diverticulosis. No bowel obstruction or inflammation. Normal caliber appendix with contrast or stones within. 6. Unilateral left gynecomastia. Electronically Signed   By: Telford Nab M.D.   On: 11/22/2021 22:39   PERIPHERAL VASCULAR CATHETERIZATION  Result Date: 11/21/2021 See surgical note for result.       Scheduled Meds:  aspirin EC  81 mg Oral Daily   Chlorhexidine Gluconate Cloth  6 each Topical Q0600   heparin  5,000  Units Subcutaneous Q8H   pregabalin  25 mg Oral Daily   Continuous Infusions:  sodium chloride     sodium chloride     azithromycin Stopped (11/23/21 0021)   cefTRIAXone (ROCEPHIN)  IV Stopped (11/22/21 1232)     LOS: 2 days     Cordelia Poche, MD [facility] 11/23/2021, 10:23 AM  If 7PM-7AM, please contact night-coverage [email or web address]

## 2021-11-23 NOTE — Progress Notes (Signed)
HD completed. Tolerated HD well. No complications during tx. Pt was asymptomatic. Total UF removed. 0.5L

## 2021-11-23 NOTE — Progress Notes (Signed)
Received consult for femoral HD removal per nephrology request. Plan is for VAS team to wait until after shift change to remove due to required close monitoring after removal to ensure he doesn't experience any complications. Contacted floor RN to inform her of plan and she was was in agreement. She will notify oncoming shift of plan.   Wrenley Sayed [place] Officer, RN

## 2021-11-23 NOTE — Progress Notes (Signed)
[facility]  ROUNDING NOTE   Subjective:   Patient seen and evaluated during dialysis   HEMODIALYSIS FLOWSHEET:  Blood Flow Rate (mL/min): 300 mL/min Arterial Pressure (mmHg): -110 mmHg Venous Pressure (mmHg): 100 mmHg Transmembrane Pressure (mmHg): 60 mmHg Ultrafiltration Rate (mL/min): 330 mL/min Dialysate Flow Rate (mL/min): 500 ml/min Conductivity: Machine : 13.9 Conductivity: Machine : 13.9 Dialysis Fluid Bolus: Normal Saline Bolus Amount (mL): 250 mL  Dialysis performed in chair today Refusing to remove blankets from face   Objective:  Vital signs in last 24 hours:  Temp:  [97.7 F (36.5 C)-102.9 F (39.4 C)] 99 F (37.2 C) (12/30 1118) Pulse Rate:  [75-105] 81 (12/30 1315) Resp:  [13-21] 13 (12/30 1315) BP: (91-170)/(62-112) 120/63 (12/30 1315) SpO2:  [94 %-100 %] 98 % (12/30 1315) Weight:  [82.3 kg-83.4 kg] 82.3 kg (12/29 2045)  Weight change: 0 kg Filed Weights   11/22/21 0500 11/22/21 1830 11/22/21 2045  Weight: 81.4 kg 83.4 kg 82.3 kg    Intake/Output: I/O last 3 completed shifts: In: 963.8 [P.O.:360; IV Piggyback:603.8] Out: 836 [Urine:300; Other:501]   Intake/Output this shift:  No intake/output data recorded.  Physical Exam: General: NAD, seated in chair  Head: Normocephalic, atraumatic. Moist oral mucosal membranes  Eyes: Anicteric  Lungs:  Clear to auscultation  Heart: Regular rate and rhythm  Abdomen:  Soft, nontender  Extremities:  trace peripheral edema.  Neurologic: Nonfocal, moving all four extremities  Skin: No lesions  Access: Rt femoral temp cath placed on 11/21/21  GU-Foley catheter  Basic Metabolic Panel: Recent Labs  Lab 11/20/21 1224 11/21/21 0745 11/21/21 1610 11/22/21 0517  NA 131* 135  --  133*  K 5.0 5.7*  --  4.6  CL 100 106  --  99  CO2 20* 16*  --  21*  GLUCOSE 161* 125*  --  113*  BUN 94* 105*  --  84*  CREATININE 6.76* 7.49*  --  6.43*  CALCIUM 9.1 8.6*  --  8.3*  PHOS  --   --  4.3 4.4      Liver Function Tests: Recent Labs  Lab 11/20/21 1224 11/22/21 0517  AST 60*  --   ALT 43  --   ALKPHOS 51  --   BILITOT 0.7  --   PROT 7.4  --   ALBUMIN 3.3* 2.4*    No results for input(s): LIPASE, AMYLASE in the last 168 hours. No results for input(s): AMMONIA in the last 168 hours.  CBC: Recent Labs  Lab 11/20/21 1224 11/21/21 0745 11/22/21 0517  WBC 10.3 13.3* 8.8  NEUTROABS 8.0*  --   --   HGB 11.0* 9.6* 8.6*  HCT 34.0* 29.1* 25.3*  MCV 84.2 82.2 80.3  PLT 175 154 133*     Cardiac Enzymes: No results for input(s): CKTOTAL, CKMB, CKMBINDEX, TROPONINI in the last 168 hours.  BNP: Invalid input(s): POCBNP  CBG: No results for input(s): GLUCAP in the last 168 hours.  Microbiology: Results for orders placed or performed during the hospital encounter of 11/20/21  Resp Panel by RT-PCR (Flu A&B, Covid) Nasopharyngeal Swab     Status: None   Collection Time: 11/20/21  8:48 PM   Specimen: Nasopharyngeal Swab; Nasopharyngeal(NP) swabs in vial transport medium  Result Value Ref Range Status   SARS Coronavirus 2 by RT PCR NEGATIVE NEGATIVE Final    Comment: (NOTE) SARS-CoV-2 target nucleic acids are NOT DETECTED.  The SARS-CoV-2 RNA is generally detectable in upper respiratory specimens during the acute  phase of infection. The lowest concentration of SARS-CoV-2 viral copies this assay can detect is 138 copies/mL. A negative result does not preclude SARS-Cov-2 infection and should not be used as the sole basis for treatment or other patient management decisions. A negative result may occur with  improper specimen collection/handling, submission of specimen other than nasopharyngeal swab, presence of viral mutation(s) within the areas targeted by this assay, and inadequate number of viral copies(<138 copies/mL). A negative result must be combined with clinical observations, patient history, and epidemiological information. The expected result is  Negative.  Fact Sheet for Patients:  EntrepreneurPulse.com.au  Fact Sheet for Healthcare Providers:  IncredibleEmployment.be  This test is no t yet approved or cleared by the [place] FDA and  has been authorized for detection and/or diagnosis of SARS-CoV-2 by FDA under an Emergency Use Authorization (EUA). This EUA will remain  in effect (meaning this test can be used) for the duration of the COVID-19 declaration under Section 564(b)(1) of the Act, 21 U.S.C.section 360bbb-3(b)(1), unless the authorization is terminated  or revoked sooner.       Influenza A by PCR NEGATIVE NEGATIVE Final   Influenza B by PCR NEGATIVE NEGATIVE Final    Comment: (NOTE) The Xpert Xpress SARS-CoV-2/FLU/RSV plus assay is intended as an aid in the diagnosis of influenza from Nasopharyngeal swab specimens and should not be used as a sole basis for treatment. Nasal washings and aspirates are unacceptable for Xpert Xpress SARS-CoV-2/FLU/RSV testing.  Fact Sheet for Patients: EntrepreneurPulse.com.au  Fact Sheet for Healthcare Providers: IncredibleEmployment.be  This test is not yet approved or cleared by the [place] FDA and has been authorized for detection and/or diagnosis of SARS-CoV-2 by FDA under an Emergency Use Authorization (EUA). This EUA will remain in effect (meaning this test can be used) for the duration of the COVID-19 declaration under Section 564(b)(1) of the Act, 21 U.S.C. section 360bbb-3(b)(1), unless the authorization is terminated or revoked.  Performed at [facility], [address] [place], [place] [address]   Blood culture (routine x 2)     Status: Abnormal   Collection Time: 11/20/21 11:32 PM   Specimen: BLOOD  Result Value Ref Range Status   Specimen Description   Final    BLOOD RIGHT FOREARM Performed at [facility], [address], [place], [place] [address]     Special Requests   Final    BOTTLES DRAWN AEROBIC AND ANAEROBIC Blood Culture results may not be optimal due to an excessive volume of blood received in culture bottles Performed at [facility], [address], [place], [place] [address]    Culture  Setup Time   Final    GRAM NEGATIVE RODS IN BOTH AEROBIC AND ANAEROBIC BOTTLES CRITICAL VALUE NOTED.  VALUE IS CONSISTENT WITH PREVIOUSLY REPORTED AND CALLED VALUE. Performed at [facility], [address] [place], [place] [address]    Culture (A)  Final    KLEBSIELLA PNEUMONIAE SUSCEPTIBILITIES PERFORMED ON PREVIOUS CULTURE WITHIN THE LAST 5 DAYS. Performed at [facility], [place] [address] [place], [place] [address]    Report Status 11/23/2021 FINAL  Final  Blood culture (routine x 2)     Status: Abnormal   Collection Time: 11/20/21 11:32 PM   Specimen: BLOOD  Result Value Ref Range Status   Specimen Description   Final    BLOOD RIGHT ASSIST CONTROL Performed at [facility], [address] [place], [place] [address]    Special Requests  Final    BOTTLES DRAWN AEROBIC AND ANAEROBIC Blood Culture adequate volume Performed at [facility], [address] [place], [place] [address]    Culture  Setup Time   Final    GRAM NEGATIVE RODS IN BOTH AEROBIC AND ANAEROBIC BOTTLES Organism ID to follow CRITICAL RESULT CALLED TO, READ BACK BY AND VERIFIED WITHArdeen Garland PHARMD [id number] 11/21/21 HNM Performed at [facility], [address] [place], [place] [address]    Culture KLEBSIELLA PNEUMONIAE (A)  Final   Report Status 11/23/2021 FINAL  Final   Organism ID, Bacteria KLEBSIELLA PNEUMONIAE  Final      Susceptibility   Klebsiella pneumoniae - MIC*    AMPICILLIN RESISTANT Resistant     CEFAZOLIN <=4 SENSITIVE Sensitive     CEFEPIME <=0.12 SENSITIVE Sensitive     CEFTAZIDIME <=1 SENSITIVE Sensitive     CEFTRIAXONE <=0.25 SENSITIVE Sensitive     CIPROFLOXACIN <=0.25 SENSITIVE  Sensitive     GENTAMICIN <=1 SENSITIVE Sensitive     IMIPENEM <=0.25 SENSITIVE Sensitive     TRIMETH/SULFA <=20 SENSITIVE Sensitive     AMPICILLIN/SULBACTAM 4 SENSITIVE Sensitive     PIP/TAZO <=4 SENSITIVE Sensitive     * KLEBSIELLA PNEUMONIAE  Blood Culture ID Panel (Reflexed)     Status: Abnormal   Collection Time: 11/20/21 11:32 PM  Result Value Ref Range Status   Enterococcus faecalis NOT DETECTED NOT DETECTED Final   Enterococcus Faecium NOT DETECTED NOT DETECTED Final   Listeria monocytogenes NOT DETECTED NOT DETECTED Final   Staphylococcus species NOT DETECTED NOT DETECTED Final   Staphylococcus aureus (BCID) NOT DETECTED NOT DETECTED Final   Staphylococcus epidermidis NOT DETECTED NOT DETECTED Final   Staphylococcus lugdunensis NOT DETECTED NOT DETECTED Final   Streptococcus species NOT DETECTED NOT DETECTED Final   Streptococcus agalactiae NOT DETECTED NOT DETECTED Final   Streptococcus pneumoniae NOT DETECTED NOT DETECTED Final   Streptococcus pyogenes NOT DETECTED NOT DETECTED Final   A.calcoaceticus-baumannii NOT DETECTED NOT DETECTED Final   Bacteroides fragilis NOT DETECTED NOT DETECTED Final   Enterobacterales DETECTED (A) NOT DETECTED Final    Comment: Enterobacterales represent a large order of gram negative bacteria, not a single organism. CRITICAL RESULT CALLED TO, READ BACK BY AND VERIFIED WITH: MORGAN HICKS PHARMD [id number] 11/21/21 HNM    Enterobacter cloacae complex NOT DETECTED NOT DETECTED Final   Escherichia coli NOT DETECTED NOT DETECTED Final   Klebsiella aerogenes NOT DETECTED NOT DETECTED Final   Klebsiella oxytoca NOT DETECTED NOT DETECTED Final   Klebsiella pneumoniae DETECTED (A) NOT DETECTED Final    Comment: CRITICAL RESULT CALLED TO, READ BACK BY AND VERIFIED WITH: MORGAN HICKS PHARMD 1039 11/21/21 HNM    Proteus species NOT DETECTED NOT DETECTED Final   Salmonella species NOT DETECTED NOT DETECTED Final   Serratia marcescens NOT DETECTED NOT  DETECTED Final   Haemophilus influenzae NOT DETECTED NOT DETECTED Final   Neisseria meningitidis NOT DETECTED NOT DETECTED Final   Pseudomonas aeruginosa NOT DETECTED NOT DETECTED Final   Stenotrophomonas maltophilia NOT DETECTED NOT DETECTED Final   Candida albicans NOT DETECTED NOT DETECTED Final   Candida auris NOT DETECTED NOT DETECTED Final   Candida glabrata NOT DETECTED NOT DETECTED Final   Candida krusei NOT DETECTED NOT DETECTED Final   Candida parapsilosis NOT DETECTED NOT DETECTED Final   Candida tropicalis NOT DETECTED NOT DETECTED Final   Cryptococcus neoformans/gattii NOT DETECTED NOT DETECTED Final   CTX-M ESBL NOT DETECTED NOT DETECTED Final  Carbapenem resistance IMP NOT DETECTED NOT DETECTED Final   Carbapenem resistance KPC NOT DETECTED NOT DETECTED Final   Carbapenem resistance NDM NOT DETECTED NOT DETECTED Final   Carbapenem resist OXA 48 LIKE NOT DETECTED NOT DETECTED Final   Carbapenem resistance VIM NOT DETECTED NOT DETECTED Final    Comment: Performed at [facility], [address] [place], [place] [address]  Urine Culture     Status: Abnormal   Collection Time: 11/20/21 11:33 PM   Specimen: Urine, Random  Result Value Ref Range Status   Specimen Description   Final    URINE, RANDOM Performed at [facility], [address] [place], [place] [address]    Special Requests   Final    NONE Performed at [facility], [address] [place], [place] [address]    Culture >=100,000 COLONIES/mL KLEBSIELLA PNEUMONIAE (A)  Final   Report Status 11/23/2021 FINAL  Final   Organism ID, Bacteria KLEBSIELLA PNEUMONIAE (A)  Final      Susceptibility   Klebsiella pneumoniae - MIC*    AMPICILLIN RESISTANT Resistant     CEFAZOLIN <=4 SENSITIVE Sensitive     CEFEPIME <=0.12 SENSITIVE Sensitive     CEFTRIAXONE <=0.25 SENSITIVE Sensitive     CIPROFLOXACIN <=0.25 SENSITIVE Sensitive     GENTAMICIN <=1 SENSITIVE Sensitive     IMIPENEM  <=0.25 SENSITIVE Sensitive     NITROFURANTOIN 128 RESISTANT Resistant     TRIMETH/SULFA <=20 SENSITIVE Sensitive     AMPICILLIN/SULBACTAM 4 SENSITIVE Sensitive     PIP/TAZO <=4 SENSITIVE Sensitive     * >=100,000 COLONIES/mL KLEBSIELLA PNEUMONIAE    Coagulation Studies: Recent Labs    11/21/21 0745  LABPROT 16.6*  INR 1.3*     Urinalysis: Recent Labs    11/20/21 2333  COLORURINE YELLOW*  LABSPEC 1.010  PHURINE 6.0  GLUCOSEU NEGATIVE  HGBUR MODERATE*  BILIRUBINUR NEGATIVE  KETONESUR NEGATIVE  PROTEINUR 100*  NITRITE POSITIVE*  LEUKOCYTESUR LARGE*       Imaging: CT ABDOMEN PELVIS WO CONTRAST  Result Date: 11/22/2021 CLINICAL DATA:  Septic workup, suspect urosepsis, pyelonephritis. EXAM: CT ABDOMEN AND PELVIS WITHOUT IV CONTRAST (oral contrast only) TECHNIQUE: Multidetector CT imaging of the abdomen and pelvis was performed following the standard protocol without IV contrast. COMPARISON:  CT with oral contrast only 07/04/2009. FINDINGS: Lower chest: There are trace pleural effusions. Posterior atelectasis in the lung bases without infiltrates. Mild cardiomegaly and small anterior pericardial effusion. There is unilateral left gynecomastia. Hepatobiliary: 18 cm in length slightly steatotic liver. No focal liver abnormality is seen without contrast and accounting for respiratory motion artifact and artifact from the patient's arms in the field. The gallbladder bile ducts are unremarkable. Pancreas: No mass is seen without contrast or ductal dilatation. Spleen: Unremarkable without contrast. Adrenals/Urinary Tract: Stable 1.6 cm left adrenal adenoma. The right adrenal is unremarkable. No focal abnormality seen of unenhanced renal cortex. There is increased bilateral perinephric stranding and moderate bilateral hydroureteronephrosis extending to the bladder. No urinary stone is seen but the bladder is markedly distended with the dome reaching as high as the level of L3-4. No focal  bladder thickening is evident. Stomach/Bowel: The gastric wall, small bowel are unremarkable. The appendix contains contrast or scattered stones and is normal caliber. There is moderate stool retention in the colon. There are diverticula without evidence of a focal colitis or diverticulitis. Vascular/Lymphatic: Aortic atherosclerosis. No enlarged abdominal or pelvic lymph nodes. There patchy calcifications in the arteries. Right femoral approach double  lumen catheter terminates in the IVC, L4-5 level. IVC filter in place. Reproductive: Mildly prominent prostate. Other: Mild generalized body wall anasarca is seen, mild mesenteric congestive features, with minimal ascites in the abdomen and pelvis. There is no free air, hemorrhage or abscess. Musculoskeletal: Again noted chronic left pelvic and proximal left femoral fracture fixation hardware, with extensive heterotopic bone formation about the left hip, with healed left pubic rami fracture deformities and ankylosis across both SI joints. There are degenerative changes in the lumbar spine with bridging osteophytes at L3-4. There is no worrisome regional bone lesion. IMPRESSION: 1. Dilatation of the bladder with upstream bilateral moderate hydroureteronephrosis. No stones are seen. No focal bladder thickening. Mild prostatomegaly. 2. Body wall edema with mild mesenteric congestive features and minimal ascites. 3. Trace pleural effusions, small anterior pericardial fluid. 4. Mild cardiomegaly, with aortic atherosclerosis. 5. Constipation and diverticulosis. No bowel obstruction or inflammation. Normal caliber appendix with contrast or stones within. 6. Unilateral left gynecomastia. Electronically Signed   By: Telford Nab M.D.   On: 11/22/2021 22:39   PERIPHERAL VASCULAR CATHETERIZATION  Result Date: 11/21/2021 See surgical note for result.    Medications:    sodium chloride     sodium chloride     azithromycin Stopped (11/23/21 0021)    ceFAZolin (ANCEF)  IV     [START ON 11/24/2021]  ceFAZolin (ANCEF) IV      heparin sodium (porcine)       aspirin EC  81 mg Oral Daily   Chlorhexidine Gluconate Cloth  6 each Topical Q0600   heparin  5,000 Units Subcutaneous Q8H   pregabalin  25 mg Oral Daily   sodium chloride, sodium chloride, acetaminophen **OR** acetaminophen, alteplase, heparin, heparin, heparin, lidocaine (PF), lidocaine-prilocaine, ondansetron **OR** ondansetron (ZOFRAN) IV, oxyCODONE, pentafluoroprop-tetrafluoroeth  Assessment/ Plan:  [name] is a 60 y.o.  male SNF resident with diabetes mellitus type II, paraplegia, GERD, blindness, glaucoma, hypertension and hyperlipidemia who was admitted to [facility] on 11/20/2021 for UTI (urinary tract infection) [N39.0] AKI (acute kidney injury) ([place]) [N17.9] Urinary tract infection without hematuria, site unspecified [N39.0] Pneumonia due to infectious organism, unspecified laterality, unspecified part of lung [J18.9]   Acute kidney injury on chronic kidney disease stage IV versus progression to Chronic kidney disease stage V: with maturing AVF. Placed on 12/16 Dr. Delana Meyer. Now with hyperkalemia, metabolic acidosis and uremia. Limited urine output - CT abd/pelvis shows hydronephrosis and bladder scan showed fluid retention.  Foley catheter placed -Appreciate vascular surgery placing a right femoral temp cath to perform dialysis.  Initial dialysis treatment on 11/21/2021. -Second treatment yesterday, tolerated fair. Patient reportedly gets agitated towards end of treatment. Dialyzing today in chair.  - Plan to remove temp cath today after dialysis, due to infection. Will evaluate renal recovery over the weekend and determine continued need for dialysis.  Lab Results  Component Value Date   CREATININE 6.43 (H) 11/22/2021   CREATININE 7.49 (H) 11/21/2021   CREATININE 6.76 (H) 11/20/2021    Intake/Output Summary (Last 24 hours) at 11/23/2021 1334 Last data filed at 11/23/2021  0538 Gross per 24 hour  Intake 843.77 ml  Output 801 ml  Net 42.77 ml    Anemia of chronic kidney disease: hemoglobin of 8.6.   Hypertension:  Currently holding amlodipine, hydralazine, and losartan. BP 119/69 during dialysis   Urinary tract infection: blood cultures positive with Klebsiella species. Continue empiric antibiotics with azithromycin and ceftriaxone      LOS: 2 Camaya [employer] 12/30/20221:34 PM

## 2021-11-24 LAB — RENAL FUNCTION PANEL
Albumin: 2.4 g/dL — ABNORMAL LOW (ref 3.5–5.0)
Anion gap: 11 (ref 5–15)
BUN: 43 mg/dL — ABNORMAL HIGH (ref 8–23)
CO2: 26 mmol/L (ref 22–32)
Calcium: 8.2 mg/dL — ABNORMAL LOW (ref 8.9–10.3)
Chloride: 102 mmol/L (ref 98–111)
Creatinine, Ser: 3.66 mg/dL — ABNORMAL HIGH (ref 0.61–1.24)
GFR, Estimated: 18 mL/min — ABNORMAL LOW (ref >60)
Glucose, Bld: 116 mg/dL — ABNORMAL HIGH (ref 70–99)
Phosphorus: 3.8 mg/dL (ref 2.5–4.6)
Potassium: 3.5 mmol/L (ref 3.5–5.1)
Sodium: 139 mmol/L (ref 135–145)

## 2021-11-24 LAB — CBC
HCT: 25.9 % — ABNORMAL LOW (ref 39.0–52.0)
Hemoglobin: 8.6 g/dL — ABNORMAL LOW (ref 13.0–17.0)
MCH: 26.8 pg (ref 26.0–34.0)
MCHC: 33.2 g/dL (ref 30.0–36.0)
MCV: 80.7 fL (ref 80.0–100.0)
Platelets: 185 10*3/uL (ref 150–400)
RBC: 3.21 MIL/uL — ABNORMAL LOW (ref 4.22–5.81)
RDW: 14.3 % (ref 11.5–15.5)
WBC: 6.9 10*3/uL (ref 4.0–10.5)
nRBC: 0 % (ref 0.0–0.2)

## 2021-11-24 LAB — HEPATITIS B SURFACE ANTIBODY, QUANTITATIVE: Hep B S AB Quant (Post): 596.8 m[IU]/mL (ref >9.9)

## 2021-11-24 LAB — CREATININE, SERUM
Creatinine, Ser: 3.73 mg/dL — ABNORMAL HIGH (ref 0.61–1.24)
GFR, Estimated: 18 mL/min — ABNORMAL LOW (ref >60)

## 2021-11-24 MED ORDER — POLYETHYLENE GLYCOL 3350 17 G PO PACK
17.0000 g | PACK | Freq: Every day | ORAL | Status: DC
Start: 2021-11-24 — End: 2021-11-26
  Administered 2021-11-24 – 2021-11-26 (×3): 17 g via ORAL
  Filled 2021-11-24 (×3): qty 1

## 2021-11-24 MED ORDER — FERROUS SULFATE 325 (65 FE) MG PO TABS
325.0000 mg | ORAL_TABLET | Freq: Every day | ORAL | Status: DC
  Administered 2021-11-24 – 2021-11-26 (×3): 325 mg via ORAL
  Filled 2021-11-24 (×3): qty 1

## 2021-11-24 NOTE — Progress Notes (Signed)
[facility]  ROUNDING NOTE   Subjective:   Patient seen today on second floor Patient resting comfortably. Patient offers no new specific physical complaints   Objective:  Vital signs in last 24 hours:  Temp:  [98.5 F (36.9 C)-99.3 F (37.4 C)] 99.3 F (37.4 C) (12/31 0821) Pulse Rate:  [74-97] 74 (12/31 0821) Resp:  [11-19] 16 (12/31 0821) BP: (100-136)/(58-83) 115/71 (12/31 0821) SpO2:  [95 %-100 %] 100 % (12/31 0821)  Weight change:  Filed Weights   11/22/21 0500 11/22/21 1830 11/22/21 2045  Weight: 81.4 kg 83.4 kg 82.3 kg    Intake/Output: I/O last 3 completed shifts: In: 843.8 [P.O.:240; IV Piggyback:603.8] Out: 1301 [Urine:300; Other:1001]   Intake/Output this shift:  No intake/output data recorded.  Physical Exam: General: NAD, seated in chair  Head: Normocephalic, atraumatic. Moist oral mucosal membranes  Eyes: Anicteric  Lungs:  Clear to auscultation  Heart: Regular rate and rhythm  Abdomen:  Soft, nontender  Extremities:  trace peripheral edema.  Neurologic: Nonfocal, moving all four extremities  Skin: No lesions  Access: Rt femoral temp cath placed on 11/21/21 now DC'd  GU-Foley catheter  Basic Metabolic Panel: Recent Labs  Lab 11/20/21 1224 11/21/21 0745 11/21/21 1610 11/22/21 0517 11/24/21 0429 11/24/21 0918  NA 131* 135  --  133*  --  139  K 5.0 5.7*  --  4.6  --  3.5  CL 100 106  --  99  --  102  CO2 20* 16*  --  21*  --  26  GLUCOSE 161* 125*  --  113*  --  116*  BUN 94* 105*  --  84*  --  43*  CREATININE 6.76* 7.49*  --  6.43* 3.73* 3.66*  CALCIUM 9.1 8.6*  --  8.3*  --  8.2*  PHOS  --   --  4.3 4.4  --  3.8    Liver Function Tests: Recent Labs  Lab 11/20/21 1224 11/22/21 0517 11/24/21 0918  AST 60*  --   --   ALT 43  --   --   ALKPHOS 51  --   --   BILITOT 0.7  --   --   PROT 7.4  --   --   ALBUMIN 3.3* 2.4* 2.4*   No results for input(s): LIPASE, AMYLASE in the last 168 hours. No results for input(s):  AMMONIA in the last 168 hours.  CBC: Recent Labs  Lab 11/20/21 1224 11/21/21 0745 11/22/21 0517 11/24/21 0918  WBC 10.3 13.3* 8.8 6.9  NEUTROABS 8.0*  --   --   --   HGB 11.0* 9.6* 8.6* 8.6*  HCT 34.0* 29.1* 25.3* 25.9*  MCV 84.2 82.2 80.3 80.7  PLT 175 154 133* 185    Cardiac Enzymes: No results for input(s): CKTOTAL, CKMB, CKMBINDEX, TROPONINI in the last 168 hours.  BNP: Invalid input(s): POCBNP  CBG: No results for input(s): GLUCAP in the last 168 hours.  Microbiology: Results for orders placed or performed during the hospital encounter of 11/20/21  Resp Panel by RT-PCR (Flu A&B, Covid) Nasopharyngeal Swab     Status: None   Collection Time: 11/20/21  8:48 PM   Specimen: Nasopharyngeal Swab; Nasopharyngeal(NP) swabs in vial transport medium  Result Value Ref Range Status   SARS Coronavirus 2 by RT PCR NEGATIVE NEGATIVE Final    Comment: (NOTE) SARS-CoV-2 target nucleic acids are NOT DETECTED.  The SARS-CoV-2 RNA is generally detectable in upper respiratory specimens during the acute phase of  infection. The lowest concentration of SARS-CoV-2 viral copies this assay can detect is 138 copies/mL. A negative result does not preclude SARS-Cov-2 infection and should not be used as the sole basis for treatment or other patient management decisions. A negative result may occur with  improper specimen collection/handling, submission of specimen other than nasopharyngeal swab, presence of viral mutation(s) within the areas targeted by this assay, and inadequate number of viral copies(<138 copies/mL). A negative result must be combined with clinical observations, patient history, and epidemiological information. The expected result is Negative.  Fact Sheet for Patients:  EntrepreneurPulse.com.au  Fact Sheet for Healthcare Providers:  IncredibleEmployment.be  This test is no t yet approved or cleared by the [place] FDA and  has  been authorized for detection and/or diagnosis of SARS-CoV-2 by FDA under an Emergency Use Authorization (EUA). This EUA will remain  in effect (meaning this test can be used) for the duration of the COVID-19 declaration under Section 564(b)(1) of the Act, 21 U.S.C.section 360bbb-3(b)(1), unless the authorization is terminated  or revoked sooner.       Influenza A by PCR NEGATIVE NEGATIVE Final   Influenza B by PCR NEGATIVE NEGATIVE Final    Comment: (NOTE) The Xpert Xpress SARS-CoV-2/FLU/RSV plus assay is intended as an aid in the diagnosis of influenza from Nasopharyngeal swab specimens and should not be used as a sole basis for treatment. Nasal washings and aspirates are unacceptable for Xpert Xpress SARS-CoV-2/FLU/RSV testing.  Fact Sheet for Patients: EntrepreneurPulse.com.au  Fact Sheet for Healthcare Providers: IncredibleEmployment.be  This test is not yet approved or cleared by the [place] FDA and has been authorized for detection and/or diagnosis of SARS-CoV-2 by FDA under an Emergency Use Authorization (EUA). This EUA will remain in effect (meaning this test can be used) for the duration of the COVID-19 declaration under Section 564(b)(1) of the Act, 21 U.S.C. section 360bbb-3(b)(1), unless the authorization is terminated or revoked.  Performed at [facility], [address] [place], [place] [address]   Blood culture (routine x 2)     Status: Abnormal   Collection Time: 11/20/21 11:32 PM   Specimen: BLOOD  Result Value Ref Range Status   Specimen Description   Final    BLOOD RIGHT FOREARM Performed at [facility], [address] [place], [place] [address]    Special Requests   Final    BOTTLES DRAWN AEROBIC AND ANAEROBIC Blood Culture results may not be optimal due to an excessive volume of blood received in culture bottles Performed at [facility], [address], [place], [place]  [address]    Culture  Setup Time   Final    GRAM NEGATIVE RODS IN BOTH AEROBIC AND ANAEROBIC BOTTLES CRITICAL VALUE NOTED.  VALUE IS CONSISTENT WITH PREVIOUSLY REPORTED AND CALLED VALUE. Performed at [facility], [address] [place], [place] [address]    Culture (A)  Final    KLEBSIELLA PNEUMONIAE SUSCEPTIBILITIES PERFORMED ON PREVIOUS CULTURE WITHIN THE LAST 5 DAYS. Performed at [facility], [place] [address] [place], [place] [address]    Report Status 11/23/2021 FINAL  Final  Blood culture (routine x 2)     Status: Abnormal   Collection Time: 11/20/21 11:32 PM   Specimen: BLOOD  Result Value Ref Range Status   Specimen Description   Final    BLOOD RIGHT ASSIST CONTROL Performed at [facility], [address], [place], [place] [address]    Special Requests   Final  BOTTLES DRAWN AEROBIC AND ANAEROBIC Blood Culture adequate volume Performed at [facility], [address] [place], [place] [address]    Culture  Setup Time   Final    GRAM NEGATIVE RODS IN BOTH AEROBIC AND ANAEROBIC BOTTLES Organism ID to follow CRITICAL RESULT CALLED TO, READ BACK BY AND VERIFIED WITHArdeen Garland PHARMD [id number] 11/21/21 HNM Performed at [facility], [address] [place], [place] [address]    Culture KLEBSIELLA PNEUMONIAE (A)  Final   Report Status 11/23/2021 FINAL  Final   Organism ID, Bacteria KLEBSIELLA PNEUMONIAE  Final      Susceptibility   Klebsiella pneumoniae - MIC*    AMPICILLIN RESISTANT Resistant     CEFAZOLIN <=4 SENSITIVE Sensitive     CEFEPIME <=0.12 SENSITIVE Sensitive     CEFTAZIDIME <=1 SENSITIVE Sensitive     CEFTRIAXONE <=0.25 SENSITIVE Sensitive     CIPROFLOXACIN <=0.25 SENSITIVE Sensitive     GENTAMICIN <=1 SENSITIVE Sensitive     IMIPENEM <=0.25 SENSITIVE Sensitive     TRIMETH/SULFA <=20 SENSITIVE Sensitive     AMPICILLIN/SULBACTAM 4 SENSITIVE Sensitive     PIP/TAZO <=4 SENSITIVE Sensitive     * KLEBSIELLA  PNEUMONIAE  Blood Culture ID Panel (Reflexed)     Status: Abnormal   Collection Time: 11/20/21 11:32 PM  Result Value Ref Range Status   Enterococcus faecalis NOT DETECTED NOT DETECTED Final   Enterococcus Faecium NOT DETECTED NOT DETECTED Final   Listeria monocytogenes NOT DETECTED NOT DETECTED Final   Staphylococcus species NOT DETECTED NOT DETECTED Final   Staphylococcus aureus (BCID) NOT DETECTED NOT DETECTED Final   Staphylococcus epidermidis NOT DETECTED NOT DETECTED Final   Staphylococcus lugdunensis NOT DETECTED NOT DETECTED Final   Streptococcus species NOT DETECTED NOT DETECTED Final   Streptococcus agalactiae NOT DETECTED NOT DETECTED Final   Streptococcus pneumoniae NOT DETECTED NOT DETECTED Final   Streptococcus pyogenes NOT DETECTED NOT DETECTED Final   A.calcoaceticus-baumannii NOT DETECTED NOT DETECTED Final   Bacteroides fragilis NOT DETECTED NOT DETECTED Final   Enterobacterales DETECTED (A) NOT DETECTED Final    Comment: Enterobacterales represent a large order of gram negative bacteria, not a single organism. CRITICAL RESULT CALLED TO, READ BACK BY AND VERIFIED WITH: MORGAN HICKS PHARMD [id number] 11/21/21 HNM    Enterobacter cloacae complex NOT DETECTED NOT DETECTED Final   Escherichia coli NOT DETECTED NOT DETECTED Final   Klebsiella aerogenes NOT DETECTED NOT DETECTED Final   Klebsiella oxytoca NOT DETECTED NOT DETECTED Final   Klebsiella pneumoniae DETECTED (A) NOT DETECTED Final    Comment: CRITICAL RESULT CALLED TO, READ BACK BY AND VERIFIED WITH: MORGAN HICKS PHARMD 1039 11/21/21 HNM    Proteus species NOT DETECTED NOT DETECTED Final   Salmonella species NOT DETECTED NOT DETECTED Final   Serratia marcescens NOT DETECTED NOT DETECTED Final   Haemophilus influenzae NOT DETECTED NOT DETECTED Final   Neisseria meningitidis NOT DETECTED NOT DETECTED Final   Pseudomonas aeruginosa NOT DETECTED NOT DETECTED Final   Stenotrophomonas maltophilia NOT DETECTED NOT  DETECTED Final   Candida albicans NOT DETECTED NOT DETECTED Final   Candida auris NOT DETECTED NOT DETECTED Final   Candida glabrata NOT DETECTED NOT DETECTED Final   Candida krusei NOT DETECTED NOT DETECTED Final   Candida parapsilosis NOT DETECTED NOT DETECTED Final   Candida tropicalis NOT DETECTED NOT DETECTED Final   Cryptococcus neoformans/gattii NOT DETECTED NOT DETECTED Final   CTX-M ESBL NOT DETECTED NOT DETECTED Final   Carbapenem resistance IMP NOT  DETECTED NOT DETECTED Final   Carbapenem resistance KPC NOT DETECTED NOT DETECTED Final   Carbapenem resistance NDM NOT DETECTED NOT DETECTED Final   Carbapenem resist OXA 48 LIKE NOT DETECTED NOT DETECTED Final   Carbapenem resistance VIM NOT DETECTED NOT DETECTED Final    Comment: Performed at [facility], [address] [place], [place] [address]  Urine Culture     Status: Abnormal   Collection Time: 11/20/21 11:33 PM   Specimen: Urine, Random  Result Value Ref Range Status   Specimen Description   Final    URINE, RANDOM Performed at [facility], [address], [place], [place] [address]    Special Requests   Final    NONE Performed at [facility], [address] [place], [place] [address]    Culture >=100,000 COLONIES/mL KLEBSIELLA PNEUMONIAE (A)  Final   Report Status 11/23/2021 FINAL  Final   Organism ID, Bacteria KLEBSIELLA PNEUMONIAE (A)  Final      Susceptibility   Klebsiella pneumoniae - MIC*    AMPICILLIN RESISTANT Resistant     CEFAZOLIN <=4 SENSITIVE Sensitive     CEFEPIME <=0.12 SENSITIVE Sensitive     CEFTRIAXONE <=0.25 SENSITIVE Sensitive     CIPROFLOXACIN <=0.25 SENSITIVE Sensitive     GENTAMICIN <=1 SENSITIVE Sensitive     IMIPENEM <=0.25 SENSITIVE Sensitive     NITROFURANTOIN 128 RESISTANT Resistant     TRIMETH/SULFA <=20 SENSITIVE Sensitive     AMPICILLIN/SULBACTAM 4 SENSITIVE Sensitive     PIP/TAZO <=4 SENSITIVE Sensitive     * >=100,000 COLONIES/mL KLEBSIELLA  PNEUMONIAE    Coagulation Studies: No results for input(s): LABPROT, INR in the last 72 hours.   Urinalysis: No results for input(s): COLORURINE, LABSPEC, PHURINE, GLUCOSEU, HGBUR, BILIRUBINUR, KETONESUR, PROTEINUR, UROBILINOGEN, NITRITE, LEUKOCYTESUR in the last 72 hours.  Invalid input(s): APPERANCEUR    Imaging: CT ABDOMEN PELVIS WO CONTRAST  Result Date: 11/22/2021 CLINICAL DATA:  Septic workup, suspect urosepsis, pyelonephritis. EXAM: CT ABDOMEN AND PELVIS WITHOUT IV CONTRAST (oral contrast only) TECHNIQUE: Multidetector CT imaging of the abdomen and pelvis was performed following the standard protocol without IV contrast. COMPARISON:  CT with oral contrast only 07/04/2009. FINDINGS: Lower chest: There are trace pleural effusions. Posterior atelectasis in the lung bases without infiltrates. Mild cardiomegaly and small anterior pericardial effusion. There is unilateral left gynecomastia. Hepatobiliary: 18 cm in length slightly steatotic liver. No focal liver abnormality is seen without contrast and accounting for respiratory motion artifact and artifact from the patient's arms in the field. The gallbladder bile ducts are unremarkable. Pancreas: No mass is seen without contrast or ductal dilatation. Spleen: Unremarkable without contrast. Adrenals/Urinary Tract: Stable 1.6 cm left adrenal adenoma. The right adrenal is unremarkable. No focal abnormality seen of unenhanced renal cortex. There is increased bilateral perinephric stranding and moderate bilateral hydroureteronephrosis extending to the bladder. No urinary stone is seen but the bladder is markedly distended with the dome reaching as high as the level of L3-4. No focal bladder thickening is evident. Stomach/Bowel: The gastric wall, small bowel are unremarkable. The appendix contains contrast or scattered stones and is normal caliber. There is moderate stool retention in the colon. There are diverticula without evidence of a focal colitis  or diverticulitis. Vascular/Lymphatic: Aortic atherosclerosis. No enlarged abdominal or pelvic lymph nodes. There patchy calcifications in the arteries. Right femoral approach double lumen catheter terminates in the IVC, L4-5 level. IVC filter in place. Reproductive: Mildly prominent prostate. Other: Mild generalized body wall anasarca is seen,  mild mesenteric congestive features, with minimal ascites in the abdomen and pelvis. There is no free air, hemorrhage or abscess. Musculoskeletal: Again noted chronic left pelvic and proximal left femoral fracture fixation hardware, with extensive heterotopic bone formation about the left hip, with healed left pubic rami fracture deformities and ankylosis across both SI joints. There are degenerative changes in the lumbar spine with bridging osteophytes at L3-4. There is no worrisome regional bone lesion. IMPRESSION: 1. Dilatation of the bladder with upstream bilateral moderate hydroureteronephrosis. No stones are seen. No focal bladder thickening. Mild prostatomegaly. 2. Body wall edema with mild mesenteric congestive features and minimal ascites. 3. Trace pleural effusions, small anterior pericardial fluid. 4. Mild cardiomegaly, with aortic atherosclerosis. 5. Constipation and diverticulosis. No bowel obstruction or inflammation. Normal caliber appendix with contrast or stones within. 6. Unilateral left gynecomastia. Electronically Signed   By: Telford Nab M.D.   On: 11/22/2021 22:39     Medications:    azithromycin 500 mg (11/23/21 2354)    ceFAZolin (ANCEF) IV 2 g (11/24/21 0541)    aspirin EC  81 mg Oral Daily   Chlorhexidine Gluconate Cloth  6 each Topical Q0600   ferrous sulfate  325 mg Oral Q breakfast   heparin  5,000 Units Subcutaneous Q8H   pregabalin  25 mg Oral Daily   acetaminophen **OR** acetaminophen, ondansetron **OR** ondansetron (ZOFRAN) IV, oxyCODONE  Assessment/ Plan:  [name] is a 60 y.o.  male SNF resident with diabetes  mellitus type II, paraplegia, GERD, blindness, glaucoma, hypertension and hyperlipidemia who was admitted to [facility] on 11/20/2021 for UTI (urinary tract infection) [N39.0] AKI (acute kidney injury) ([place]) [N17.9] Urinary tract infection without hematuria, site unspecified [N39.0] Pneumonia due to infectious organism, unspecified laterality, unspecified part of lung [J18.9]   Acute kidney injury on chronic kidney disease stage IV versus progression to Chronic kidney disease stage V: with maturing AVF. Placed on 12/16 Dr. Delana Meyer.  Patient was admitted with hyperkalemia, metabolic acidosis and uremia. Patient required renal replacement therapy Patient had a temporary catheter placed and first dialysis treatment was performed on November 21, 2021 Patient last dialysis treatment was performed on November 23, 2021 Patient temporary catheter has since been removed Patient's radiological work-up showed-CT abd/pelvis shows hydronephrosis and bladder scan showed fluid retention.   Patient had Foley catheter placed No need for renal replacement therapy We will continue to evaluate renal recovery over the weekend and determine continued need for dialysis.  Lab Results  Component Value Date   CREATININE 3.66 (H) 11/24/2021   CREATININE 3.73 (H) 11/24/2021   CREATININE 6.43 (H) 11/22/2021    Intake/Output Summary (Last 24 hours) at 11/24/2021 0948 Last data filed at 11/23/2021 1440 Gross per 24 hour  Intake --  Output 500 ml  Net -500 ml   Anemia of chronic kidney disease:  Hemoglobin is not at goal Patient has iron deficiency anemia as patient saturation was low at 7% on December 28 With the patient's active infection we will hold off on giving IV iron We will give patient p.o. iron for now   Hypertension:   Hypertension Patient blood pressure is at goal   Urinary tract infection:  blood cultures positive with Klebsiella species. Continue empiric antibiotics with azithromycin and  ceftriaxone  Plan Patient creatinine is stable No need for acute renal replacement therapy today We will start patient on oral iron      LOS: 3 Azelie Noguera s Leea Rambeau 12/31/20229:48 AM

## 2021-11-24 NOTE — Progress Notes (Signed)
PROGRESS NOTE    [name]  ZOX:[id number] DOB: 1960-12-03 DOA: 11/20/2021 PCP: Elizabeth Palau, MD (Inactive)   Brief Narrative: [name] is a 60 y.o. male with medical history significant for DM, HTN, CKD stage IV s/p left brachial AV fistula placement on 11/09/2021 in preparation for dialysis. Patient presented secondary to altered mental status and fever, meeting severe sepsis criteria with evidence of UTI and possible pneumonia. Empiric antibiotics initiated. Blood and urine cultures obtained and are significant for Klebsiella infection. Continue IV antibiotics. Patient also found to have significant urinary retention; urinary foley catheter placed.   Assessment & Plan:   * Severe sepsis ([place])- (present on admission) Secondary to likely UTI and possible pneumonia. Initial concern for bacteremia, likely gram negative. Started empirically on Ceftriaxone for UTI and Azithromycin for pneumonia. Zyvox started as well. Complicated by recently placed AV fistula. Confirmed Klebsiella pneumoniae UTI and bacteremia. -Antibiotics, see problems UTI (urinary tract infection) and Pneumonia  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) Most recent creatine of 3.07 from earlier this month. Creatinine of 6.76 on admission. Likely secondary to dehydration. Also complicated by acute illness and hypotension. Nephrology consulted on admission. Started on IV fluids and has now started on hemodialysis. Femoral dialysis catheter placed by vascular surgery on 12/28. CT abdomen/pelvis suggests hydronephrosis which may be contributing. Hemodialysis started 12/29; last session on 12/30. Femoral HD catheter removed on 12/30. AKI improving. -Nephrology recommendations: continue to evaluate for renal recovery -Watch weights and in/out  UTI (urinary tract infection)- (present on admission) Urinalysis highly suggestive of infection. Started empirically on Ceftriaxone. Urine culture significant  for Klebsiella. CT abdomen/pelvis (12/29) without evidence of abscess. pneumoniae, sensitive to cefazolin. Now afebrile. -Continue Cefazolin IV  Acute metabolic encephalopathy-resolved as of 11/23/2021 Likely secondary to acute illness. CT head unremarkable for acute process.  History of hepatitis C Negative HCV quant.  Pneumonia Chest x-ray with possible LL lung interstitial pneumonia. Started empirically on Azithromycin. On Ceftriaxone for UTI. No symptoms. Treated empirically with 3 days of Ceftriaxone and Azithromycin.  Diabetes ([place]) Last hemoglobin A1C of 5.7% in 2011. Not on outpatient medication. Current hemoglobin A1C of 5.8%  Hyperkalemia-resolved as of 11/23/2021 Secondary to kidney function. Given Lokelma 10 g x1. Management per HD.  History of hepatitis B 11/21/2021: anti-HBc and anti-HBc reactive. HBsAg non-reactive.  Acute urinary retention Unsure of etiology; possibly related to UTI. Severe retention. 1800 mL on bladder scan. Foley catheter placed on 12/30. -Attempt voiding trial in 2-3 days  Bacteremia due to Klebsiella pneumoniae See problem, UTI (urinary tract infection)  Primary hypertension- (present on admission) Patient is on amlodipine, hydralazine and losartan as an outpatient. Hypotensive on admission. Antihypertensives held. Blood pressure stable.   DVT prophylaxis: Heparin Code Status:   Code Status: Full Code Family Communication: None at bedside. Disposition Plan: Discharge back to facility likely in 2-5 days pending transition to outpatient antibiotics, nephrology recommendations for AKI/CKD   Consultants:  Nephrology  Procedures:  Hemodialysis  Antimicrobials: Ceftriaxone Azithromycin Linezolid    Subjective: No concerns today.  Objective: Vitals:   11/23/21 1609 11/23/21 2015 11/24/21 0508 11/24/21 0821  BP: 127/68 (!) 109/58 112/65 115/71  Pulse: 91 86 78 74  Resp: 18 17 18 16   Temp:  98.5 F (36.9 C) 98.5 F (36.9 C) 99.3  F (37.4 C)  TempSrc:  Oral Oral Oral  SpO2: 97% 95% 96% 100%  Weight:      Height:        Intake/Output  Summary (Last 24 hours) at 11/24/2021 1153 Last data filed at 11/23/2021 1440 Gross per 24 hour  Intake --  Output 500 ml  Net -500 ml    Filed Weights   11/22/21 0500 11/22/21 1830 11/22/21 2045  Weight: 81.4 kg 83.4 kg 82.3 kg    Examination:  General exam: Appears calm and comfortable Respiratory system: Clear to auscultation. Respiratory effort normal. Cardiovascular system: S1 & S2 heard, RRR. No murmurs, rubs, gallops or clicks. Gastrointestinal system: Abdomen is nondistended, soft and nontender. No organomegaly or masses felt. Normal bowel sounds heard. Central nervous system: Alert and oriented. Musculoskeletal: No edema. No calf tenderness Skin: No cyanosis. No rashes Psychiatry: Judgement and insight appear normal. Mood & affect appropriate.    Data Reviewed: I have personally reviewed following labs and imaging studies  CBC Lab Results  Component Value Date   WBC 6.9 11/24/2021   RBC 3.21 (L) 11/24/2021   HGB 8.6 (L) 11/24/2021   HCT 25.9 (L) 11/24/2021   MCV 80.7 11/24/2021   MCH 26.8 11/24/2021   PLT 185 11/24/2021   MCHC 33.2 11/24/2021   RDW 14.3 11/24/2021   LYMPHSABS 1.1 11/20/2021   MONOABS 1.2 (H) 11/20/2021   EOSABS 0.0 11/20/2021   BASOSABS 0.0 50/07/3817     Last metabolic panel Lab Results  Component Value Date   NA 139 11/24/2021   K 3.5 11/24/2021   CL 102 11/24/2021   CO2 26 11/24/2021   BUN 43 (H) 11/24/2021   CREATININE 3.66 (H) 11/24/2021   GLUCOSE 116 (H) 11/24/2021   GFRNONAA 18 (L) 11/24/2021   GFRAA >60 07/12/2011   CALCIUM 8.2 (L) 11/24/2021   PHOS 3.8 11/24/2021   PROT 7.4 11/20/2021   ALBUMIN 2.4 (L) 11/24/2021   BILITOT 0.7 11/20/2021   ALKPHOS 51 11/20/2021   AST 60 (H) 11/20/2021   ALT 43 11/20/2021   ANIONGAP 11 11/24/2021    CBG (last 3)  No results for input(s): GLUCAP in the last 72 hours.    GFR: Estimated Creatinine Clearance: 23.6 mL/min (A) (by C-G formula based on SCr of 3.66 mg/dL (H)).  Coagulation Profile: Recent Labs  Lab 11/21/21 0745  INR 1.3*     Recent Results (from the past 240 hour(s))  Resp Panel by RT-PCR (Flu A&B, Covid) Nasopharyngeal Swab     Status: None   Collection Time: 11/20/21  8:48 PM   Specimen: Nasopharyngeal Swab; Nasopharyngeal(NP) swabs in vial transport medium  Result Value Ref Range Status   SARS Coronavirus 2 by RT PCR NEGATIVE NEGATIVE Final    Comment: (NOTE) SARS-CoV-2 target nucleic acids are NOT DETECTED.  The SARS-CoV-2 RNA is generally detectable in upper respiratory specimens during the acute phase of infection. The lowest concentration of SARS-CoV-2 viral copies this assay can detect is 138 copies/mL. A negative result does not preclude SARS-Cov-2 infection and should not be used as the sole basis for treatment or other patient management decisions. A negative result may occur with  improper specimen collection/handling, submission of specimen other than nasopharyngeal swab, presence of viral mutation(s) within the areas targeted by this assay, and inadequate number of viral copies(<138 copies/mL). A negative result must be combined with clinical observations, patient history, and epidemiological information. The expected result is Negative.  Fact Sheet for Patients:  EntrepreneurPulse.com.au  Fact Sheet for Healthcare Providers:  IncredibleEmployment.be  This test is no t yet approved or cleared by the [place] FDA and  has been authorized for detection and/or diagnosis of SARS-CoV-2  by FDA under an Emergency Use Authorization (EUA). This EUA will remain  in effect (meaning this test can be used) for the duration of the COVID-19 declaration under Section 564(b)(1) of the Act, 21 U.S.C.section 360bbb-3(b)(1), unless the authorization is terminated  or revoked sooner.        Influenza A by PCR NEGATIVE NEGATIVE Final   Influenza B by PCR NEGATIVE NEGATIVE Final    Comment: (NOTE) The Xpert Xpress SARS-CoV-2/FLU/RSV plus assay is intended as an aid in the diagnosis of influenza from Nasopharyngeal swab specimens and should not be used as a sole basis for treatment. Nasal washings and aspirates are unacceptable for Xpert Xpress SARS-CoV-2/FLU/RSV testing.  Fact Sheet for Patients: EntrepreneurPulse.com.au  Fact Sheet for Healthcare Providers: IncredibleEmployment.be  This test is not yet approved or cleared by the [place] FDA and has been authorized for detection and/or diagnosis of SARS-CoV-2 by FDA under an Emergency Use Authorization (EUA). This EUA will remain in effect (meaning this test can be used) for the duration of the COVID-19 declaration under Section 564(b)(1) of the Act, 21 U.S.C. section 360bbb-3(b)(1), unless the authorization is terminated or revoked.  Performed at [facility], [address] [place], [place] [address]   Blood culture (routine x 2)     Status: Abnormal   Collection Time: 11/20/21 11:32 PM   Specimen: BLOOD  Result Value Ref Range Status   Specimen Description   Final    BLOOD RIGHT FOREARM Performed at [facility], [address], [place], [place] [address]    Special Requests   Final    BOTTLES DRAWN AEROBIC AND ANAEROBIC Blood Culture results may not be optimal due to an excessive volume of blood received in culture bottles Performed at [facility], [address], [place], [place] [address]    Culture  Setup Time   Final    GRAM NEGATIVE RODS IN BOTH AEROBIC AND ANAEROBIC BOTTLES CRITICAL VALUE NOTED.  VALUE IS CONSISTENT WITH PREVIOUSLY REPORTED AND CALLED VALUE. Performed at [facility], [address] [place], [place] [address]    Culture (A)  Final    KLEBSIELLA PNEUMONIAE SUSCEPTIBILITIES PERFORMED ON PREVIOUS  CULTURE WITHIN THE LAST 5 DAYS. Performed at [facility], [address], [place], [place] [address]    Report Status 11/23/2021 FINAL  Final  Blood culture (routine x 2)     Status: Abnormal   Collection Time: 11/20/21 11:32 PM   Specimen: BLOOD  Result Value Ref Range Status   Specimen Description   Final    BLOOD RIGHT ASSIST CONTROL Performed at [facility], [address], [place], [place] [address]    Special Requests   Final    BOTTLES DRAWN AEROBIC AND ANAEROBIC Blood Culture adequate volume Performed at [facility], [address], [place], [place] [address]    Culture  Setup Time   Final    GRAM NEGATIVE RODS IN BOTH AEROBIC AND ANAEROBIC BOTTLES Organism ID to follow CRITICAL RESULT CALLED TO, READ BACK BY AND VERIFIED WITHArdeen [facility] 5277 11/21/21 HNM Performed at [facility], [address] [place], [place] [address]    Culture KLEBSIELLA PNEUMONIAE (A)  Final   Report Status 11/23/2021 FINAL  Final   Organism ID, Bacteria KLEBSIELLA PNEUMONIAE  Final      Susceptibility   Klebsiella pneumoniae - MIC*    AMPICILLIN RESISTANT Resistant     CEFAZOLIN <=4 SENSITIVE Sensitive  CEFEPIME <=0.12 SENSITIVE Sensitive     CEFTAZIDIME <=1 SENSITIVE Sensitive     CEFTRIAXONE <=0.25 SENSITIVE Sensitive     CIPROFLOXACIN <=0.25 SENSITIVE Sensitive     GENTAMICIN <=1 SENSITIVE Sensitive     IMIPENEM <=0.25 SENSITIVE Sensitive     TRIMETH/SULFA <=20 SENSITIVE Sensitive     AMPICILLIN/SULBACTAM 4 SENSITIVE Sensitive     PIP/TAZO <=4 SENSITIVE Sensitive     * KLEBSIELLA PNEUMONIAE  Blood Culture ID Panel (Reflexed)     Status: Abnormal   Collection Time: 11/20/21 11:32 PM  Result Value Ref Range Status   Enterococcus faecalis NOT DETECTED NOT DETECTED Final   Enterococcus Faecium NOT DETECTED NOT DETECTED Final   Listeria monocytogenes NOT DETECTED NOT DETECTED Final   Staphylococcus species NOT DETECTED NOT DETECTED Final    Staphylococcus aureus (BCID) NOT DETECTED NOT DETECTED Final   Staphylococcus epidermidis NOT DETECTED NOT DETECTED Final   Staphylococcus lugdunensis NOT DETECTED NOT DETECTED Final   Streptococcus species NOT DETECTED NOT DETECTED Final   Streptococcus agalactiae NOT DETECTED NOT DETECTED Final   Streptococcus pneumoniae NOT DETECTED NOT DETECTED Final   Streptococcus pyogenes NOT DETECTED NOT DETECTED Final   A.calcoaceticus-baumannii NOT DETECTED NOT DETECTED Final   Bacteroides fragilis NOT DETECTED NOT DETECTED Final   Enterobacterales DETECTED (A) NOT DETECTED Final    Comment: Enterobacterales represent a large order of gram negative bacteria, not a single organism. CRITICAL RESULT CALLED TO, READ BACK BY AND VERIFIED WITH: MORGAN HICKS PHARMD [id number] 11/21/21 HNM    Enterobacter cloacae complex NOT DETECTED NOT DETECTED Final   Escherichia coli NOT DETECTED NOT DETECTED Final   Klebsiella aerogenes NOT DETECTED NOT DETECTED Final   Klebsiella oxytoca NOT DETECTED NOT DETECTED Final   Klebsiella pneumoniae DETECTED (A) NOT DETECTED Final    Comment: CRITICAL RESULT CALLED TO, READ BACK BY AND VERIFIED WITH: MORGAN HICKS PHARMD [id number] 11/21/21 HNM    Proteus species NOT DETECTED NOT DETECTED Final   Salmonella species NOT DETECTED NOT DETECTED Final   Serratia marcescens NOT DETECTED NOT DETECTED Final   Haemophilus influenzae NOT DETECTED NOT DETECTED Final   Neisseria meningitidis NOT DETECTED NOT DETECTED Final   Pseudomonas aeruginosa NOT DETECTED NOT DETECTED Final   Stenotrophomonas maltophilia NOT DETECTED NOT DETECTED Final   Candida albicans NOT DETECTED NOT DETECTED Final   Candida auris NOT DETECTED NOT DETECTED Final   Candida glabrata NOT DETECTED NOT DETECTED Final   Candida krusei NOT DETECTED NOT DETECTED Final   Candida parapsilosis NOT DETECTED NOT DETECTED Final   Candida tropicalis NOT DETECTED NOT DETECTED Final   Cryptococcus neoformans/gattii NOT  DETECTED NOT DETECTED Final   CTX-M ESBL NOT DETECTED NOT DETECTED Final   Carbapenem resistance IMP NOT DETECTED NOT DETECTED Final   Carbapenem resistance KPC NOT DETECTED NOT DETECTED Final   Carbapenem resistance NDM NOT DETECTED NOT DETECTED Final   Carbapenem resist OXA 48 LIKE NOT DETECTED NOT DETECTED Final   Carbapenem resistance VIM NOT DETECTED NOT DETECTED Final    Comment: Performed at [facility], [address], [place], [place] [address]  Urine Culture     Status: Abnormal   Collection Time: 11/20/21 11:33 PM   Specimen: Urine, Random  Result Value Ref Range Status   Specimen Description   Final    URINE, RANDOM Performed at [facility], [address], [place], [place] [address]    Special Requests   Final    NONE Performed at [facility], [phone] [place], [place] [address]    Culture >=100,000 COLONIES/mL KLEBSIELLA PNEUMONIAE (A)  Final   Report Status 11/23/2021 FINAL  Final   Organism ID, Bacteria KLEBSIELLA PNEUMONIAE (A)  Final      Susceptibility   Klebsiella pneumoniae - MIC*    AMPICILLIN RESISTANT Resistant     CEFAZOLIN <=4 SENSITIVE Sensitive     CEFEPIME <=0.12 SENSITIVE Sensitive     CEFTRIAXONE <=0.25 SENSITIVE Sensitive     CIPROFLOXACIN <=0.25 SENSITIVE Sensitive     GENTAMICIN <=1 SENSITIVE Sensitive     IMIPENEM <=0.25 SENSITIVE Sensitive     NITROFURANTOIN 128 RESISTANT Resistant     TRIMETH/SULFA <=20 SENSITIVE Sensitive     AMPICILLIN/SULBACTAM 4 SENSITIVE Sensitive     PIP/TAZO <=4 SENSITIVE Sensitive     * >=100,000 COLONIES/mL KLEBSIELLA PNEUMONIAE         Radiology Studies: CT ABDOMEN PELVIS WO CONTRAST  Result Date: 11/22/2021 CLINICAL DATA:  Septic workup, suspect urosepsis, pyelonephritis. EXAM: CT ABDOMEN AND PELVIS WITHOUT IV CONTRAST (oral contrast only) TECHNIQUE: Multidetector CT imaging of the abdomen and pelvis was performed following the standard protocol without IV contrast.  COMPARISON:  CT with oral contrast only 07/04/2009. FINDINGS: Lower chest: There are trace pleural effusions. Posterior atelectasis in the lung bases without infiltrates. Mild cardiomegaly and small anterior pericardial effusion. There is unilateral left gynecomastia. Hepatobiliary: 18 cm in length slightly steatotic liver. No focal liver abnormality is seen without contrast and accounting for respiratory motion artifact and artifact from the patient's arms in the field. The gallbladder bile ducts are unremarkable. Pancreas: No mass is seen without contrast or ductal dilatation. Spleen: Unremarkable without contrast. Adrenals/Urinary Tract: Stable 1.6 cm left adrenal adenoma. The right adrenal is unremarkable. No focal abnormality seen of unenhanced renal cortex. There is increased bilateral perinephric stranding and moderate bilateral hydroureteronephrosis extending to the bladder. No urinary stone is seen but the bladder is markedly distended with the dome reaching as high as the level of L3-4. No focal bladder thickening is evident. Stomach/Bowel: The gastric wall, small bowel are unremarkable. The appendix contains contrast or scattered stones and is normal caliber. There is moderate stool retention in the colon. There are diverticula without evidence of a focal colitis or diverticulitis. Vascular/Lymphatic: Aortic atherosclerosis. No enlarged abdominal or pelvic lymph nodes. There patchy calcifications in the arteries. Right femoral approach double lumen catheter terminates in the IVC, L4-5 level. IVC filter in place. Reproductive: Mildly prominent prostate. Other: Mild generalized body wall anasarca is seen, mild mesenteric congestive features, with minimal ascites in the abdomen and pelvis. There is no free air, hemorrhage or abscess. Musculoskeletal: Again noted chronic left pelvic and proximal left femoral fracture fixation hardware, with extensive heterotopic bone formation about the left hip, with healed  left pubic rami fracture deformities and ankylosis across both SI joints. There are degenerative changes in the lumbar spine with bridging osteophytes at L3-4. There is no worrisome regional bone lesion. IMPRESSION: 1. Dilatation of the bladder with upstream bilateral moderate hydroureteronephrosis. No stones are seen. No focal bladder thickening. Mild prostatomegaly. 2. Body wall edema with mild mesenteric congestive features and minimal ascites. 3. Trace pleural effusions, small anterior pericardial fluid. 4. Mild cardiomegaly, with aortic atherosclerosis. 5. Constipation and diverticulosis. No bowel obstruction or inflammation. Normal caliber appendix with contrast or stones within. 6. Unilateral left gynecomastia. Electronically Signed   By: Telford Nab M.D.   On: 11/22/2021 22:39        Scheduled Meds:  aspirin  EC  81 mg Oral Daily   Chlorhexidine Gluconate Cloth  6 each Topical Q0600   ferrous sulfate  325 mg Oral Q breakfast   heparin  5,000 Units Subcutaneous Q8H   pregabalin  25 mg Oral Daily   Continuous Infusions:  azithromycin 500 mg (11/23/21 2354)    ceFAZolin (ANCEF) IV 2 g (11/24/21 0541)     LOS: 3 days     Cordelia Poche, MD [facility] 11/24/2021, 11:53 AM  If 7PM-7AM, please contact night-coverage [email or web address]

## 2021-11-25 LAB — CBC
HCT: 25.4 % — ABNORMAL LOW (ref 39.0–52.0)
Hemoglobin: 8.4 g/dL — ABNORMAL LOW (ref 13.0–17.0)
MCH: 27.2 pg (ref 26.0–34.0)
MCHC: 33.1 g/dL (ref 30.0–36.0)
MCV: 82.2 fL (ref 80.0–100.0)
Platelets: 241 10*3/uL (ref 150–400)
RBC: 3.09 MIL/uL — ABNORMAL LOW (ref 4.22–5.81)
RDW: 14.3 % (ref 11.5–15.5)
WBC: 7.1 10*3/uL (ref 4.0–10.5)
nRBC: 0 % (ref 0.0–0.2)

## 2021-11-25 LAB — BASIC METABOLIC PANEL
Anion gap: 13 (ref 5–15)
BUN: 47 mg/dL — ABNORMAL HIGH (ref 8–23)
CO2: 24 mmol/L (ref 22–32)
Calcium: 8.5 mg/dL — ABNORMAL LOW (ref 8.9–10.3)
Chloride: 103 mmol/L (ref 98–111)
Creatinine, Ser: 4.01 mg/dL — ABNORMAL HIGH (ref 0.61–1.24)
GFR, Estimated: 16 mL/min — ABNORMAL LOW (ref >60)
Glucose, Bld: 141 mg/dL — ABNORMAL HIGH (ref 70–99)
Potassium: 3.5 mmol/L (ref 3.5–5.1)
Sodium: 140 mmol/L (ref 135–145)

## 2021-11-25 MED ORDER — SENNOSIDES-DOCUSATE SODIUM 8.6-50 MG PO TABS
1.0000 | ORAL_TABLET | Freq: Two times a day (BID) | ORAL | Status: DC
  Administered 2021-11-25 – 2021-11-26 (×3): 1 via ORAL
  Filled 2021-11-25 (×3): qty 1

## 2021-11-25 MED ORDER — AMLODIPINE BESYLATE 10 MG PO TABS
10.0000 mg | ORAL_TABLET | Freq: Every day | ORAL | Status: DC
  Administered 2021-11-25 – 2021-11-26 (×2): 10 mg via ORAL
  Filled 2021-11-25 (×2): qty 1

## 2021-11-25 MED ORDER — ATORVASTATIN CALCIUM 20 MG PO TABS
40.0000 mg | ORAL_TABLET | Freq: Every day | ORAL | Status: DC
  Administered 2021-11-25: 40 mg via ORAL
  Filled 2021-11-25: qty 2

## 2021-11-25 MED ORDER — CYCLOBENZAPRINE HCL 10 MG PO TABS: 10.0000 mg | ORAL_TABLET | Freq: Three times a day (TID) | ORAL | Status: DC | PRN

## 2021-11-25 MED ORDER — SODIUM BICARBONATE 650 MG PO TABS
650.0000 mg | ORAL_TABLET | Freq: Two times a day (BID) | ORAL | Status: DC
  Administered 2021-11-25 – 2021-11-26 (×3): 650 mg via ORAL
  Filled 2021-11-25 (×3): qty 1

## 2021-11-25 MED ORDER — LUBIPROSTONE 24 MCG PO CAPS
24.0000 ug | ORAL_CAPSULE | Freq: Two times a day (BID) | ORAL | Status: DC
  Administered 2021-11-25 – 2021-11-26 (×2): 24 ug via ORAL
  Filled 2021-11-25 (×4): qty 1

## 2021-11-25 MED ORDER — PREGABALIN 75 MG PO CAPS
75.0000 mg | ORAL_CAPSULE | Freq: Two times a day (BID) | ORAL | Status: DC
  Administered 2021-11-25 – 2021-11-26 (×2): 75 mg via ORAL
  Filled 2021-11-25 (×2): qty 1

## 2021-11-25 MED ORDER — PRIMIDONE 50 MG PO TABS
50.0000 mg | ORAL_TABLET | Freq: Two times a day (BID) | ORAL | Status: DC
  Administered 2021-11-25 – 2021-11-26 (×3): 50 mg via ORAL
  Filled 2021-11-25 (×4): qty 1

## 2021-11-25 NOTE — Progress Notes (Signed)
[facility]  ROUNDING NOTE   Subjective:   Patient seen today on second floor Patient resting comfortably. Patient offers no new specific physical complaints   Objective:  Vital signs in last 24 hours:  Temp:  [98.9 F (37.2 C)-100 F (37.8 C)] 98.9 F (37.2 C) (01/01 0838) Pulse Rate:  [72-85] 73 (01/01 0838) Resp:  [16-18] 16 (01/01 0838) BP: (126-136)/(67-79) 136/77 (01/01 0838) SpO2:  [95 %-99 %] 98 % (01/01 0838)  Weight change:  Filed Weights   11/22/21 0500 11/22/21 1830 11/22/21 2045  Weight: 81.4 kg 83.4 kg 82.3 kg    Intake/Output: I/O last 3 completed shifts: In: 680 [P.O.:480; IV Piggyback:200] Out: 5500 [Urine:5500]   Intake/Output this shift:  No intake/output data recorded.  Physical Exam: General: NAD, seated in chair  Head: Normocephalic, atraumatic. Moist oral mucosal membranes  Eyes: Anicteric  Lungs:  Clear to auscultation  Heart: Regular rate and rhythm  Abdomen:  Soft, nontender  Extremities:  trace peripheral edema.  Neurologic: Nonfocal, moving all four extremities  Skin: No lesions  Access: Rt femoral temp cath placed on 11/21/21 now DC'd  GU-Foley catheter  Basic Metabolic Panel: Recent Labs  Lab 11/20/21 1224 11/21/21 0745 11/21/21 1610 11/22/21 0517 11/24/21 0429 11/24/21 0918 11/25/21 0622  NA 131* 135  --  133*  --  139 140  K 5.0 5.7*  --  4.6  --  3.5 3.5  CL 100 106  --  99  --  102 103  CO2 20* 16*  --  21*  --  26 24  GLUCOSE 161* 125*  --  113*  --  116* 141*  BUN 94* 105*  --  84*  --  43* 47*  CREATININE 6.76* 7.49*  --  6.43* 3.73* 3.66* 4.01*  CALCIUM 9.1 8.6*  --  8.3*  --  8.2* 8.5*  PHOS  --   --  4.3 4.4  --  3.8  --     Liver Function Tests: Recent Labs  Lab 11/20/21 1224 11/22/21 0517 11/24/21 0918  AST 60*  --   --   ALT 43  --   --   ALKPHOS 51  --   --   BILITOT 0.7  --   --   PROT 7.4  --   --   ALBUMIN 3.3* 2.4* 2.4*   No results for input(s): LIPASE, AMYLASE in the last  168 hours. No results for input(s): AMMONIA in the last 168 hours.  CBC: Recent Labs  Lab 11/20/21 1224 11/21/21 0745 11/22/21 0517 11/24/21 0918 11/25/21 0622  WBC 10.3 13.3* 8.8 6.9 7.1  NEUTROABS 8.0*  --   --   --   --   HGB 11.0* 9.6* 8.6* 8.6* 8.4*  HCT 34.0* 29.1* 25.3* 25.9* 25.4*  MCV 84.2 82.2 80.3 80.7 82.2  PLT 175 154 133* 185 241    Cardiac Enzymes: No results for input(s): CKTOTAL, CKMB, CKMBINDEX, TROPONINI in the last 168 hours.  BNP: Invalid input(s): POCBNP  CBG: No results for input(s): GLUCAP in the last 168 hours.  Microbiology: Results for orders placed or performed during the hospital encounter of 11/20/21  Resp Panel by RT-PCR (Flu A&B, Covid) Nasopharyngeal Swab     Status: None   Collection Time: 11/20/21  8:48 PM   Specimen: Nasopharyngeal Swab; Nasopharyngeal(NP) swabs in vial transport medium  Result Value Ref Range Status   SARS Coronavirus 2 by RT PCR NEGATIVE NEGATIVE Final    Comment: (NOTE) SARS-CoV-2  target nucleic acids are NOT DETECTED.  The SARS-CoV-2 RNA is generally detectable in upper respiratory specimens during the acute phase of infection. The lowest concentration of SARS-CoV-2 viral copies this assay can detect is 138 copies/mL. A negative result does not preclude SARS-Cov-2 infection and should not be used as the sole basis for treatment or other patient management decisions. A negative result may occur with  improper specimen collection/handling, submission of specimen other than nasopharyngeal swab, presence of viral mutation(s) within the areas targeted by this assay, and inadequate number of viral copies(<138 copies/mL). A negative result must be combined with clinical observations, patient history, and epidemiological information. The expected result is Negative.  Fact Sheet for Patients:  EntrepreneurPulse.com.au  Fact Sheet for Healthcare Providers:   IncredibleEmployment.be  This test is no t yet approved or cleared by the [place] FDA and  has been authorized for detection and/or diagnosis of SARS-CoV-2 by FDA under an Emergency Use Authorization (EUA). This EUA will remain  in effect (meaning this test can be used) for the duration of the COVID-19 declaration under Section 564(b)(1) of the Act, 21 U.S.C.section 360bbb-3(b)(1), unless the authorization is terminated  or revoked sooner.       Influenza A by PCR NEGATIVE NEGATIVE Final   Influenza B by PCR NEGATIVE NEGATIVE Final    Comment: (NOTE) The Xpert Xpress SARS-CoV-2/FLU/RSV plus assay is intended as an aid in the diagnosis of influenza from Nasopharyngeal swab specimens and should not be used as a sole basis for treatment. Nasal washings and aspirates are unacceptable for Xpert Xpress SARS-CoV-2/FLU/RSV testing.  Fact Sheet for Patients: EntrepreneurPulse.com.au  Fact Sheet for Healthcare Providers: IncredibleEmployment.be  This test is not yet approved or cleared by the [place] FDA and has been authorized for detection and/or diagnosis of SARS-CoV-2 by FDA under an Emergency Use Authorization (EUA). This EUA will remain in effect (meaning this test can be used) for the duration of the COVID-19 declaration under Section 564(b)(1) of the Act, 21 U.S.C. section 360bbb-3(b)(1), unless the authorization is terminated or revoked.  Performed at [facility], [address] [place], [place] [address]   Blood culture (routine x 2)     Status: Abnormal   Collection Time: 11/20/21 11:32 PM   Specimen: BLOOD  Result Value Ref Range Status   Specimen Description   Final    BLOOD RIGHT FOREARM Performed at [facility], [address] [place], [place] [address]    Special Requests   Final    BOTTLES DRAWN AEROBIC AND ANAEROBIC Blood Culture results may not be optimal due to an  excessive volume of blood received in culture bottles Performed at [facility], [address] [place], [place] [address]    Culture  Setup Time   Final    GRAM NEGATIVE RODS IN BOTH AEROBIC AND ANAEROBIC BOTTLES CRITICAL VALUE NOTED.  VALUE IS CONSISTENT WITH PREVIOUSLY REPORTED AND CALLED VALUE. Performed at [facility], [address] [place], [place] [address]    Culture (A)  Final    KLEBSIELLA PNEUMONIAE SUSCEPTIBILITIES PERFORMED ON PREVIOUS CULTURE WITHIN THE LAST 5 DAYS. Performed at [facility], [place] [address] [place], [place] [address]    Report Status 11/23/2021 FINAL  Final  Blood culture (routine x 2)     Status: Abnormal   Collection Time: 11/20/21 11:32 PM   Specimen: BLOOD  Result Value Ref Range Status   Specimen Description   Final    BLOOD RIGHT  ASSIST CONTROL Performed at [facility], [address] [place], [place] [address]    Special Requests   Final    BOTTLES DRAWN AEROBIC AND ANAEROBIC Blood Culture adequate volume Performed at [facility], [address] [place], [place] [address]    Culture  Setup Time   Final    GRAM NEGATIVE RODS IN BOTH AEROBIC AND ANAEROBIC BOTTLES Organism ID to follow CRITICAL RESULT CALLED TO, READ BACK BY AND VERIFIED WITHArdeen Garland PHARMD 1039 11/21/21 HNM Performed at [facility], [address] [place],  [address]    Culture KLEBSIELLA PNEUMONIAE (A)  Final   Report Status 11/23/2021 FINAL  Final   Organism ID, Bacteria KLEBSIELLA PNEUMONIAE  Final      Susceptibility   Klebsiella pneumoniae - MIC*    AMPICILLIN RESISTANT Resistant     CEFAZOLIN <=4 SENSITIVE Sensitive     CEFEPIME <=0.12 SENSITIVE Sensitive     CEFTAZIDIME <=1 SENSITIVE Sensitive     CEFTRIAXONE <=0.25 SENSITIVE Sensitive     CIPROFLOXACIN <=0.25 SENSITIVE Sensitive     GENTAMICIN <=1 SENSITIVE Sensitive     IMIPENEM <=0.25 SENSITIVE Sensitive     TRIMETH/SULFA <=20  SENSITIVE Sensitive     AMPICILLIN/SULBACTAM 4 SENSITIVE Sensitive     PIP/TAZO <=4 SENSITIVE Sensitive     * KLEBSIELLA PNEUMONIAE  Blood Culture ID Panel (Reflexed)     Status: Abnormal   Collection Time: 11/20/21 11:32 PM  Result Value Ref Range Status   Enterococcus faecalis NOT DETECTED NOT DETECTED Final   Enterococcus Faecium NOT DETECTED NOT DETECTED Final   Listeria monocytogenes NOT DETECTED NOT DETECTED Final   Staphylococcus species NOT DETECTED NOT DETECTED Final   Staphylococcus aureus (BCID) NOT DETECTED NOT DETECTED Final   Staphylococcus epidermidis NOT DETECTED NOT DETECTED Final   Staphylococcus lugdunensis NOT DETECTED NOT DETECTED Final   Streptococcus species NOT DETECTED NOT DETECTED Final   Streptococcus agalactiae NOT DETECTED NOT DETECTED Final   Streptococcus pneumoniae NOT DETECTED NOT DETECTED Final   Streptococcus pyogenes NOT DETECTED NOT DETECTED Final   A.calcoaceticus-baumannii NOT DETECTED NOT DETECTED Final   Bacteroides fragilis NOT DETECTED NOT DETECTED Final   Enterobacterales DETECTED (A) NOT DETECTED Final    Comment: Enterobacterales represent a large order of gram negative bacteria, not a single organism. CRITICAL RESULT CALLED TO, READ BACK BY AND VERIFIED WITH: MORGAN HICKS PHARMD [id number] 11/21/21 HNM    Enterobacter cloacae complex NOT DETECTED NOT DETECTED Final   Escherichia coli NOT DETECTED NOT DETECTED Final   Klebsiella aerogenes NOT DETECTED NOT DETECTED Final   Klebsiella oxytoca NOT DETECTED NOT DETECTED Final   Klebsiella pneumoniae DETECTED (A) NOT DETECTED Final    Comment: CRITICAL RESULT CALLED TO, READ BACK BY AND VERIFIED WITH: MORGAN HICKS PHARMD 1039 11/21/21 HNM    Proteus species NOT DETECTED NOT DETECTED Final   Salmonella species NOT DETECTED NOT DETECTED Final   Serratia marcescens NOT DETECTED NOT DETECTED Final   Haemophilus influenzae NOT DETECTED NOT DETECTED Final   Neisseria meningitidis NOT DETECTED NOT  DETECTED Final   Pseudomonas aeruginosa NOT DETECTED NOT DETECTED Final   Stenotrophomonas maltophilia NOT DETECTED NOT DETECTED Final   Candida albicans NOT DETECTED NOT DETECTED Final   Candida auris NOT DETECTED NOT DETECTED Final   Candida glabrata NOT DETECTED NOT DETECTED Final   Candida krusei NOT DETECTED NOT DETECTED Final   Candida parapsilosis NOT DETECTED NOT DETECTED Final   Candida tropicalis NOT DETECTED NOT DETECTED  Final   Cryptococcus neoformans/gattii NOT DETECTED NOT DETECTED Final   CTX-M ESBL NOT DETECTED NOT DETECTED Final   Carbapenem resistance IMP NOT DETECTED NOT DETECTED Final   Carbapenem resistance KPC NOT DETECTED NOT DETECTED Final   Carbapenem resistance NDM NOT DETECTED NOT DETECTED Final   Carbapenem resist OXA 48 LIKE NOT DETECTED NOT DETECTED Final   Carbapenem resistance VIM NOT DETECTED NOT DETECTED Final    Comment: Performed at [facility], [address], [place], [place] [address]  Urine Culture     Status: Abnormal   Collection Time: 11/20/21 11:33 PM   Specimen: Urine, Random  Result Value Ref Range Status   Specimen Description   Final    URINE, RANDOM Performed at [facility], [address], [place], [place] [address]    Special Requests   Final    NONE Performed at [facility], [address], [place], [place] [address]    Culture >=100,000 COLONIES/mL KLEBSIELLA PNEUMONIAE (A)  Final   Report Status 11/23/2021 FINAL  Final   Organism ID, Bacteria KLEBSIELLA PNEUMONIAE (A)  Final      Susceptibility   Klebsiella pneumoniae - MIC*    AMPICILLIN RESISTANT Resistant     CEFAZOLIN <=4 SENSITIVE Sensitive     CEFEPIME <=0.12 SENSITIVE Sensitive     CEFTRIAXONE <=0.25 SENSITIVE Sensitive     CIPROFLOXACIN <=0.25 SENSITIVE Sensitive     GENTAMICIN <=1 SENSITIVE Sensitive     IMIPENEM <=0.25 SENSITIVE Sensitive     NITROFURANTOIN 128 RESISTANT Resistant     TRIMETH/SULFA <=20 SENSITIVE Sensitive      AMPICILLIN/SULBACTAM 4 SENSITIVE Sensitive     PIP/TAZO <=4 SENSITIVE Sensitive     * >=100,000 COLONIES/mL KLEBSIELLA PNEUMONIAE    Coagulation Studies: No results for input(s): LABPROT, INR in the last 72 hours.   Urinalysis: No results for input(s): COLORURINE, LABSPEC, PHURINE, GLUCOSEU, HGBUR, BILIRUBINUR, KETONESUR, PROTEINUR, UROBILINOGEN, NITRITE, LEUKOCYTESUR in the last 72 hours.  Invalid input(s): APPERANCEUR    Imaging: No results found.   Medications:     ceFAZolin (ANCEF) IV 2 g (11/25/21 0527)    aspirin EC  81 mg Oral Daily   Chlorhexidine Gluconate Cloth  6 each Topical Q0600   ferrous sulfate  325 mg Oral Q breakfast   heparin  5,000 Units Subcutaneous Q8H   polyethylene glycol  17 g Oral Daily   pregabalin  25 mg Oral Daily   acetaminophen **OR** acetaminophen, ondansetron **OR** ondansetron (ZOFRAN) IV, oxyCODONE  Assessment/ Plan:  [name] is a 61 y.o.  male SNF resident with diabetes mellitus type II, paraplegia, GERD, blindness, glaucoma, hypertension and hyperlipidemia who was admitted to [facility] on 11/20/2021 for UTI (urinary tract infection) [N39.0] AKI (acute kidney injury) ([place]) [N17.9] Urinary tract infection without hematuria, site unspecified [N39.0] Pneumonia due to infectious organism, unspecified laterality, unspecified part of lung [J18.9]   Acute kidney injury on chronic kidney disease stage IV versus progression to Chronic kidney disease stage V: with maturing AVF. Placed on 12/16 Dr. Delana Meyer.  Patient was admitted with hyperkalemia, metabolic acidosis and uremia. Patient required renal replacement therapy Patient had a temporary catheter placed and first dialysis treatment was performed on November 21, 2021 Patient last dialysis treatment was performed on November 23, 2021 Patient temporary catheter has since been removed Patient's radiological work-up showed-CT abd/pelvis shows hydronephrosis and bladder scan showed fluid  retention.   Patient had Foley catheter placed No need for renal replacement therapy We will  continue to evaluate and determine continued need for dialysis. NO need for HD today   Lab Results  Component Value Date   CREATININE 4.01 (H) 11/25/2021   CREATININE 3.66 (H) 11/24/2021   CREATININE 3.73 (H) 11/24/2021    Intake/Output Summary (Last 24 hours) at 11/25/2021 1056 Last data filed at 11/25/2021 0508 Gross per 24 hour  Intake 680 ml  Output 5500 ml  Net -4820 ml   Anemia of chronic kidney disease:  Hemoglobin is not at goal Patient has iron deficiency anemia as patient saturation was low at 7% on December 28 With the patient's active infection we will hold off on giving IV iron We will give patient p.o. iron for now   Hypertension:   Hypertension Patient blood pressure is at goal   Urinary tract infection:  blood cultures positive with Klebsiella species. Continue empiric antibiotics with azithromycin and ceftriaxone  Plan I did discuss with the patient about his kidney related issues Dictated patient with possible need for renal placement therapy No need for acute renal replacement therapy today       LOS: 4 [name] s Aily Tzeng 1/1/202310:56 AM

## 2021-11-25 NOTE — TOC CM/SW Note (Signed)
°  Transition of Care [facility]) Screening Note   Patient Details  Name: [name] Date of Birth: 03/25/1961   Transition of Care [facility]) CM/SW Contact:    Izola Price, RN Phone Number: 11/25/2021, 4:22 PM  Transition of Care Department [facility]) has reviewed patient and no immediate TOC needs have been identified at this time, while undergoing ongoing medical treatment. ,  We will continue to monitor patient advancement through interdisciplinary progression rounds.   If new patient transition needs arise, please place a TOC consult. (From [facility] and will need TOC assistance if returns)

## 2021-11-25 NOTE — Progress Notes (Signed)
[name]    MR#:  [id number]  DATE OF BIRTH:  June 03, 1961  SUBJECTIVE:   resting quietly. No family at bedside. No new complaints. REVIEW OF SYSTEMS:   Review of Systems  Constitutional:  Negative for chills, fever and weight loss.  HENT:  Negative for ear discharge, ear pain and nosebleeds.   Eyes:  Negative for blurred vision, pain and discharge.  Respiratory:  Negative for sputum production, shortness of breath, wheezing and stridor.   Cardiovascular:  Negative for chest pain, palpitations, orthopnea and PND.  Gastrointestinal:  Negative for abdominal pain, diarrhea, nausea and vomiting.  Genitourinary:  Negative for frequency and urgency.  Musculoskeletal:  Negative for back pain and joint pain.  Neurological:  Positive for weakness. Negative for sensory change, speech change and focal weakness.  Psychiatric/Behavioral:  Negative for depression and hallucinations. The patient is not nervous/anxious.   Tolerating Diet: Tolerating PT:   DRUG ALLERGIES:  No Known Allergies  VITALS:  Blood pressure 132/68, pulse 80, temperature 99.2 F (37.3 C), temperature source Oral, resp. rate 16, height 6\' 1"  (1.854 m), weight 82.3 kg, SpO2 98 %.  PHYSICAL EXAMINATION:   Physical Exam  GENERAL:  61 y.o.-year-old patient lying in the bed with no acute distress. Chronically ill HEENT: Head atraumatic, normocephalic. Oropharynx and nasopharynx clear.  LUNGS: decreased breath sounds bilaterally, no wheezing, rales, rhonchi. No use of accessory muscles of respiration.  CARDIOVASCULAR: S1, S2 normal. No murmurs, rubs, or gallops.  ABDOMEN: Soft, nontender, nondistended. FOLEY+ EXTREMITIES: No cyanosis, clubbing or edema b/l.    NEUROLOGIC: nonfocal PSYCHIATRIC:  patient is alert  SKIN: No obvious rash, lesion, or ulcer.   LABORATORY PANEL:  CBC Recent Labs  Lab 11/25/21 0622  WBC 7.1  HGB 8.4*  HCT 25.4*   PLT 241    Chemistries  Recent Labs  Lab 11/20/21 1224 11/21/21 0745 11/25/21 0622  NA 131*   < > 140  K 5.0   < > 3.5  CL 100   < > 103  CO2 20*   < > 24  GLUCOSE 161*   < > 141*  BUN 94*   < > 47*  CREATININE 6.76*   < > 4.01*  CALCIUM 9.1   < > 8.5*  AST 60*  --   --   ALT 43  --   --   ALKPHOS 51  --   --   BILITOT 0.7  --   --    < > = values in this interval not displayed.   Cardiac Enzymes No results for input(s): TROPONINI in the last 168 hours. RADIOLOGY:  No results found. ASSESSMENT AND PLAN:  [name] is a 61 y.o. male with medical history significant for DM, HTN, CKD stage IV s/p left brachial AV fistula placement on 11/09/2021 in preparation for dialysis. Patient presented secondary to altered mental status and fever, meeting severe sepsis criteria with evidence of UTI.  Severe sepsis present on admission/renal failure/metabolic encephalopathy -- suspected UTI -- blood culture grew since Klebsiella pneumonia -- urine culture Klebsiella -- currently on IV Ancef  acute on chronic kidney disease stage IV -- most recent creatinine 3.07 -- patient came in with creatinine of 6.7--- received one treatment with hemodialysis-- patient making urine -- femoral dialysis catheter removed on 12/30 -- patient has left brachial AV fistula done on 11/09/2021 -- nephrology-- recommends monitor renal recovery -- good urine output after  one dialysis rx uop 5500 cc  UTI -- urine culture Klebsiella -- CT abdomen pelvis without abscess -- continue IV cefazolin  acute metabolic encephalopathy secondary to acute illness -- improved mentation -- CT head negative for acute process  acute urinary retention -- patient had 1800 CT of bladder scan -- Foley catheter placed -- will DC Foley tomorrow  Primary hypertension- (present on admission) Patient is on amlodipine, hydralazine and losartan as an outpatient. Hypotensive on admission. Antihypertensives held. Blood  pressure stable.  Procedures: HD x1 Family communication :none today Consults :Nephrology, vascular CODE STATUS: FULL DVT Prophylaxis : heparin Level of care: Med-Surg Status is: Inpatient  Remains inpatient appropriate because: sepsis        TOTAL TIME TAKING CARE OF THIS PATIENT: 25 minutes.  >50% time spent on counselling and coordination of care  Note: This dictation was prepared with Dragon dictation along with smaller phrase technology. Any transcriptional errors that result from this process are unintentional.  Fritzi Mandes M.D    [facility]   CC: Primary care physician; Elizabeth Palau, MD (Inactive) Patient ID: [name], male   DOB: Dec 04, 1960, 61 y.o.   MRN: [id number]

## 2021-11-26 DIAGNOSIS — R338 Other retention of urine: Secondary | ICD-10-CM

## 2021-11-26 DIAGNOSIS — N390 Urinary tract infection, site not specified: Secondary | ICD-10-CM

## 2021-11-26 DIAGNOSIS — B961 Klebsiella pneumoniae [K. pneumoniae] as the cause of diseases classified elsewhere: Secondary | ICD-10-CM

## 2021-11-26 DIAGNOSIS — R7881 Bacteremia: Secondary | ICD-10-CM

## 2021-11-26 MED ORDER — LOSARTAN POTASSIUM 25 MG PO TABS: 100.0000 mg | ORAL_TABLET | Freq: Every day | ORAL | 0 refills | Status: DC

## 2021-11-26 MED ORDER — OXYCODONE HCL 10 MG PO TABS: 5.0000 mg | ORAL_TABLET | ORAL | 0 refills | Status: DC | PRN

## 2021-11-26 MED ORDER — CIPROFLOXACIN HCL 500 MG PO TABS
500.0000 mg | ORAL_TABLET | Freq: Every day | ORAL | Status: DC
  Administered 2021-11-26: 500 mg via ORAL
  Filled 2021-11-26: qty 1

## 2021-11-26 MED ORDER — CIPROFLOXACIN HCL 500 MG PO TABS: 500.0000 mg | ORAL_TABLET | Freq: Every day | ORAL | 0 refills | Status: AC

## 2021-11-26 MED ORDER — TAMSULOSIN HCL 0.4 MG PO CAPS: 0.4000 mg | ORAL_CAPSULE | Freq: Every day | ORAL | 0 refills | Status: DC

## 2021-11-26 MED ORDER — OXYCODONE HCL 10 MG PO TABS: 10.0000 mg | ORAL_TABLET | ORAL | 0 refills | Status: DC | PRN

## 2021-11-26 MED ORDER — FERROUS SULFATE 325 (65 FE) MG PO TABS: 325.0000 mg | ORAL_TABLET | Freq: Every day | ORAL | 3 refills | Status: DC

## 2021-11-26 MED ORDER — TAMSULOSIN HCL 0.4 MG PO CAPS
0.4000 mg | ORAL_CAPSULE | Freq: Every day | ORAL | Status: DC
  Administered 2021-11-26: 0.4 mg via ORAL
  Filled 2021-11-26: qty 1

## 2021-11-26 MED ORDER — PREGABALIN 25 MG PO CAPS: 25.0000 mg | ORAL_CAPSULE | Freq: Every day | ORAL | 0 refills | Status: DC

## 2021-11-26 NOTE — Progress Notes (Signed)
Pt foley DC at 05:00 this AM 11/26/2021. 1500 mls urine emptied.
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
## 2021-11-26 NOTE — Discharge Instructions (Signed)
Foley catheter care per protocol  patient will need to follow-up with Dr. Aaron Edelman sninsky at [facility] for voiding trial in 10 to 14 days.

## 2021-11-26 NOTE — Plan of Care (Signed)
Patient D/C to [facility] with EMS. PIV discontinued. Foley is in place with urology follow up.   Problem: Education: Goal: Knowledge of General Education information will improve Description: Including pain rating scale, medication(s)/side effects and non-pharmacologic comfort measures Outcome: Adequate for Discharge   Problem: Health Behavior/Discharge Planning: Goal: Ability to manage health-related needs will improve Outcome: Adequate for Discharge   Problem: Clinical Measurements: Goal: Ability to maintain clinical measurements within normal limits will improve Outcome: Adequate for Discharge Goal: Will remain free from infection Outcome: Adequate for Discharge Goal: Diagnostic test results will improve Outcome: Adequate for Discharge Goal: Respiratory complications will improve Outcome: Adequate for Discharge Goal: Cardiovascular complication will be avoided Outcome: Adequate for Discharge   Problem: Activity: Goal: Risk for activity intolerance will decrease Outcome: Adequate for Discharge   Problem: Nutrition: Goal: Adequate nutrition will be maintained Outcome: Adequate for Discharge   Problem: Coping: Goal: Level of anxiety will decrease Outcome: Adequate for Discharge   Problem: Elimination: Goal: Will not experience complications related to bowel motility Outcome: Adequate for Discharge Goal: Will not experience complications related to urinary retention Outcome: Adequate for Discharge   Problem: Pain Managment: Goal: General experience of comfort will improve Outcome: Adequate for Discharge   Problem: Safety: Goal: Ability to remain free from injury will improve Outcome: Adequate for Discharge   Problem: Skin Integrity: Goal: Risk for impaired skin integrity will decrease Outcome: Adequate for Discharge

## 2021-11-26 NOTE — NC FL2 (Signed)
[place] LEVEL OF CARE SCREENING TOOL     IDENTIFICATION  Patient Name: [name] Birthdate: July 22, 1961 Sex: male Admission Date (Current Location): 11/20/2021  [place] and [place] Number:  Engineering geologist and Address:  [facility], [address], [place], [place] [address]      Provider Number: [id number]  Attending Physician Name and Address:  Fritzi Mandes, MD  Relative Name and Phone Number:       Current Level of Care: Hospital Recommended Level of Care: [place] Prior Approval Number:    Date Approved/Denied:   PASRR Number:    Discharge Plan: SNF    Current Diagnoses: Patient Active Problem List   Diagnosis Date Noted   Acute urinary retention 11/23/2021   History of hepatitis B 11/22/2021   History of hepatitis C 11/22/2021   Bacteremia due to Klebsiella pneumoniae 11/22/2021   UTI (urinary tract infection) 11/21/2021   Acute kidney injury superimposed on CKD IV ([place]) 11/21/2021   Severe sepsis ([place]) 11/21/2021   Pneumonia 11/21/2021   Diabetes ([place]) 10/22/2021   Allergic rhinitis 10/22/2021   Generalized anxiety disorder 10/22/2021   GERD (gastroesophageal reflux disease) 10/22/2021   Chronic hepatitis ([place]) 10/22/2021   HL (hearing loss) 10/22/2021   Primary hypertension 10/22/2021   Iron deficiency anemia 10/22/2021   Hyperlipidemia 10/22/2021   Ischemic heart disease 10/22/2021   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[place]) 10/22/2021   Chronic renal disease, stage IV ([place]) 10/22/2021   Difficulty walking 12/10/2020   Tremor 11/04/2018   Left leg pain 08/29/2015   Cerumen impaction 05/31/2013   Hearing loss of both ears 05/31/2013   Chronic osteomyelitis of lower leg ([place]) 07/23/2012    Orientation RESPIRATION BLADDER Height & Weight     Self, Time, Situation, Place  Normal Incontinent Weight:  (unable to weigh d/t safety concerns pt cant stand and chair has no weigh option) Height:  6\' 1"  (185.4 cm)   BEHAVIORAL SYMPTOMS/MOOD NEUROLOGICAL BOWEL NUTRITION STATUS      Continent Diet  AMBULATORY STATUS COMMUNICATION OF NEEDS Skin   Extensive Assist Verbally  (closed incision on left arm.open wound on right buttocks, foam dressing)                       Personal Care Assistance Level of Assistance  Bathing, Feeding, Dressing Bathing Assistance: Maximum assistance Feeding assistance: Independent Dressing Assistance: Maximum assistance     Functional Limitations Info  Sight, Hearing, Speech Sight Info: Adequate Hearing Info: Adequate Speech Info: Adequate    SPECIAL CARE FACTORS FREQUENCY  PT (By licensed PT), OT (By licensed OT)     PT Frequency: 5x OT Frequency: 5x            Contractures Contractures Info: Not present    Additional Factors Info  Code Status, Allergies Code Status Info: FUll Code Allergies Info: no known allergies           Current Medications (11/26/2021):  This is the current hospital active medication list Current Facility-Administered Medications  Medication Dose Route Frequency Provider Last Rate Last Admin   acetaminophen (TYLENOL) tablet 650 mg  650 mg Oral Q6H PRN Athena Masse, MD   650 mg at 11/22/21 2147   Or   acetaminophen (TYLENOL) suppository 650 mg  650 mg Rectal Q6H PRN Athena Masse, MD       amLODipine (NORVASC) tablet 10 mg  10 mg Oral Daily Fritzi Mandes, MD   10 mg at  11/26/21 0938   aspirin EC tablet 81 mg  81 mg Oral Daily Judd Gaudier V, MD   81 mg at 11/26/21 0935   atorvastatin (LIPITOR) tablet 40 mg  40 mg Oral QHS Fritzi Mandes, MD   40 mg at 11/25/21 2157   ceFAZolin (ANCEF) IVPB 2g/100 mL premix  2 g Intravenous Q12H Rauer, Forde Dandy, RPH 200 mL/hr at 11/26/21 0537 2 g at 11/26/21 0537   Chlorhexidine Gluconate Cloth 2 % PADS 6 each  6 each Topical Q0600 Kolluru, Sarath, MD   6 each at 11/26/21 1050   cyclobenzaprine (FLEXERIL) tablet 10 mg  10 mg Oral Q8H PRN Fritzi Mandes, MD       ferrous sulfate tablet 325 mg   325 mg Oral Q breakfast Bhutani, Manpreet S, MD   325 mg at 11/26/21 0935   heparin injection 5,000 Units  5,000 Units Subcutaneous Q8H Athena Masse, MD   5,000 Units at 11/26/21 0532   lubiprostone (AMITIZA) capsule 24 mcg  24 mcg Oral BID WC Fritzi Mandes, MD   24 mcg at 11/26/21 0938   ondansetron (ZOFRAN) tablet 4 mg  4 mg Oral Q6H PRN Athena Masse, MD       Or   ondansetron [facility]) injection 4 mg  4 mg Intravenous Q6H PRN Athena Masse, MD       oxyCODONE (Oxy IR/ROXICODONE) immediate release tablet 5-10 mg  5-10 mg Oral Q6H PRN Mariel Aloe, MD       polyethylene glycol (MIRALAX / GLYCOLAX) packet 17 g  17 g Oral Daily Mariel Aloe, MD   17 g at 11/26/21 0939   pregabalin (LYRICA) capsule 25 mg  25 mg Oral Daily Mariel Aloe, MD   25 mg at 11/26/21 0939   pregabalin (LYRICA) capsule 75 mg  75 mg Oral BID Fritzi Mandes, MD   75 mg at 11/26/21 0539   primidone (MYSOLINE) tablet 50 mg  50 mg Oral BID Fritzi Mandes, MD   50 mg at 11/26/21 0935   senna-docusate (Senokot-S) tablet 1 tablet  1 tablet Oral BID Fritzi Mandes, MD   1 tablet at 11/26/21 7673   sodium bicarbonate tablet 650 mg  650 mg Oral BID Fritzi Mandes, MD   650 mg at 11/26/21 4193     Discharge Medications: Please see discharge summary for a list of discharge medications.  Relevant Imaging Results:  Relevant Lab Results:   Additional Information SSN:[id number]  Gerrianne Scale Zakir Henner, LCSW

## 2021-11-26 NOTE — TOC Transition Note (Signed)
Transition of Care [facility]) - CM/SW Discharge Note   Patient Details  Name: [name] MRN: [id number] Date of Birth: 1961-02-23  [facility] CM/SW Contact:  Eileen Stanford, LCSW Phone Number: 11/26/2021, 2:58 PM   Clinical Narrative:   Clinical Social Worker facilitated patient discharge including contacting patient family and facility to confirm patient discharge plans.  Clinical information faxed to facility and family agreeable with plan.  CSW arranged ambulance transport via [facility] to [facility] (room 210a) .  RN to call [phone] for report prior to discharge.      Final next level of care: Skilled Nursing Facility Barriers to Discharge: No Barriers Identified   Patient Goals and CMS Choice        Discharge Placement              Patient chooses bed at:  ([facility]) Patient to be transferred to facility by: [facility]   Patient and family notified of of transfer: 11/26/21  Discharge Plan and Services                                     Social Determinants of Health (SDOH) Interventions     Readmission Risk Interventions No flowsheet data found.

## 2021-11-26 NOTE — Progress Notes (Signed)
[facility]  ROUNDING NOTE   Subjective:   Patient doing fair. States he had a good breakfast Denies shortness of breath No significant leg edema   Objective:  Vital signs in last 24 hours:  Temp:  [97.7 F (36.5 C)-99.7 F (37.6 C)] 98.6 F (37 C) (01/02 0812) Pulse Rate:  [61-80] 61 (01/02 0812) Resp:  [16-18] 18 (01/02 0812) BP: (119-132)/(64-77) 122/73 (01/02 0812) SpO2:  [95 %-97 %] 97 % (01/02 0812)  Weight change:  Filed Weights   11/22/21 0500 11/22/21 1830 11/22/21 2045  Weight: 81.4 kg 83.4 kg 82.3 kg    Intake/Output: I/O last 3 completed shifts: In: -  Out: 6800 [Urine:6800]   Intake/Output this shift:  Total I/O In: 360 [P.O.:360] Out: -   Physical Exam: General: NAD, laying in the bed  Head: Normocephalic, atraumatic. Moist oral mucosal membranes  Eyes: Anicteric  Lungs:  Clear to auscultation  Heart: Regular rate and rhythm  Abdomen:  Soft, nontender  Extremities: No significant peripheral edema.  Neurologic: Alert, able to answer questions appropriately  Access: Left upper arm developing access  GU-external catheter  Basic Metabolic Panel: Recent Labs  Lab 11/20/21 1224 11/21/21 0745 11/21/21 1610 11/22/21 0517 11/24/21 0429 11/24/21 0918 11/25/21 0622  NA 131* 135  --  133*  --  139 140  K 5.0 5.7*  --  4.6  --  3.5 3.5  CL 100 106  --  99  --  102 103  CO2 20* 16*  --  21*  --  26 24  GLUCOSE 161* 125*  --  113*  --  116* 141*  BUN 94* 105*  --  84*  --  43* 47*  CREATININE 6.76* 7.49*  --  6.43* 3.73* 3.66* 4.01*  CALCIUM 9.1 8.6*  --  8.3*  --  8.2* 8.5*  PHOS  --   --  4.3 4.4  --  3.8  --      Liver Function Tests: Recent Labs  Lab 11/20/21 1224 11/22/21 0517 11/24/21 0918  AST 60*  --   --   ALT 43  --   --   ALKPHOS 51  --   --   BILITOT 0.7  --   --   PROT 7.4  --   --   ALBUMIN 3.3* 2.4* 2.4*    No results for input(s): LIPASE, AMYLASE in the last 168 hours. No results for input(s): AMMONIA  in the last 168 hours.  CBC: Recent Labs  Lab 11/20/21 1224 11/21/21 0745 11/22/21 0517 11/24/21 0918 11/25/21 0622  WBC 10.3 13.3* 8.8 6.9 7.1  NEUTROABS 8.0*  --   --   --   --   HGB 11.0* 9.6* 8.6* 8.6* 8.4*  HCT 34.0* 29.1* 25.3* 25.9* 25.4*  MCV 84.2 82.2 80.3 80.7 82.2  PLT 175 154 133* 185 241     Cardiac Enzymes: No results for input(s): CKTOTAL, CKMB, CKMBINDEX, TROPONINI in the last 168 hours.  BNP: Invalid input(s): POCBNP  CBG: No results for input(s): GLUCAP in the last 168 hours.  Microbiology: Results for orders placed or performed during the hospital encounter of 11/20/21  Resp Panel by RT-PCR (Flu A&B, Covid) Nasopharyngeal Swab     Status: None   Collection Time: 11/20/21  8:48 PM   Specimen: Nasopharyngeal Swab; Nasopharyngeal(NP) swabs in vial transport medium  Result Value Ref Range Status   SARS Coronavirus 2 by RT PCR NEGATIVE NEGATIVE Final    Comment: (NOTE) SARS-CoV-2  target nucleic acids are NOT DETECTED.  The SARS-CoV-2 RNA is generally detectable in upper respiratory specimens during the acute phase of infection. The lowest concentration of SARS-CoV-2 viral copies this assay can detect is 138 copies/mL. A negative result does not preclude SARS-Cov-2 infection and should not be used as the sole basis for treatment or other patient management decisions. A negative result may occur with  improper specimen collection/handling, submission of specimen other than nasopharyngeal swab, presence of viral mutation(s) within the areas targeted by this assay, and inadequate number of viral copies(<138 copies/mL). A negative result must be combined with clinical observations, patient history, and epidemiological information. The expected result is Negative.  Fact Sheet for Patients:  EntrepreneurPulse.com.au  Fact Sheet for Healthcare Providers:  IncredibleEmployment.be  This test is no t yet approved or  cleared by the [place] FDA and  has been authorized for detection and/or diagnosis of SARS-CoV-2 by FDA under an Emergency Use Authorization (EUA). This EUA will remain  in effect (meaning this test can be used) for the duration of the COVID-19 declaration under Section 564(b)(1) of the Act, 21 U.S.C.section 360bbb-3(b)(1), unless the authorization is terminated  or revoked sooner.       Influenza A by PCR NEGATIVE NEGATIVE Final   Influenza B by PCR NEGATIVE NEGATIVE Final    Comment: (NOTE) The Xpert Xpress SARS-CoV-2/FLU/RSV plus assay is intended as an aid in the diagnosis of influenza from Nasopharyngeal swab specimens and should not be used as a sole basis for treatment. Nasal washings and aspirates are unacceptable for Xpert Xpress SARS-CoV-2/FLU/RSV testing.  Fact Sheet for Patients: EntrepreneurPulse.com.au  Fact Sheet for Healthcare Providers: IncredibleEmployment.be  This test is not yet approved or cleared by the [place] FDA and has been authorized for detection and/or diagnosis of SARS-CoV-2 by FDA under an Emergency Use Authorization (EUA). This EUA will remain in effect (meaning this test can be used) for the duration of the COVID-19 declaration under Section 564(b)(1) of the Act, 21 U.S.C. section 360bbb-3(b)(1), unless the authorization is terminated or revoked.  Performed at [facility], [address] [place], [place] [address]   Blood culture (routine x 2)     Status: Abnormal   Collection Time: 11/20/21 11:32 PM   Specimen: BLOOD  Result Value Ref Range Status   Specimen Description   Final    BLOOD RIGHT FOREARM Performed at [facility], [address] [place], [place] [address]    Special Requests   Final    BOTTLES DRAWN AEROBIC AND ANAEROBIC Blood Culture results may not be optimal due to an excessive volume of blood received in culture bottles Performed at [facility], [address], [place], [place] [address]    Culture  Setup Time   Final    GRAM NEGATIVE RODS IN BOTH AEROBIC AND ANAEROBIC BOTTLES CRITICAL VALUE NOTED.  VALUE IS CONSISTENT WITH PREVIOUSLY REPORTED AND CALLED VALUE. Performed at [facility], [address] [place], [place] [address]    Culture (A)  Final    KLEBSIELLA PNEUMONIAE SUSCEPTIBILITIES PERFORMED ON PREVIOUS CULTURE WITHIN THE LAST 5 DAYS. Performed at [facility], [place] [address], [place], [place] [address]    Report Status 11/23/2021 FINAL  Final  Blood culture (routine x 2)     Status: Abnormal   Collection Time: 11/20/21 11:32 PM   Specimen: BLOOD  Result Value Ref Range Status   Specimen Description   Final    BLOOD RIGHT  ASSIST CONTROL Performed at [facility], [address] [place], [place] [address]    Special Requests   Final    BOTTLES DRAWN AEROBIC AND ANAEROBIC Blood Culture adequate volume Performed at [facility], [address] [place], [place] [address]    Culture  Setup Time   Final    GRAM NEGATIVE RODS IN BOTH AEROBIC AND ANAEROBIC BOTTLES Organism ID to follow CRITICAL RESULT CALLED TO, READ BACK BY AND VERIFIED WITHArdeen Garland PHARMD [id number] 11/21/21 HNM Performed at [facility], [address] [place], [place] [address]    Culture KLEBSIELLA PNEUMONIAE (A)  Final   Report Status 11/23/2021 FINAL  Final   Organism ID, Bacteria KLEBSIELLA PNEUMONIAE  Final      Susceptibility   Klebsiella pneumoniae - MIC*    AMPICILLIN RESISTANT Resistant     CEFAZOLIN <=4 SENSITIVE Sensitive     CEFEPIME <=0.12 SENSITIVE Sensitive     CEFTAZIDIME <=1 SENSITIVE Sensitive     CEFTRIAXONE <=0.25 SENSITIVE Sensitive     CIPROFLOXACIN <=0.25 SENSITIVE Sensitive     GENTAMICIN <=1 SENSITIVE Sensitive     IMIPENEM <=0.25 SENSITIVE Sensitive     TRIMETH/SULFA <=20 SENSITIVE Sensitive     AMPICILLIN/SULBACTAM 4 SENSITIVE Sensitive     PIP/TAZO <=4  SENSITIVE Sensitive     * KLEBSIELLA PNEUMONIAE  Blood Culture ID Panel (Reflexed)     Status: Abnormal   Collection Time: 11/20/21 11:32 PM  Result Value Ref Range Status   Enterococcus faecalis NOT DETECTED NOT DETECTED Final   Enterococcus Faecium NOT DETECTED NOT DETECTED Final   Listeria monocytogenes NOT DETECTED NOT DETECTED Final   Staphylococcus species NOT DETECTED NOT DETECTED Final   Staphylococcus aureus (BCID) NOT DETECTED NOT DETECTED Final   Staphylococcus epidermidis NOT DETECTED NOT DETECTED Final   Staphylococcus lugdunensis NOT DETECTED NOT DETECTED Final   Streptococcus species NOT DETECTED NOT DETECTED Final   Streptococcus agalactiae NOT DETECTED NOT DETECTED Final   Streptococcus pneumoniae NOT DETECTED NOT DETECTED Final   Streptococcus pyogenes NOT DETECTED NOT DETECTED Final   A.calcoaceticus-baumannii NOT DETECTED NOT DETECTED Final   Bacteroides fragilis NOT DETECTED NOT DETECTED Final   Enterobacterales DETECTED (A) NOT DETECTED Final    Comment: Enterobacterales represent a large order of gram negative bacteria, not a single organism. CRITICAL RESULT CALLED TO, READ BACK BY AND VERIFIED WITH: MORGAN HICKS PHARMD [id number] 11/21/21 HNM    Enterobacter cloacae complex NOT DETECTED NOT DETECTED Final   Escherichia coli NOT DETECTED NOT DETECTED Final   Klebsiella aerogenes NOT DETECTED NOT DETECTED Final   Klebsiella oxytoca NOT DETECTED NOT DETECTED Final   Klebsiella pneumoniae DETECTED (A) NOT DETECTED Final    Comment: CRITICAL RESULT CALLED TO, READ BACK BY AND VERIFIED WITH: MORGAN HICKS PHARMD 1039 11/21/21 HNM    Proteus species NOT DETECTED NOT DETECTED Final   Salmonella species NOT DETECTED NOT DETECTED Final   Serratia marcescens NOT DETECTED NOT DETECTED Final   Haemophilus influenzae NOT DETECTED NOT DETECTED Final   Neisseria meningitidis NOT DETECTED NOT DETECTED Final   Pseudomonas aeruginosa NOT DETECTED NOT DETECTED Final    Stenotrophomonas maltophilia NOT DETECTED NOT DETECTED Final   Candida albicans NOT DETECTED NOT DETECTED Final   Candida auris NOT DETECTED NOT DETECTED Final   Candida glabrata NOT DETECTED NOT DETECTED Final   Candida krusei NOT DETECTED NOT DETECTED Final   Candida parapsilosis NOT DETECTED NOT DETECTED Final   Candida tropicalis NOT DETECTED NOT DETECTED  Final   Cryptococcus neoformans/gattii NOT DETECTED NOT DETECTED Final   CTX-M ESBL NOT DETECTED NOT DETECTED Final   Carbapenem resistance IMP NOT DETECTED NOT DETECTED Final   Carbapenem resistance KPC NOT DETECTED NOT DETECTED Final   Carbapenem resistance NDM NOT DETECTED NOT DETECTED Final   Carbapenem resist OXA 48 LIKE NOT DETECTED NOT DETECTED Final   Carbapenem resistance VIM NOT DETECTED NOT DETECTED Final    Comment: Performed at [facility], [address], [place], [place] [address]  Urine Culture     Status: Abnormal   Collection Time: 11/20/21 11:33 PM   Specimen: Urine, Random  Result Value Ref Range Status   Specimen Description   Final    URINE, RANDOM Performed at [facility], [address], [place], [place] [address]    Special Requests   Final    NONE Performed at [facility], [address], [place],  [address]    Culture >=100,000 COLONIES/mL KLEBSIELLA PNEUMONIAE (A)  Final   Report Status 11/23/2021 FINAL  Final   Organism ID, Bacteria KLEBSIELLA PNEUMONIAE (A)  Final      Susceptibility   Klebsiella pneumoniae - MIC*    AMPICILLIN RESISTANT Resistant     CEFAZOLIN <=4 SENSITIVE Sensitive     CEFEPIME <=0.12 SENSITIVE Sensitive     CEFTRIAXONE <=0.25 SENSITIVE Sensitive     CIPROFLOXACIN <=0.25 SENSITIVE Sensitive     GENTAMICIN <=1 SENSITIVE Sensitive     IMIPENEM <=0.25 SENSITIVE Sensitive     NITROFURANTOIN 128 RESISTANT Resistant     TRIMETH/SULFA <=20 SENSITIVE Sensitive     AMPICILLIN/SULBACTAM 4 SENSITIVE Sensitive     PIP/TAZO <=4 SENSITIVE  Sensitive     * >=100,000 COLONIES/mL KLEBSIELLA PNEUMONIAE    Coagulation Studies: No results for input(s): LABPROT, INR in the last 72 hours.   Urinalysis: No results for input(s): COLORURINE, LABSPEC, PHURINE, GLUCOSEU, HGBUR, BILIRUBINUR, KETONESUR, PROTEINUR, UROBILINOGEN, NITRITE, LEUKOCYTESUR in the last 72 hours.  Invalid input(s): APPERANCEUR    Imaging: No results found.   Medications:      amLODipine  10 mg Oral Daily   aspirin EC  81 mg Oral Daily   atorvastatin  40 mg Oral QHS   Chlorhexidine Gluconate Cloth  6 each Topical Q0600   ciprofloxacin  500 mg Oral Q breakfast   ferrous sulfate  325 mg Oral Q breakfast   heparin  5,000 Units Subcutaneous Q8H   lubiprostone  24 mcg Oral BID WC   polyethylene glycol  17 g Oral Daily   pregabalin  25 mg Oral Daily   pregabalin  75 mg Oral BID   primidone  50 mg Oral BID   senna-docusate  1 tablet Oral BID   sodium bicarbonate  650 mg Oral BID   tamsulosin  0.4 mg Oral Daily   acetaminophen **OR** acetaminophen, cyclobenzaprine, ondansetron **OR** ondansetron (ZOFRAN) IV, oxyCODONE  Assessment/ Plan:  [name] is a 61 y.o.  male SNF resident with diabetes mellitus type II, paraplegia, GERD, blindness, glaucoma, hypertension and hyperlipidemia who was admitted to [facility] on 11/20/2021 for UTI (urinary tract infection) [N39.0] AKI (acute kidney injury) ([place]) [N17.9] Urinary tract infection without hematuria, site unspecified [N39.0] Pneumonia due to infectious organism, unspecified laterality, unspecified part of lung [J18.9]   Acute kidney injury on chronic kidney disease stage IV : with maturing AVF. Placed on 12/16 Dr. Delana Meyer.  Baseline creatinine 3.07/GFR 22 from November 06, 2021 Patient was admitted with hyperkalemia, metabolic acidosis and  uremia. Patient required renal replacement therapy Patient had a temporary catheter placed and first dialysis treatment was performed on November 21, 2021 , dec 29  and November 23, 2021 and temporary catheter has since been removed Patient's radiological work-up showed-CT abd/pelvis shows hydronephrosis and bladder scan showed fluid retention.  Required Foley catheter placement Current urine output reported at 4300 cc as noted below No acute indication for dialysis at present Follow-up with [facility] nephrology as outpatient  Lab Results  Component Value Date   CREATININE 4.01 (H) 11/25/2021   CREATININE 3.66 (H) 11/24/2021   CREATININE 3.73 (H) 11/24/2021    Intake/Output Summary (Last 24 hours) at 11/26/2021 1402 Last data filed at 11/26/2021 0930 Gross per 24 hour  Intake 360 ml  Output 4300 ml  Net -3940 ml    Anemia of chronic kidney disease:  Hemoglobin is not at goal Patient has iron deficiency anemia as patient saturation was low at 7% on December 28 With the patient's active infection we will hold off on giving IV iron We will give patient p.o. iron for now   Hypertension:   Hypertension Patient blood pressure control is acceptable   Urinary tract infection:  blood cultures and urine culture positive with Klebsiella species.  Antibiotics as per hospitalist team.     LOS: [place] 1/2/20232:02 PM

## 2021-11-29 ENCOUNTER — Other Ambulatory Visit (INDEPENDENT_AMBULATORY_CARE_PROVIDER_SITE_OTHER): Payer: Self-pay | Admitting: Vascular Surgery

## 2021-11-29 DIAGNOSIS — N186 End stage renal disease: Secondary | ICD-10-CM

## 2021-11-29 DIAGNOSIS — Z9889 Other specified postprocedural states: Secondary | ICD-10-CM

## 2021-12-03 ENCOUNTER — Encounter (INDEPENDENT_AMBULATORY_CARE_PROVIDER_SITE_OTHER): Payer: Medicaid Other

## 2021-12-03 ENCOUNTER — Ambulatory Visit (INDEPENDENT_AMBULATORY_CARE_PROVIDER_SITE_OTHER): Payer: Medicaid Other | Admitting: Vascular Surgery

## 2021-12-05 ENCOUNTER — Ambulatory Visit: Payer: Self-pay | Admitting: Urology

## 2021-12-05 ENCOUNTER — Ambulatory Visit: Payer: Self-pay | Admitting: Physician Assistant

## 2021-12-12 ENCOUNTER — Ambulatory Visit: Payer: Medicaid Other | Admitting: Urology

## 2021-12-12 ENCOUNTER — Ambulatory Visit (INDEPENDENT_AMBULATORY_CARE_PROVIDER_SITE_OTHER): Payer: Medicaid Other | Admitting: Urology

## 2021-12-12 ENCOUNTER — Other Ambulatory Visit: Payer: Self-pay

## 2021-12-12 ENCOUNTER — Encounter: Payer: Self-pay | Admitting: Urology

## 2021-12-12 VITALS — BP 107/67 | HR 72 | Ht 73.0 in | Wt 181.0 lb

## 2021-12-12 DIAGNOSIS — R339 Retention of urine, unspecified: Secondary | ICD-10-CM

## 2021-12-12 DIAGNOSIS — Z466 Encounter for fitting and adjustment of urinary device: Secondary | ICD-10-CM | POA: Diagnosis not present

## 2021-12-12 LAB — BLADDER SCAN AMB NON-IMAGING

## 2021-12-12 MED ORDER — CEPHALEXIN 250 MG PO CAPS
500.0000 mg | ORAL_CAPSULE | Freq: Once | ORAL | Status: AC
  Administered 2021-12-12: 500 mg via ORAL
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
## 2021-12-26 ENCOUNTER — Other Ambulatory Visit: Payer: Self-pay

## 2021-12-26 ENCOUNTER — Ambulatory Visit (INDEPENDENT_AMBULATORY_CARE_PROVIDER_SITE_OTHER): Payer: Medicaid Other | Admitting: Urology

## 2021-12-26 VITALS — BP 156/90 | HR 76

## 2021-12-26 DIAGNOSIS — R339 Retention of urine, unspecified: Secondary | ICD-10-CM

## 2021-12-26 MED ORDER — SULFAMETHOXAZOLE-TRIMETHOPRIM 800-160 MG PO TABS
1.0000 | ORAL_TABLET | Freq: Once | ORAL | Status: AC
  Administered 2021-12-26: 1 via ORAL

## 2021-12-26 NOTE — Progress Notes (Signed)
Patient ID: TAIDEN RAYBOURN, male   DOB: 1961/06/29, 61 y.o.   MRN: [id number] Catheter Removal  Patient is present today for a catheter removal.  66ml of water was drained from the balloon. A 14FR foley cath was removed from the bladder no complications were noted . Patient tolerated well.  Performed by: Edwin Dada, CMA  Follow up/ Additional notes: 3 weeks for cysto

## 2021-12-26 NOTE — Patient Instructions (Signed)
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
## 2021-12-27 ENCOUNTER — Telehealth: Payer: Self-pay

## 2021-12-27 NOTE — Telephone Encounter (Signed)
Kristen from [employer] called stating pt did not have any urine output since he left clinic yesterday. The in house MD had nurse place a 35F catheter this afternoon with immediate urine output. [facility] wanted [place] to be aware and would like to know if pt needs to come in sooner. [phone]

## 2021-12-31 ENCOUNTER — Telehealth (INDEPENDENT_AMBULATORY_CARE_PROVIDER_SITE_OTHER): Payer: Self-pay

## 2021-12-31 NOTE — Telephone Encounter (Signed)
Sent pts scheduled appt in the mail to [facility].

## 2021-12-31 NOTE — Telephone Encounter (Signed)
[name] from [facility] called about pt  getting dialysis I spoke to Dr. Delana Meyer an per him the pt should be scheduled for an office visit and vein mapping  .

## 2022-01-03 NOTE — Telephone Encounter (Signed)
Appt currently booked as cysto.

## 2022-01-03 NOTE — Telephone Encounter (Signed)
His follow-up appointment should be booked as a cystoscopy to evaluate causes of his retention and discuss treatment options  Nickolas Madrid, MD 01/03/2022

## 2022-01-17 ENCOUNTER — Ambulatory Visit (INDEPENDENT_AMBULATORY_CARE_PROVIDER_SITE_OTHER): Payer: Medicaid Other | Admitting: Urology

## 2022-01-17 ENCOUNTER — Other Ambulatory Visit: Payer: Self-pay

## 2022-01-17 ENCOUNTER — Other Ambulatory Visit: Payer: Self-pay | Admitting: Urology

## 2022-01-17 ENCOUNTER — Encounter: Payer: Self-pay | Admitting: Urology

## 2022-01-17 VITALS — BP 133/82 | HR 80 | Ht 73.0 in

## 2022-01-17 DIAGNOSIS — N138 Other obstructive and reflux uropathy: Secondary | ICD-10-CM

## 2022-01-17 DIAGNOSIS — Z01818 Encounter for other preprocedural examination: Secondary | ICD-10-CM | POA: Diagnosis not present

## 2022-01-17 DIAGNOSIS — R339 Retention of urine, unspecified: Secondary | ICD-10-CM | POA: Diagnosis not present

## 2022-01-17 MED ORDER — LIDOCAINE HCL URETHRAL/MUCOSAL 2 % EX GEL
1.0000 "application " | Freq: Once | CUTANEOUS | Status: AC
  Administered 2022-01-17: 1 via URETHRAL

## 2022-01-17 NOTE — Progress Notes (Signed)
Surgical Physician Order Form [facility]  * Scheduling expectation : Next Available  *Length of Case: 1.5 hours  *Clearance needed: no  *Anticoagulation Instructions: Hold all anticoagulants  *Aspirin Instructions: Hold Aspirin  *Post-op visit Date/Instructions:  1-3 day voiding trial-needs PM PVR   *Diagnosis: BPH w/BOO  *Procedure: HOLEP (25271)   Additional orders: N/A  -Admit type: OUTpatient  -Anesthesia: General  -VTE Prophylaxis Standing Order SCDs       Other:   -Standing Lab Orders Per Anesthesia    Lab other: UA&Urine Culture (01/17/2022)  -Standing Test orders EKG/Chest x-ray per Anesthesia       Test other:   - Medications:  Ancef 2gm IV  -Other orders:  N/A   Patient lives in a SNF long-term, and will need to coordinate with facility

## 2022-01-17 NOTE — Progress Notes (Signed)
Catheter Removal  Patient is present today for a catheter removal. 16ml of water was drained from the balloon. A 14FR foley cath was removed from the bladder no complications were noted. Patient tolerated well.  Performed by: Gordy Clement, [place]  Follow up/ Additional notes: See below.  Simple Catheter Placement  Due to urinary retention patient is present today for a foley cath placement.  Patient was cleaned and prepped in a sterile fashion with betadine. Lidocaine 2% jelly was inserted into the urethra. A 18 FR foley catheter was inserted, urine return was noted  149ml, urine was clear in color.  The balloon was filled with 10cc of sterile water. A night bag was attached for drainage. Patient was given instruction on proper catheter care.  Patient tolerated well, no complications were noted.   Performed by: Gordy Clement, CMA  Additional notes/ Follow up: RTC as scheduled.

## 2022-01-17 NOTE — Patient Instructions (Addendum)
Urine was sent for pre-op culture today Melissa will be calling to set up a surgery day and time for patient 52 FR foley was placed today Please keep catheter off tension to avoid penis erosion     Holmium Laser Enucleation of the Prostate (HoLEP)  HoLEP is a treatment for men with benign prostatic hyperplasia (BPH). The laser surgery removed blockages of urine flow, and is done without any incisions on the body.     What is HoLEP?  HoLEP is a type of laser surgery used to treat obstruction (blockage) of urine flow as a result of benign prostatic hyperplasia (BPH). In men with BPH, the prostate gland is not cancerous, but has become enlarged. An enlarged prostate can result in a number of urinary tract symptoms such as weak urinary stream, difficulty in starting urination, inability to urinate, frequent urination, or getting up at night to urinate.  HoLEP was developed in the 1990's as a more effective and less expensive surgical option for BPH, compared to other surgical options such as laser vaporization(PVP/greenlight laser), transurethral resection of the prostate(TURP), and open simple prostatectomy.   What happens during a HoLEP?  HoLEP requires general anesthesia (asleep throughout the procedure).   An antibiotic is given to reduce the risk of infection  A surgical instrument called a resectoscope is inserted through the urethra (the tube that carries urine from the bladder). The resectoscope has a camera that allows the surgeon to view the internal structure of the prostate gland, and to see where the incisions are being made during surgery.  The laser is inserted into the resectoscope and is used to enucleate (free up) the enlarged prostate tissue from the capsule (outer shell) and then to seal up any blood vessels. The tissue that has been removed is pushed back into the bladder.  A morcellator is placed through the resectoscope, and is used to suction out the prostate tissue  that has been pushed into the bladder.  When the prostate tissue has been removed, the resectoscope is removed, and a foley catheter is placed to allow healing and drain the urine from the bladder.     What happens after a HoLEP?  More than 90% of patients go home the same day a few hours after surgery. Less than 10% will be admitted to the hospital overnight for observation to monitor the urine, or if they have other medical problems.  Fluid is flushed through the catheter for about 1 hour after surgery to clear any blood from the urine. It is normal to have some blood in the urine after surgery. The need for blood transfusion is extremely rare.  Eating and drinking are permitted after the procedure once the patient has fully awakened from anesthesia.  The catheter is usually removed 2-3 days after surgery- the patient will come to clinic to have the catheter removed and make sure they can urinate on their own.  It is very important to drink lots of fluids after surgery for one week to keep the bladder flushed.  At first, there may be some burning with urination, but this typically improved within a few hours to days. Most patients do not have a significant amount of pain, and narcotic pain medications are rarely needed.  Symptoms of urinary frequency, urgency, and even leakage are NORMAL for the first few weeks after surgery as the bladder adjusts after having to work hard against blockage from the prostate for many years. This will improve, but can sometimes take several  months.  The use of pelvic floor exercises (Kegel exercises) can help improve problems with urinary incontinence.   After catheter removal, patients will be seen at 6 weeks and 6 months for symptom check  No heavy lifting for at least 2-3 weeks after surgery, however patients can walk and do light activities the first day after surgery. Return to work time depends on occupation.    What are the advantages of  HoLEP?  HoLEP has been studied in many different parts of the world and has been shown to be a safe and effective procedure. Although there are many types of BPH surgeries available, HoLEP offers a unique advantage in being able to remove a large amount of tissue without any incisions on the body, even in very large prostates, while decreasing the risk of bleeding and providing tissue for pathology (to look for cancer). This decreases the need for blood transfusions during surgery, minimizes hospital stay, and reduces the risk of needing repeat treatment.  What are the side effects of HoLEP?  Temporary burning and bleeding during urination. Some blood may be seen in the urine for weeks after surgery and is part of the healing process.  Urinary incontinence (inability to control urine flow) is expected in all patients immediately after surgery and they should wear pads for the first few days/weeks. This typically improves over the course of several weeks. Performing Kegel exercises can help decrease leakage from stress maneuvers such as coughing, sneezing, or lifting. The rate of long term leakage is very low. Patients may also have leakage with urgency and this may be treated with medication. The risk of urge incontinence can be dependent on several factors including age, prostate size, symptoms, and other medical problems.  Retrograde ejaculation or backwards ejaculation. In 75% of cases, the patient will not see any fluid during ejaculation after surgery.  Erectile function is generally not significantly affected.   What are the risks of HoLEP?  Injury to the urethra or development of scar tissue at a later date  Injury to the capsule of the prostate (typically treated with longer catheterization).  Injury to the bladder or ureteral orifices (where the urine from the kidney drains out)  Infection of the bladder, testes, or kidneys  Return of urinary obstruction at a later date requiring  another operation (<2%)  Need for blood transfusion or re-operation due to bleeding  Failure to relieve all symptoms and/or need for prolonged catheterization after surgery  5-15% of patients are found to have previously undiagnosed prostate cancer in their specimen. Prostate cancer can be treated after HoLEP.  Standard risks of anesthesia including blood clots, heart attacks, etc  When should I call my doctor?  Fever over 101.3 degrees  Inability to urinate, or large blood clots in the urine

## 2022-01-18 NOTE — Progress Notes (Signed)
[facility] Posting Form   Surgery Date/Time: Date: 02/01/2022  Surgeon: Dr. Nickolas Madrid, MD  Surgery Location: Day Surgery  Inpt ( No  )   Outpt (Yes)   Obs ( No  )   Diagnosis: BPH with Bladder Outlet Obstruction  -CPT: 15872  Surgery: Holmium Laser Enucleation of the Prostate  Stop Anticoagulations: Yes, Hold ASA  Cardiac/Medical/Pulmonary Clearance needed: no  *Orders entered into EPIC  Date: 01/18/22   *Case booked in [place]  Date: 01/17/2022  *Notified pt of Surgery: Date: 01/17/2022  PRE-OP UA & CX: Yes, obtained in clinic 01/17/2022  *Placed into Prior Authorization Work Fabio Bering Date: 01/18/22   Assistant/laser/rep:No

## 2022-01-18 NOTE — Progress Notes (Signed)
Cystoscopy Procedure Note:  Indication: Urinary retention  Extremely comorbid 61 year old male who developed urinary retention with UTI and bilateral hydronephrosis and has failed multiple voiding trials despite Flomax.  Prostate measures 22 g on CT.  After informed consent and discussion of the procedure and its risks, LEVOY GEISEN was positioned and prepped in the standard fashion. Cystoscopy was performed with a flexible cystoscope. The urethra, bladder neck and entire bladder was visualized in a standard fashion. The prostate was short with lateral lobe hypertrophy and a high bladder neck.  The ureteral orifices were visualized in their normal location and orientation.  Mild catheter erythema at the trigone, but no suspicious tumors or lesions.  No abnormalities on retroflexion.  Findings: Short prostate with high bladder neck, no suspicious bladder lesions  --------------------------------------------------------------------------------------------  Assessment and Plan: Extremely comorbid 61 year old male with Foley dependent urinary retention who has failed multiple voiding trials, and prostate measures 22 g on CT with no other abnormality seen on cystoscopy.  We reviewed possible etiologies including atonic/neurogenic bladder, or BPH with outlet obstruction.  We reviewed management options including chronic indwelling Foley catheter, suprapubic tube, intermittent catheterization by staff at his facility, urodynamics, or an outlet procedure with HOLEP to decrease resistance.  We discussed that even with HOLEP, he may not be able to void if his bladder is atonic/nonfunctional.  He understands these risks.  He would like to attempt any procedure that could get him back to voiding spontaneously and prevent him from having to have a long-term catheter.  We also discussed his renal failure could potentially be resulting from long-term obstruction and backup, and this may stabilize his renal  function and at least delayed dialysis.  Schedule HOLEP  Nickolas Madrid, MD 01/18/2022

## 2022-01-22 LAB — CULTURE, URINE COMPREHENSIVE

## 2022-01-25 ENCOUNTER — Inpatient Hospital Stay: Admission: RE | Admit: 2022-01-25 | Payer: Medicaid Other | Source: Ambulatory Visit

## 2022-01-28 ENCOUNTER — Other Ambulatory Visit: Payer: Self-pay

## 2022-01-28 ENCOUNTER — Encounter (INDEPENDENT_AMBULATORY_CARE_PROVIDER_SITE_OTHER): Payer: Self-pay | Admitting: Vascular Surgery

## 2022-01-28 ENCOUNTER — Ambulatory Visit (INDEPENDENT_AMBULATORY_CARE_PROVIDER_SITE_OTHER): Payer: Medicaid Other | Admitting: Vascular Surgery

## 2022-01-28 ENCOUNTER — Ambulatory Visit (INDEPENDENT_AMBULATORY_CARE_PROVIDER_SITE_OTHER): Payer: Medicaid Other

## 2022-01-28 VITALS — BP 109/77 | HR 80 | Resp 16 | Ht 74.0 in

## 2022-01-28 DIAGNOSIS — E782 Mixed hyperlipidemia: Secondary | ICD-10-CM | POA: Diagnosis not present

## 2022-01-28 DIAGNOSIS — N185 Chronic kidney disease, stage 5: Secondary | ICD-10-CM

## 2022-01-28 DIAGNOSIS — Z9889 Other specified postprocedural states: Secondary | ICD-10-CM

## 2022-01-28 DIAGNOSIS — I2510 Atherosclerotic heart disease of native coronary artery without angina pectoris: Secondary | ICD-10-CM

## 2022-01-28 DIAGNOSIS — I10 Essential (primary) hypertension: Secondary | ICD-10-CM

## 2022-01-28 DIAGNOSIS — N186 End stage renal disease: Secondary | ICD-10-CM

## 2022-01-28 DIAGNOSIS — E1121 Type 2 diabetes mellitus with diabetic nephropathy: Secondary | ICD-10-CM
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
## 2022-01-29 ENCOUNTER — Other Ambulatory Visit: Payer: Self-pay

## 2022-01-29 ENCOUNTER — Other Ambulatory Visit
Admission: RE | Admit: 2022-01-29 | Discharge: 2022-01-29 | Disposition: A | Discharge status: Home / Self Care | Payer: Medicaid Other | Source: Ambulatory Visit | Attending: Urology | Admitting: Urology

## 2022-01-29 HISTORY — DX: Constipation, unspecified: K59.00

## 2022-01-29 HISTORY — DX: Other muscle spasm: M62.838

## 2022-01-29 HISTORY — DX: Gout, unspecified: M10.9

## 2022-01-29 HISTORY — DX: Anemia, unspecified: D64.9

## 2022-01-29 HISTORY — DX: Tremor, unspecified: R25.1

## 2022-01-29 HISTORY — DX: Muscle weakness (generalized): M62.81

## 2022-01-29 HISTORY — DX: Urinary tract infection, site not specified: N39.0

## 2022-01-29 HISTORY — DX: Disorder of parathyroid gland, unspecified: E21.5

## 2022-01-29 HISTORY — DX: Hypomagnesemia: E83.42

## 2022-01-29 HISTORY — DX: Vitamin D deficiency, unspecified: E55.9

## 2022-01-29 HISTORY — DX: Heart failure, unspecified: I50.9

## 2022-01-29 HISTORY — DX: Acquired ichthyosis: L85.0

## 2022-01-29 HISTORY — DX: Abnormal weight loss: R63.4

## 2022-01-29 NOTE — Patient Instructions (Signed)
Your procedure is scheduled on: 02/01/22 - Friday ?Report to the Registration Desk on the 1st floor of the [facility]. ?To find out your arrival time, please call [phone] between 1PM - 3PM on: 01/31/22 - Thursday ? ?REMEMBER: ?Instructions that are not followed completely may result in serious medical risk, up to and including death; or upon the discretion of your surgeon and anesthesiologist your surgery may need to be rescheduled. ? ?Do not eat food or drink any fluids after midnight the night before surgery.  ?No gum chewing, lozengers or hard candies. ? ?TAKE THESE MEDICATIONS THE MORNING OF SURGERY WITH A SIP OF WATER: ? ?- tamsulosin (FLOMAX) 0.4 MG CAPS capsule ?- Oxycodone HCl 10 MG TABS if needed ?- primidone (MYSOLINE) 50 MG tablet ? ?One week prior to surgery: ?Stop Anti-inflammatories (NSAIDS) such as Advil, Aleve, Ibuprofen, Motrin, Naproxen, Naprosyn and Aspirin based products such as Excedrin, Goodys Powder, BC Powder. ? ?Stop ANY OVER THE COUNTER supplements until after surgery. ? ?You may however, continue to take Tylenol if needed for pain up until the day of surgery. ? ?No Alcohol for 24 hours before or after surgery. ? ?No Smoking including e-cigarettes for 24 hours prior to surgery.  ?No chewable tobacco products for at least 6 hours prior to surgery.  ?No nicotine patches on the day of surgery. ? ?Do not use any "recreational" drugs for at least a week prior to your surgery.  ?Please be advised that the combination of cocaine and anesthesia may have negative outcomes, up to and including death. ?If you test positive for cocaine, your surgery will be cancelled. ? ?On the morning of surgery brush your teeth with toothpaste and water, you may rinse your mouth with mouthwash if you wish. ?Do not swallow any toothpaste or mouthwash. ? ?Use CHG Soap or wipes as directed on instruction sheet. ? ?Do not wear jewelry, make-up, hairpins, clips or nail polish. ? ?Do not wear lotions, powders, or  perfumes.  ? ?Do not shave body from the neck down 48 hours prior to surgery just in case you cut yourself which could leave a site for infection.  ?Also, freshly shaved skin may become irritated if using the CHG soap. ? ?Contact lenses, hearing aids and dentures may not be worn into surgery. ? ?Do not bring valuables to the hospital. [facility] is not responsible for any missing/lost belongings or valuables.  ? ?Notify your doctor if there is any change in your medical condition (cold, fever, infection). ? ?Wear comfortable clothing (specific to your surgery type) to the hospital. ? ?After surgery, you can help prevent lung complications by doing breathing exercises.  ?Take deep breaths and cough every 1-2 hours. Your doctor may order a device called an Incentive Spirometer to help you take deep breaths. ?When coughing or sneezing, hold a pillow firmly against your incision with both hands. This is called ?splinting.? Doing this helps protect your incision. It also decreases belly discomfort. ? ?If you are being admitted to the hospital overnight, leave your suitcase in the car. ?After surgery it may be brought to your room. ? ?If you are being discharged the day of surgery, you will not be allowed to drive home. ?You will need a responsible adult (18 years or older) to drive you home and stay with you that night.  ? ?If you are taking public transportation, you will need to have a responsible adult (18 years or older) with you. ?Please confirm with your physician that it  is acceptable to use public transportation.  ? ?Please call the [facility] at [phone] if you have any questions about these instructions. ? ?Surgery Visitation Policy: ? ?Patients undergoing a surgery or procedure may have one family member or support person with them as long as that person is not COVID-19 positive or experiencing its symptoms.  ?That person may remain in the waiting area during the procedure and may  rotate out with other people. ? ?Inpatient Visitation:   ? ?Visiting hours are 7 a.m. to 8 p.m. ?Up to two visitors ages 16+ are allowed at one time in a patient room. The visitors may rotate out with other people during the day. Visitors must check out when they leave, or other visitors will not be allowed. One designated support person may remain overnight. ?The visitor must pass COVID-19 screenings, use hand sanitizer when entering and exiting the patient?s room and wear a mask at all times, including in the patient?s room. ?Patients must also wear a mask when staff or their visitor are in the room. ?Masking is required regardless of vaccination status.  ?

## 2022-01-30 ENCOUNTER — Encounter (INDEPENDENT_AMBULATORY_CARE_PROVIDER_SITE_OTHER): Payer: Self-pay | Admitting: Vascular Surgery

## 2022-01-30 DIAGNOSIS — I2510 Atherosclerotic heart disease of native coronary artery without angina pectoris: Secondary | ICD-10-CM | POA: Insufficient documentation

## 2022-02-01 ENCOUNTER — Encounter: Payer: Self-pay | Admitting: Urology

## 2022-02-01 ENCOUNTER — Ambulatory Visit: Payer: Medicaid Other | Admitting: Urgent Care

## 2022-02-01 ENCOUNTER — Other Ambulatory Visit: Payer: Self-pay

## 2022-02-01 ENCOUNTER — Ambulatory Visit
Admission: RE | Admit: 2022-02-01 | Discharge: 2022-02-01 | Disposition: A | Discharge status: Skilled Nursing Facility | Payer: Medicaid Other | Attending: Urology | Admitting: Urology

## 2022-02-01 ENCOUNTER — Encounter
Admission: RE | Disposition: A | Discharge status: Skilled Nursing Facility | Payer: Self-pay | Source: Home / Self Care | Attending: Urology

## 2022-02-01 DIAGNOSIS — K219 Gastro-esophageal reflux disease without esophagitis: Secondary | ICD-10-CM | POA: Diagnosis not present

## 2022-02-01 DIAGNOSIS — N401 Enlarged prostate with lower urinary tract symptoms: Secondary | ICD-10-CM

## 2022-02-01 DIAGNOSIS — N138 Other obstructive and reflux uropathy: Secondary | ICD-10-CM

## 2022-02-01 DIAGNOSIS — I509 Heart failure, unspecified: Secondary | ICD-10-CM | POA: Diagnosis not present

## 2022-02-01 DIAGNOSIS — R338 Other retention of urine: Secondary | ICD-10-CM | POA: Diagnosis present

## 2022-02-01 DIAGNOSIS — N184 Chronic kidney disease, stage 4 (severe): Secondary | ICD-10-CM | POA: Diagnosis not present

## 2022-02-01 DIAGNOSIS — I130 Hypertensive heart and chronic kidney disease with heart failure and stage 1 through stage 4 chronic kidney disease, or unspecified chronic kidney disease: Secondary | ICD-10-CM | POA: Insufficient documentation

## 2022-02-01 DIAGNOSIS — E1122 Type 2 diabetes mellitus with diabetic chronic kidney disease: Secondary | ICD-10-CM | POA: Diagnosis not present

## 2022-02-01 DIAGNOSIS — R339 Retention of urine, unspecified: Secondary | ICD-10-CM | POA: Diagnosis not present

## 2022-02-01 HISTORY — PX: HOLEP-LASER ENUCLEATION OF THE PROSTATE WITH MORCELLATION: SHX6641

## 2022-02-01 LAB — POCT I-STAT, CHEM 8
BUN: 61 mg/dL — ABNORMAL HIGH (ref 8–23)
Calcium, Ion: 1.23 mmol/L (ref 1.15–1.40)
Chloride: 108 mmol/L (ref 98–111)
Creatinine, Ser: 3.5 mg/dL — ABNORMAL HIGH (ref 0.61–1.24)
Glucose, Bld: 75 mg/dL (ref 70–99)
HCT: 36 % — ABNORMAL LOW (ref 39.0–52.0)
Hemoglobin: 12.2 g/dL — ABNORMAL LOW (ref 13.0–17.0)
Potassium: 5 mmol/L (ref 3.5–5.1)
Sodium: 140 mmol/L (ref 135–145)
TCO2: 24 mmol/L (ref 22–32)

## 2022-02-01 LAB — GLUCOSE, CAPILLARY: Glucose-Capillary: 117 mg/dL — ABNORMAL HIGH (ref 70–99)

## 2022-02-01 SURGERY — ENUCLEATION, PROSTATE, USING LASER, WITH MORCELLATION
Anesthesia: General

## 2022-02-01 MED ORDER — CEFAZOLIN SODIUM-DEXTROSE 2-4 GM/100ML-% IV SOLN
2.0000 g | INTRAVENOUS | Status: AC
  Administered 2022-02-01: 2 g via INTRAVENOUS

## 2022-02-01 MED ORDER — SUGAMMADEX SODIUM 200 MG/2ML IV SOLN
INTRAVENOUS | Status: DC | PRN
  Administered 2022-02-01: 200 mg via INTRAVENOUS

## 2022-02-01 MED ORDER — FAMOTIDINE 20 MG PO TABS
ORAL_TABLET | ORAL | Status: AC
  Administered 2022-02-01: 20 mg via ORAL
  Filled 2022-02-01: qty 1

## 2022-02-01 MED ORDER — ONDANSETRON HCL 4 MG/2ML IJ SOLN
INTRAMUSCULAR | Status: DC | PRN
  Administered 2022-02-01: 4 mg via INTRAVENOUS

## 2022-02-01 MED ORDER — CHLORHEXIDINE GLUCONATE 0.12 % MT SOLN: 15.0000 mL | Freq: Once | OROMUCOSAL | Status: AC

## 2022-02-01 MED ORDER — SODIUM CHLORIDE 0.9 % IV SOLN
INTRAVENOUS | Status: DC
Start: 2022-02-01 — End: 2022-02-01

## 2022-02-01 MED ORDER — ROCURONIUM BROMIDE 100 MG/10ML IV SOLN
INTRAVENOUS | Status: DC | PRN
  Administered 2022-02-01: 30 mg via INTRAVENOUS

## 2022-02-01 MED ORDER — FENTANYL CITRATE (PF) 100 MCG/2ML IJ SOLN
INTRAMUSCULAR | Status: AC
  Filled 2022-02-01: qty 2

## 2022-02-01 MED ORDER — EPHEDRINE SULFATE (PRESSORS) 50 MG/ML IJ SOLN
INTRAMUSCULAR | Status: DC | PRN
  Administered 2022-02-01: 10 mg via INTRAVENOUS

## 2022-02-01 MED ORDER — FAMOTIDINE 20 MG PO TABS: 20.0000 mg | ORAL_TABLET | Freq: Once | ORAL | Status: AC

## 2022-02-01 MED ORDER — ORAL CARE MOUTH RINSE: 15.0000 mL | Freq: Once | OROMUCOSAL | Status: AC

## 2022-02-01 MED ORDER — FENTANYL CITRATE (PF) 100 MCG/2ML IJ SOLN
INTRAMUSCULAR | Status: DC | PRN
  Administered 2022-02-01: 50 ug via INTRAVENOUS

## 2022-02-01 MED ORDER — DEXAMETHASONE SODIUM PHOSPHATE 10 MG/ML IJ SOLN
INTRAMUSCULAR | Status: AC
  Filled 2022-02-01: qty 1

## 2022-02-01 MED ORDER — LIDOCAINE HCL (CARDIAC) PF 100 MG/5ML IV SOSY
PREFILLED_SYRINGE | INTRAVENOUS | Status: DC | PRN
  Administered 2022-02-01: 80 mg via INTRAVENOUS

## 2022-02-01 MED ORDER — CIPROFLOXACIN IN D5W 400 MG/200ML IV SOLN
400.0000 mg | Freq: Once | INTRAVENOUS | Status: AC
  Administered 2022-02-01: 400 mg via INTRAVENOUS

## 2022-02-01 MED ORDER — LIDOCAINE HCL (PF) 2 % IJ SOLN
INTRAMUSCULAR | Status: AC
  Filled 2022-02-01: qty 5

## 2022-02-01 MED ORDER — EPHEDRINE 5 MG/ML INJ
INTRAVENOUS | Status: AC
  Filled 2022-02-01: qty 5

## 2022-02-01 MED ORDER — PHENYLEPHRINE HCL-NACL 20-0.9 MG/250ML-% IV SOLN
INTRAVENOUS | Status: DC | PRN
  Administered 2022-02-01: 45 ug/min via INTRAVENOUS

## 2022-02-01 MED ORDER — PROPOFOL 10 MG/ML IV BOLUS
INTRAVENOUS | Status: AC
  Filled 2022-02-01: qty 20

## 2022-02-01 MED ORDER — PHENYLEPHRINE 40 MCG/ML (10ML) SYRINGE FOR IV PUSH (FOR BLOOD PRESSURE SUPPORT)
PREFILLED_SYRINGE | INTRAVENOUS | Status: DC | PRN
  Administered 2022-02-01: 160 ug via INTRAVENOUS

## 2022-02-01 MED ORDER — DEXAMETHASONE SODIUM PHOSPHATE 10 MG/ML IJ SOLN
INTRAMUSCULAR | Status: DC | PRN
  Administered 2022-02-01: 5 mg via INTRAVENOUS

## 2022-02-01 MED ORDER — FENTANYL CITRATE (PF) 100 MCG/2ML IJ SOLN: 25.0000 ug | INTRAMUSCULAR | Status: DC | PRN

## 2022-02-01 MED ORDER — CEFAZOLIN SODIUM-DEXTROSE 2-4 GM/100ML-% IV SOLN
INTRAVENOUS | Status: AC
  Filled 2022-02-01: qty 100

## 2022-02-01 MED ORDER — CIPROFLOXACIN IN D5W 400 MG/200ML IV SOLN
INTRAVENOUS | Status: AC
  Filled 2022-02-01: qty 200

## 2022-02-01 MED ORDER — CHLORHEXIDINE GLUCONATE 0.12 % MT SOLN
OROMUCOSAL | Status: AC
  Administered 2022-02-01: 15 mL via OROMUCOSAL
  Filled 2022-02-01: qty 15

## 2022-02-01 MED ORDER — ACETAMINOPHEN 10 MG/ML IV SOLN: 1000.0000 mg | Freq: Once | INTRAVENOUS | Status: DC | PRN

## 2022-02-01 MED ORDER — PROPOFOL 10 MG/ML IV BOLUS
INTRAVENOUS | Status: DC | PRN
  Administered 2022-02-01: 130 mg via INTRAVENOUS

## 2022-02-01 MED ORDER — OXYCODONE HCL 5 MG PO TABS: 5.0000 mg | ORAL_TABLET | Freq: Once | ORAL | Status: DC | PRN

## 2022-02-01 MED ORDER — ROCURONIUM BROMIDE 10 MG/ML (PF) SYRINGE
PREFILLED_SYRINGE | INTRAVENOUS | Status: AC
  Filled 2022-02-01: qty 10

## 2022-02-01 MED ORDER — ONDANSETRON HCL 4 MG/2ML IJ SOLN: 4.0000 mg | Freq: Once | INTRAMUSCULAR | Status: DC | PRN

## 2022-02-01 MED ORDER — ONDANSETRON HCL 4 MG/2ML IJ SOLN
INTRAMUSCULAR | Status: AC
  Filled 2022-02-01: qty 2

## 2022-02-01 MED ORDER — OXYCODONE HCL 5 MG/5ML PO SOLN: 5.0000 mg | Freq: Once | ORAL | Status: DC | PRN

## 2022-02-01 MED ORDER — MIDAZOLAM HCL 2 MG/2ML IJ SOLN
INTRAMUSCULAR | Status: AC
  Filled 2022-02-01: qty 2

## 2022-02-01 SURGICAL SUPPLY — 36 items
ADAPTER IRRIG TUBE 2 SPIKE SOL (ADAPTER) ×4 IMPLANT
BAG URO DRAIN 4000ML (MISCELLANEOUS) ×2 IMPLANT
CATH FOLEY 3WAY 30CC 24FR (CATHETERS) ×1
CATH URETL OPEN END 4X70 (CATHETERS) ×2 IMPLANT
CATH URTH STD 24FR FL 3W 2 (CATHETERS) ×1 IMPLANT
CONTAINER COLLECT MORCELLATR (MISCELLANEOUS) ×1 IMPLANT
DRAPE UTILITY 15X26 TOWEL STRL (DRAPES) IMPLANT
ELECT BIVAP BIPO 22/24 DONUT (ELECTROSURGICAL)
ELECTRD BIVAP BIPO 22/24 DONUT (ELECTROSURGICAL) IMPLANT
FIBER LASER MOSES 550 DFL (Laser) ×1 IMPLANT
FILTER OVERFLOW MORCELLATOR (FILTER) ×1 IMPLANT
GAUZE 4X4 16PLY ~~LOC~~+RFID DBL (SPONGE) ×3 IMPLANT
GLOVE SURG UNDER POLY LF SZ7.5 (GLOVE) ×2 IMPLANT
GOWN STRL REUS W/ TWL LRG LVL3 (GOWN DISPOSABLE) ×1 IMPLANT
GOWN STRL REUS W/ TWL XL LVL3 (GOWN DISPOSABLE) ×1 IMPLANT
GOWN STRL REUS W/TWL LRG LVL3 (GOWN DISPOSABLE) ×1
GOWN STRL REUS W/TWL XL LVL3 (GOWN DISPOSABLE) ×1
HOLDER FOLEY CATH W/STRAP (MISCELLANEOUS) ×2 IMPLANT
IV NS IRRIG 3000ML ARTHROMATIC (IV SOLUTION) ×10 IMPLANT
KIT TURNOVER CYSTO (KITS) ×2 IMPLANT
MANIFOLD NEPTUNE II (INSTRUMENTS) IMPLANT
MBRN O SEALING YLW 17 FOR INST (MISCELLANEOUS) ×2
MEMBRANE SLNG YLW 17 FOR INST (MISCELLANEOUS) ×1 IMPLANT
MORCELLATOR COLLECT CONTAINER (MISCELLANEOUS) ×2
MORCELLATOR OVERFLOW FILTER (FILTER) ×2
MORCELLATOR ROTATION 4.75 335 (MISCELLANEOUS) ×2 IMPLANT
PACK CYSTO AR (MISCELLANEOUS) ×2 IMPLANT
SET CYSTO W/LG BORE CLAMP LF (SET/KITS/TRAYS/PACK) ×2 IMPLANT
SET IRRIG Y TYPE TUR BLADDER L (SET/KITS/TRAYS/PACK) ×2 IMPLANT
SLEEVE PROTECTION STRL DISP (MISCELLANEOUS) ×4 IMPLANT
SURGILUBE 2OZ TUBE FLIPTOP (MISCELLANEOUS) ×2 IMPLANT
SYR TOOMEY IRRIG 70ML (MISCELLANEOUS) ×2
SYRINGE TOOMEY IRRIG 70ML (MISCELLANEOUS) ×1 IMPLANT
TUBE PUMP MORCELLATOR PIRANHA (TUBING) ×2 IMPLANT
WATER STERILE IRR 1000ML POUR (IV SOLUTION) ×2 IMPLANT
WATER STERILE IRR 500ML POUR (IV SOLUTION) ×2 IMPLANT
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
## 2022-02-01 NOTE — Discharge Instructions (Addendum)
AMBULATORY SURGERY  ?DISCHARGE INSTRUCTIONS ? ? ?The drugs that you were given will stay in your system until tomorrow so for the next 24 hours you should not: ? ?Drive an automobile ?Make any legal decisions ?Drink any alcoholic beverage ? ? ?You may resume regular meals tomorrow.  Today it is better to start with liquids and gradually work up to solid foods. ? ?You may eat anything you prefer, but it is better to start with liquids, then soup and crackers, and gradually work up to solid foods. ? ? ?Please notify your doctor immediately if you have any unusual bleeding, trouble breathing, redness and pain at the surgery site, drainage, fever, or pain not relieved by medication. ? ? ? ?Additional Instructions: ? ? ? ?Please contact your physician with any problems or Same Day Surgery at [phone], Monday through Friday 6 am to 4 pm, or [facility] Main number at [phone].  ?

## 2022-02-01 NOTE — Op Note (Signed)
Date of procedure: 02/01/22 ? ?Preoperative diagnosis:  ?BPH and urinary retention ? ?Postoperative diagnosis:  ?Same ? ?Procedure: ?HoLEP (Holmium Laser Enucleation of the Prostate) ? ?Surgeon: Nickolas Madrid, MD ? ?Anesthesia: General ? ?Complications: None ? ?Intraoperative findings:  ?Small, short, and tight prostate with high bladder neck ?Uncomplicated HOLEP, ureteral orifices and verumontanum intact at conclusion of case, excellent hemostasis ? ?EBL: Minimal ? ?Specimens: Prostate chips ? ?Enucleation time: 22 minutes ? ?Morcellation time: 4 minutes ? ?Intra-op weight: 20 g ? ?Drains: 24 French three-way, 60 cc in balloon ? ?Indication: [name] is a 61 y.o. patient with numerous other medical problems and Foley dependent urinary retention.  He opted for HOLEP, and understands the risk of atonic/neurogenic bladder with possible persistent retention requiring Foley or suprapubic tube despite surgery.  After reviewing the management options for treatment, they elected to proceed with the above surgical procedure(s). We have discussed the potential benefits and risks of the procedure, side effects of the proposed treatment, the likelihood of the patient achieving the goals of the procedure, and any potential problems that might occur during the procedure or recuperation.  We specifically discussed the risks of bleeding, infection, hematuria and clot retention, need for additional procedures, possible overnight hospital stay, temporary urgency and incontinence, rare long-term incontinence, and retrograde ejaculation.  Informed consent has been obtained.  ? ?Description of procedure: ? ?The patient was taken to the operating room and general anesthesia was induced.  The patient was placed in the dorsal lithotomy position, prepped and draped in the usual sterile fashion, and preoperative antibiotics(Ancef and Cipro) were administered.  SCDs were placed for DVT prophylaxis.  A preoperative time-out was  performed.  ? ?[name] sounds were used to gently dilated the urethra up to 20F. The 46 French continuous flow resectoscope was inserted into the urethra using the visual obturator  The prostate was short, tight, and with a high bladder neck. The bladder was thoroughly inspected and notable for moderate trabeculations but no suspicious lesions.  The ureteral orifices were located in orthotopic position.  The laser was set to 2 J and 60 Hz and was used to make a lambda incision just proximal to the verumontanum down to the level of the capsule.  A 6 o'clock incision was then made down to the level of the capsule from the bladder neck to the verumontanum.  The lateral lobes were then incised circumferentially until they were disconnected from the surrounding tissue.  The capsule was examined and laser was used for meticulous hemostasis.  There was a wide open channel through the prostatic fossa at the conclusion of the procedure. ? ?The 64 French resectoscope was then switched out for the 72 French nephroscope and the lobes were morcellated and the tissue sent to pathology.  A 24 French three-way catheter was inserted easily with the aid of a catheter guide, and 60 cc were placed in the balloon.  Urine was pink.  The catheter irrigated easily with a Toomey syringe.  CBI was initiated. A belladonna suppository was placed. ? ?The patient tolerated the procedure well without any immediate complications and was extubated and transferred to the recovery room in stable condition.  Urine was clear on fast CBI. ? ?Disposition: Stable to PACU ? ?Plan: ?Wean CBI in PACU, anticipate discharge home today with voiding trial in clinic in 2-3 days(needs PM PVR-can also be performed at facility if they are able) ? ?Nickolas Madrid, MD ?02/01/2022 ? ?

## 2022-02-01 NOTE — Anesthesia Procedure Notes (Signed)
Procedure Name: Intubation ?Date/Time: 02/01/2022 12:54 PM ?Performed by: Loletha Grayer, CRNA ?Pre-anesthesia Checklist: Patient identified, Patient being monitored, Timeout performed, Emergency Drugs available and Suction available ?Patient Re-evaluated:Patient Re-evaluated prior to induction ?Oxygen Delivery Method: Circle system utilized ?Preoxygenation: Pre-oxygenation with 100% oxygen ?Induction Type: IV induction ?Ventilation: Mask ventilation without difficulty ?Laryngoscope Size: McGraph and 4 ?Grade View: Grade I ?Tube type: Oral ?Tube size: 7.0 mm ?Number of attempts: 1 ?Airway Equipment and Method: Stylet ?Placement Confirmation: ETT inserted through vocal cords under direct vision, positive ETCO2 and breath sounds checked- equal and bilateral ?Secured at: 22 cm ?Tube secured with: Tape ?Dental Injury: Teeth and Oropharynx as per pre-operative assessment  ? ? ? ? ?

## 2022-02-01 NOTE — Transfer of Care (Signed)
Immediate Anesthesia Transfer of Care Note ? ?Patient: [name] ? ?Procedure(s) Performed: HOLEP-LASER ENUCLEATION OF THE PROSTATE WITH MORCELLATION ? ?Patient Location: PACU ? ?Anesthesia Type:General ? ?Level of Consciousness: awake ? ?Airway & Oxygen Therapy: Patient Spontanous Breathing and Patient connected to face mask oxygen ? ?Post-op Assessment: Report given to RN and Post -op Vital signs reviewed and stable ? ?Post vital signs: Reviewed and stable ? ?Last Vitals:  ?Vitals Value Taken Time  ?BP 147/69 02/01/22 1401  ?Temp 36.1 ?C 02/01/22 1357  ?Pulse 69 02/01/22 1401  ?Resp 12 02/01/22 1401  ?SpO2 98 % 02/01/22 1401  ?Vitals shown include unvalidated device data. ? ?Last Pain:  ?Vitals:  ? 02/01/22 1357  ?TempSrc:   ?PainSc: Asleep  ?   ? ?  ? ?Complications: No notable events documented. ?
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
## 2022-02-02 NOTE — Anesthesia Postprocedure Evaluation (Signed)
Anesthesia Post Note ? ?Patient: [name] ? ?Procedure(s) Performed: HOLEP-LASER ENUCLEATION OF THE PROSTATE WITH MORCELLATION ? ?Patient location during evaluation: PACU ?Anesthesia Type: General ?Level of consciousness: awake and alert ?Pain management: pain level controlled ?Vital Signs Assessment: post-procedure vital signs reviewed and stable ?Respiratory status: spontaneous breathing, nonlabored ventilation, respiratory function stable and patient connected to nasal cannula oxygen ?Cardiovascular status: blood pressure returned to baseline and stable ?Postop Assessment: no apparent nausea or vomiting ?Anesthetic complications: no ? ?No notable events documented. ? ? ?Last Vitals:  ?Vitals:  ? 02/01/22 1445 02/01/22 1457  ?BP: 137/77 137/88  ?Pulse: 66 66  ?Resp: 10 14  ?Temp: 36.5 ?C (!) 36.2 ?C  ?SpO2: 96% 99%  ?  ?Last Pain:  ?Vitals:  ? 02/01/22 1457  ?TempSrc: Temporal  ?PainSc: 0-No pain  ? ? ?  ?  ?  ?  ?  ?  ? ?Arita Miss ? ? ? ? ?

## 2022-02-04 ENCOUNTER — Encounter: Payer: Self-pay | Admitting: Urology

## 2022-02-04 LAB — SURGICAL PATHOLOGY

## 2022-02-06 ENCOUNTER — Ambulatory Visit: Payer: Medicaid Other | Admitting: Urology

## 2022-02-06 ENCOUNTER — Encounter: Payer: Self-pay | Admitting: Urology

## 2022-02-06 ENCOUNTER — Ambulatory Visit (INDEPENDENT_AMBULATORY_CARE_PROVIDER_SITE_OTHER): Payer: Medicaid Other | Admitting: Urology

## 2022-02-06 ENCOUNTER — Other Ambulatory Visit: Payer: Self-pay

## 2022-02-06 DIAGNOSIS — N401 Enlarged prostate with lower urinary tract symptoms: Secondary | ICD-10-CM

## 2022-02-06 DIAGNOSIS — N138 Other obstructive and reflux uropathy: Secondary | ICD-10-CM

## 2022-02-06 LAB — BLADDER SCAN AMB NON-IMAGING
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
## 2022-02-07 ENCOUNTER — Ambulatory Visit (INDEPENDENT_AMBULATORY_CARE_PROVIDER_SITE_OTHER): Payer: Medicaid Other | Admitting: Urology

## 2022-02-07 DIAGNOSIS — N401 Enlarged prostate with lower urinary tract symptoms: Secondary | ICD-10-CM | POA: Diagnosis not present

## 2022-02-07 DIAGNOSIS — N138 Other obstructive and reflux uropathy: Secondary | ICD-10-CM | POA: Diagnosis not present

## 2022-02-07 DIAGNOSIS — R339 Retention of urine, unspecified: Secondary | ICD-10-CM

## 2022-02-07 LAB — BLADDER SCAN AMB NON-IMAGING
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
## 2022-02-12 ENCOUNTER — Encounter: Payer: Self-pay | Admitting: Urology

## 2022-03-28 ENCOUNTER — Ambulatory Visit (INDEPENDENT_AMBULATORY_CARE_PROVIDER_SITE_OTHER): Payer: Medicaid Other | Admitting: Urology

## 2022-03-28 ENCOUNTER — Encounter: Payer: Self-pay | Admitting: Urology

## 2022-03-28 VITALS — BP 142/86 | HR 72

## 2022-03-28 DIAGNOSIS — Z87898 Personal history of other specified conditions: Secondary | ICD-10-CM

## 2022-03-28 DIAGNOSIS — R339 Retention of urine, unspecified: Secondary | ICD-10-CM

## 2022-03-28 DIAGNOSIS — N400 Enlarged prostate without lower urinary tract symptoms: Secondary | ICD-10-CM

## 2022-03-28 LAB — BLADDER SCAN AMB NON-IMAGING

## 2022-03-28 NOTE — Progress Notes (Signed)
? ?  03/28/2022 ?2:20 PM  ? ?[name] ?24-Apr-1961 ?[id number] ? ?Reason for visit: Follow up BPH/retention s/p HoLEP ? ?HPI: ?Extremely comorbid 61 year old male with Foley dependent urinary retention who failed multiple voiding trials, and prostate measured 22 g on CT with no other abnormality seen on cystoscopy.  We reviewed possible etiologies including atonic/neurogenic bladder, or BPH with outlet obstruction. He opted for HOLEP to give him the best chance to resume voiding spontaneously and avoid chronic foley/SP tube. ? ?He underwent uncomplicated HOLEP on 3/41/9622 with removal 18 g of benign prostate tissue.  He passed a voiding trial postop with a PVR of 200 mL but was voiding spontaneously. ? ?He continues to do well and is urinating spontaneously with a good stream.  He denies any problems with urination.  He is not having any incontinence.  PVR is 242ml which is reasonable with his likely longstanding obstruction and component of atonic bladder.  I recommended continuing timed voiding. ? ?-RTC 71mo PVR ?-Would avoid Foley catheter unless develops true retention or significantly elevated PVRs with upstream hydronephrosis that could contribute to worsening renal function ? ? ?Billey Co, MD ? ?[facility] ?[address] ?[place], [place] [address] ?[phone] ? ?

## 2022-04-29 ENCOUNTER — Ambulatory Visit (INDEPENDENT_AMBULATORY_CARE_PROVIDER_SITE_OTHER): Payer: Medicaid Other | Admitting: Vascular Surgery

## 2022-04-29 ENCOUNTER — Encounter (INDEPENDENT_AMBULATORY_CARE_PROVIDER_SITE_OTHER): Payer: Self-pay | Admitting: Vascular Surgery

## 2022-04-29 VITALS — BP 143/76 | HR 72 | Resp 16

## 2022-04-29 DIAGNOSIS — K219 Gastro-esophageal reflux disease without esophagitis: Secondary | ICD-10-CM

## 2022-04-29 DIAGNOSIS — N185 Chronic kidney disease, stage 5: Secondary | ICD-10-CM | POA: Diagnosis not present

## 2022-04-29 DIAGNOSIS — I10 Essential (primary) hypertension: Secondary | ICD-10-CM

## 2022-04-29 DIAGNOSIS — E1121 Type 2 diabetes mellitus with diabetic nephropathy: Secondary | ICD-10-CM

## 2022-04-29 DIAGNOSIS — I2510 Atherosclerotic heart disease of native coronary artery without angina pectoris: Secondary | ICD-10-CM | POA: Diagnosis not present
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
## 2022-04-30 ENCOUNTER — Encounter (INDEPENDENT_AMBULATORY_CARE_PROVIDER_SITE_OTHER): Payer: Self-pay | Admitting: Vascular Surgery

## 2023-01-02 ENCOUNTER — Ambulatory Visit (INDEPENDENT_AMBULATORY_CARE_PROVIDER_SITE_OTHER): Payer: Medicaid Other | Admitting: Urology

## 2023-01-02 ENCOUNTER — Encounter: Payer: Self-pay | Admitting: Urology

## 2023-01-02 VITALS — BP 150/84 | HR 79 | Ht 72.0 in | Wt 155.0 lb

## 2023-01-02 DIAGNOSIS — N401 Enlarged prostate with lower urinary tract symptoms: Secondary | ICD-10-CM | POA: Diagnosis not present

## 2023-01-02 DIAGNOSIS — Z87898 Personal history of other specified conditions: Secondary | ICD-10-CM

## 2023-01-02 DIAGNOSIS — R339 Retention of urine, unspecified: Secondary | ICD-10-CM | POA: Diagnosis not present

## 2023-01-02 LAB — BLADDER SCAN AMB NON-IMAGING

## 2023-01-02 NOTE — Progress Notes (Signed)
   01/02/2023 2:56 PM   [name] 07/05/1961 [id number]  Reason for visit: Follow up BPH/retention s/p HoLEP  HPI: Extremely comorbid 62 year old male with Foley dependent urinary retention who failed multiple voiding trials, and prostate measured 22 g on CT with no other abnormality seen on cystoscopy.  We reviewed possible etiologies including atonic/neurogenic bladder, or BPH with outlet obstruction. He opted for HoLEP to give him the best chance to resume voiding spontaneously and avoid chronic foley/SP tube.  He underwent uncomplicated HOLEP on 6/54/6503 with removal 18 g of benign prostate tissue.  He passed a voiding trial postop with a PVR of 200 mL but was voiding spontaneously.  He continues to do well and is urinating spontaneously with a good stream.  He denies any problems with urination.  He is not having any incontinence.  PVR is 241ml which is reasonable with his likely longstanding obstruction and component of atonic bladder, and is stable from prior post-op values.  I recommended continuing timed voiding.  -RTC 1 year PVR -Would avoid Foley catheter unless develops true retention or significantly elevated PVRs with upstream hydronephrosis that could contribute to worsening renal function   Billey Co, MD  [place] [address] [place], [place] [address] [phone]

## 2023-03-22 IMAGING — CT CT HEAD W/O CM
4 of 6 series · 16 of 47 positions shown, 18 images · non-contrast
Comparison: Maxillofacial CT 07/04/2011

CLINICAL DATA: Acute neurological deficit, stroke suspected. Fever
and altered mental status.

EXAM:
CT HEAD WITHOUT CONTRAST
TECHNIQUE: Contiguous axial images were obtained from the base of the skull
through the vertex without intravenous contrast.

[Series 2: head bone · axial · 0.50mm/px · z∈[+445,+515]mm · 5 of 80 slices shown]
[im 9/80  bone]
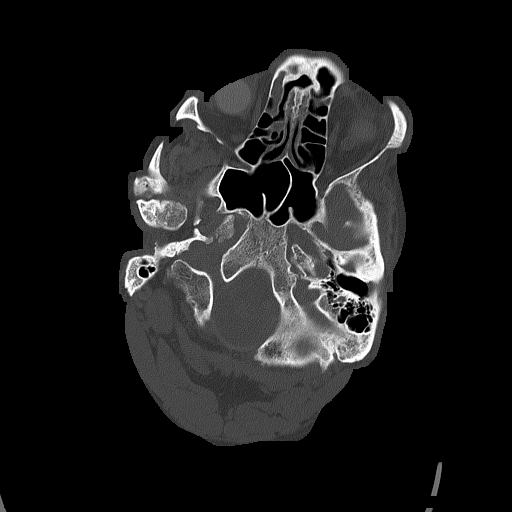
[im 18/80  bone]
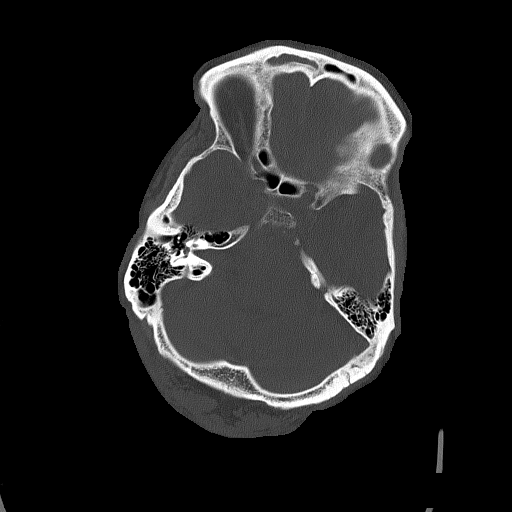
[im 27/80  bone]
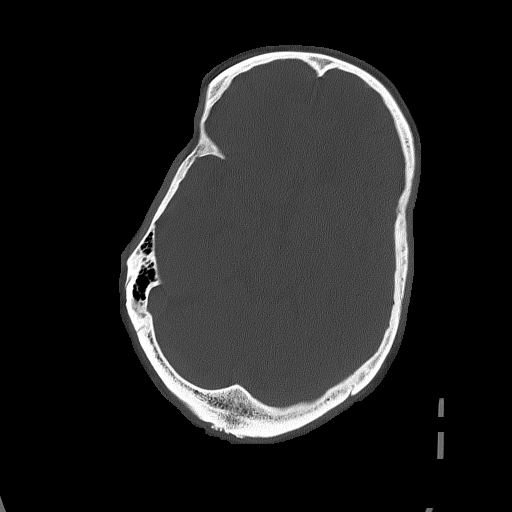
[im 36/80  bone]
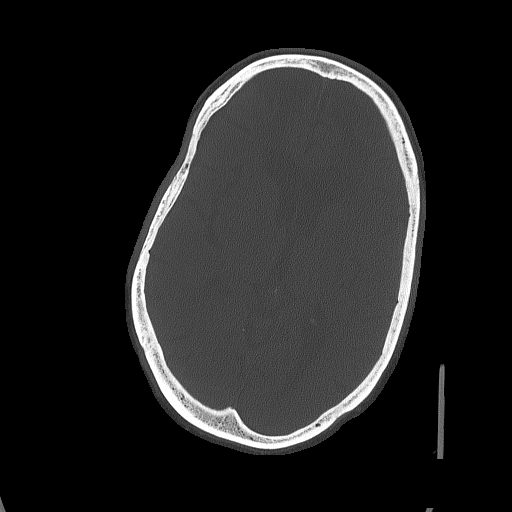
[im 44/80  bone]
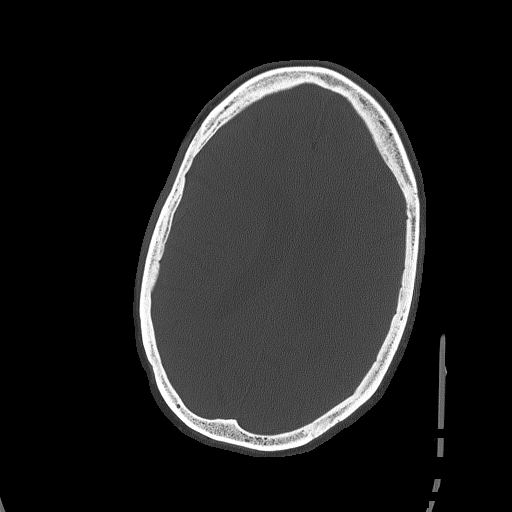

[Series 3: head wo · axial · 0.50mm/px · z∈[+454,+554]mm · 5 of 32 slices shown, 7 images]
[im 6/32  brain]
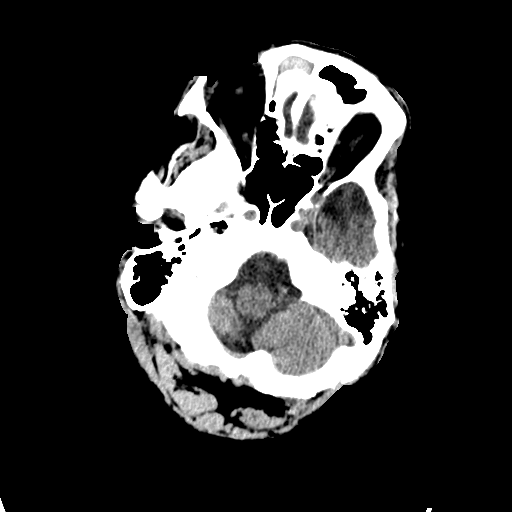
[im 6/32  bone]
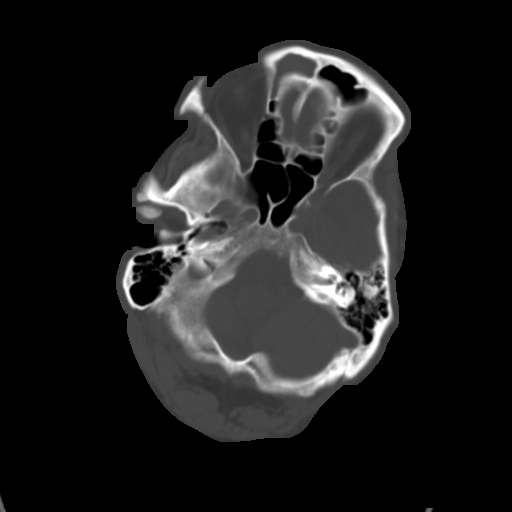
[im 11/32  brain]
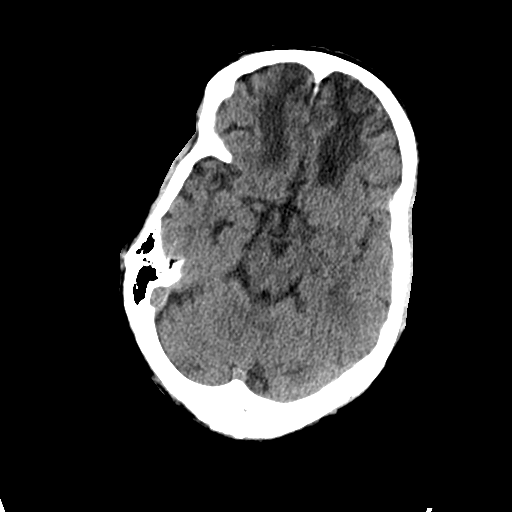
[im 16/32  brain]
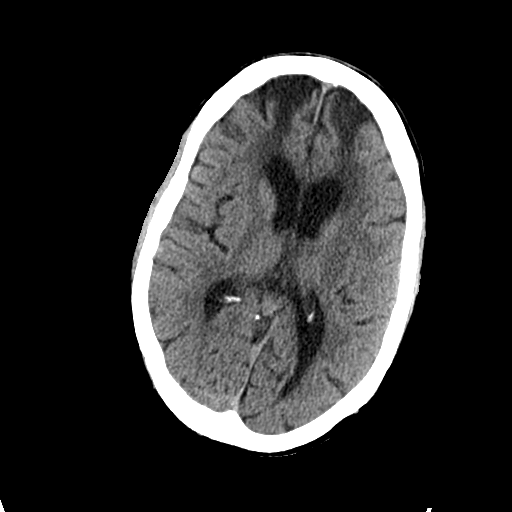
[im 21/32  brain]
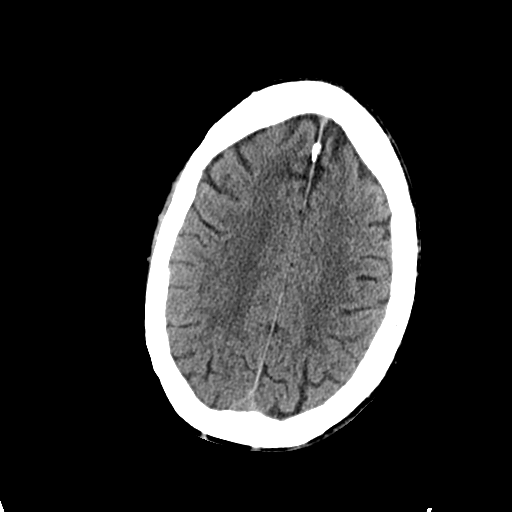
[im 26/32  brain]
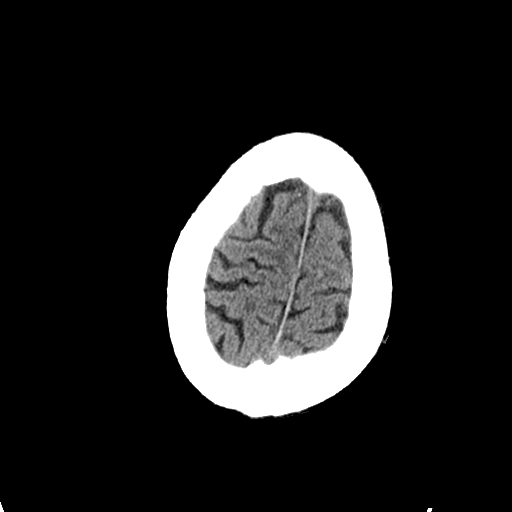
[im 26/32  bone]
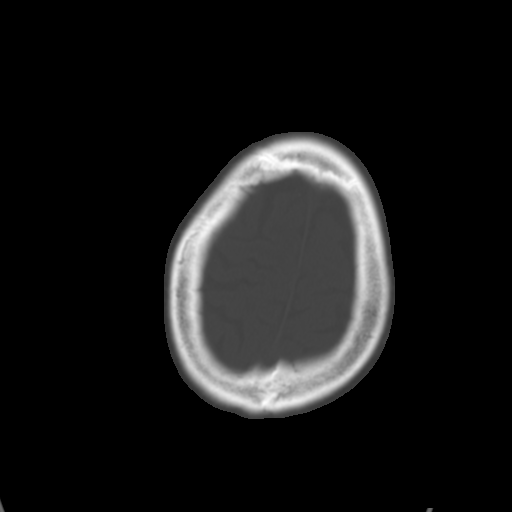

[Series 4: coronal soft tissue · coronal · 0.34mm/px · 3 of 71 slices shown]
[im 24/71  brain]
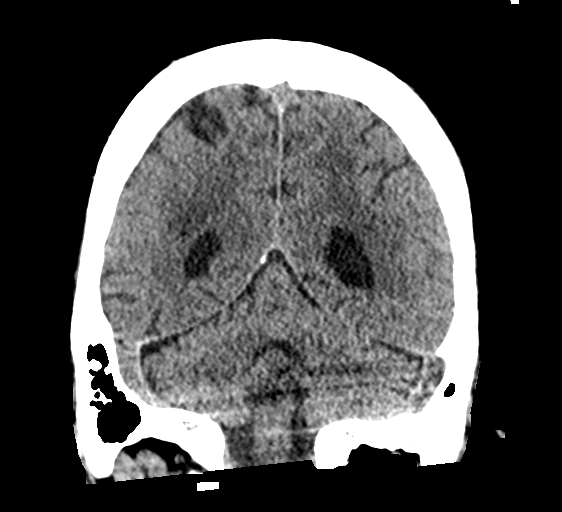
[im 32/71  brain]
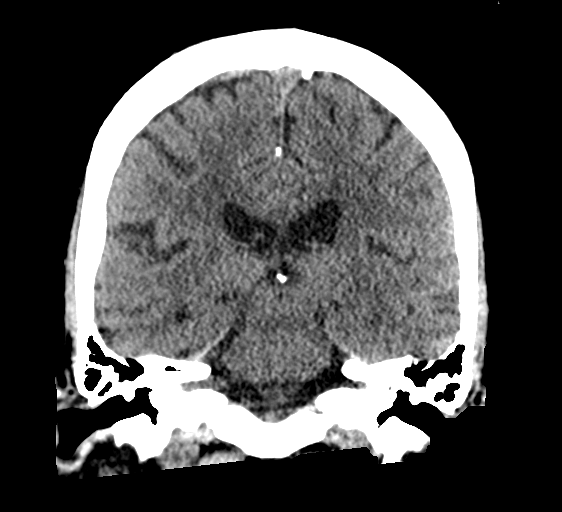
[im 39/71  brain]
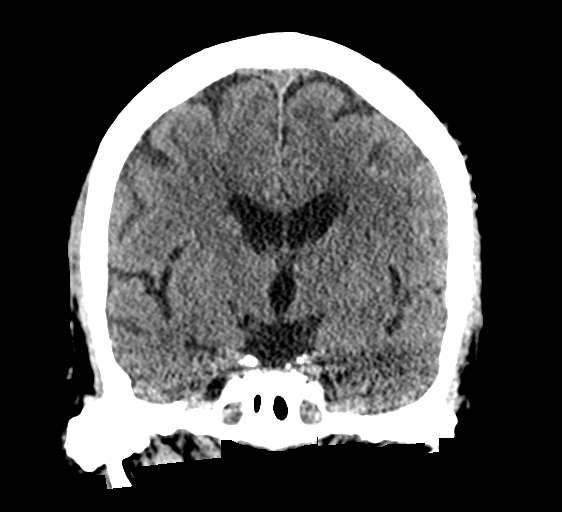

[Series 5: sagittal soft tissue · sagittal · 0.34mm/px · 3 of 53 slices shown]
[im 19/53  brain]
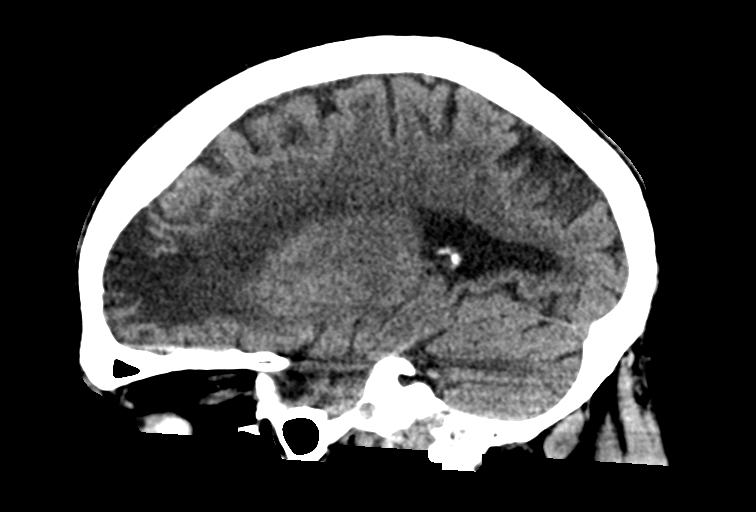
[im 27/53  brain]
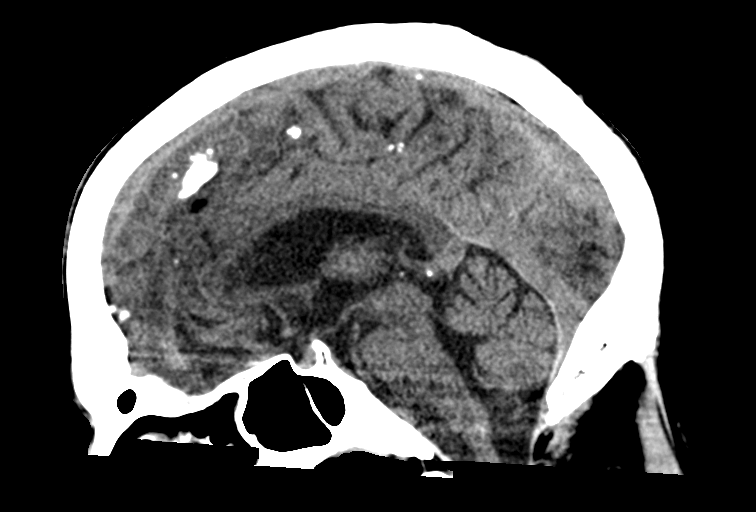
[im 34/53  brain]
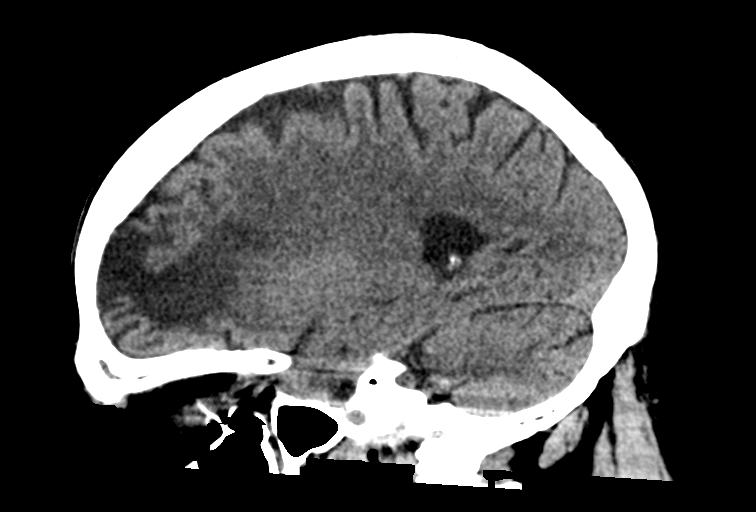

[16 of 47 positions shown; findings below may reference images not displayed]

FINDINGS: Brain: Mild diffuse cerebral atrophy. Mild ventricular dilatation
likely due to central atrophy. Low-attenuation changes in the deep
white matter consistent small vessel ischemia. Areas of
encephalomalacia in the frontal lobes bilaterally. This is likely
due to old infarct or injury and is unchanged since prior study. No
mass-effect or midline shift. No abnormal extra-axial fluid
collections. Gray-white matter junctions are distinct. Basal
cisterns are not effaced. No acute intracranial hemorrhage.

Vascular: Mild intracranial arterial vascular calcifications.

Skull: Calvarium appears intact.

Sinuses/Orbits: Mucosal thickening in the paranasal sinuses.
Bilateral ocular prostheses.

Other: Congenital nonunion of the posterior arch of C1.
IMPRESSION: No acute intracranial abnormalities. Chronic atrophy and small
vessel ischemic changes. Old encephalomalacia in the frontal lobes.

## 2023-12-21 ENCOUNTER — Emergency Department: Payer: Medicaid Other

## 2023-12-21 ENCOUNTER — Observation Stay
Admission: EM | Admit: 2023-12-21 | Discharge: 2023-12-23 | Disposition: A | Discharge status: Skilled Nursing Facility | Payer: Medicaid Other | Attending: Internal Medicine | Admitting: Internal Medicine

## 2023-12-21 DIAGNOSIS — F17210 Nicotine dependence, cigarettes, uncomplicated: Secondary | ICD-10-CM | POA: Diagnosis not present

## 2023-12-21 DIAGNOSIS — E1122 Type 2 diabetes mellitus with diabetic chronic kidney disease: Secondary | ICD-10-CM | POA: Diagnosis not present

## 2023-12-21 DIAGNOSIS — E1142 Type 2 diabetes mellitus with diabetic polyneuropathy: Secondary | ICD-10-CM | POA: Diagnosis not present

## 2023-12-21 DIAGNOSIS — Z79899 Other long term (current) drug therapy: Secondary | ICD-10-CM | POA: Insufficient documentation

## 2023-12-21 DIAGNOSIS — I509 Heart failure, unspecified: Secondary | ICD-10-CM | POA: Diagnosis not present

## 2023-12-21 DIAGNOSIS — I160 Hypertensive urgency: Secondary | ICD-10-CM | POA: Diagnosis not present

## 2023-12-21 DIAGNOSIS — I130 Hypertensive heart and chronic kidney disease with heart failure and stage 1 through stage 4 chronic kidney disease, or unspecified chronic kidney disease: Secondary | ICD-10-CM | POA: Diagnosis not present

## 2023-12-21 DIAGNOSIS — R202 Paresthesia of skin: Principal | ICD-10-CM

## 2023-12-21 DIAGNOSIS — I639 Cerebral infarction, unspecified: Secondary | ICD-10-CM | POA: Diagnosis present

## 2023-12-21 DIAGNOSIS — Z23 Encounter for immunization: Secondary | ICD-10-CM | POA: Insufficient documentation

## 2023-12-21 DIAGNOSIS — R1310 Dysphagia, unspecified: Secondary | ICD-10-CM | POA: Insufficient documentation

## 2023-12-21 DIAGNOSIS — D631 Anemia in chronic kidney disease: Secondary | ICD-10-CM | POA: Diagnosis not present

## 2023-12-21 DIAGNOSIS — I6389 Other cerebral infarction: Principal | ICD-10-CM | POA: Insufficient documentation

## 2023-12-21 DIAGNOSIS — E785 Hyperlipidemia, unspecified: Secondary | ICD-10-CM

## 2023-12-21 DIAGNOSIS — G629 Polyneuropathy, unspecified: Secondary | ICD-10-CM

## 2023-12-21 DIAGNOSIS — N184 Chronic kidney disease, stage 4 (severe): Secondary | ICD-10-CM | POA: Diagnosis not present

## 2023-12-21 LAB — CBC
HCT: 36.1 % — ABNORMAL LOW (ref 39.0–52.0)
Hemoglobin: 11.7 g/dL — ABNORMAL LOW (ref 13.0–17.0)
MCH: 27.6 pg (ref 26.0–34.0)
MCHC: 32.4 g/dL (ref 30.0–36.0)
MCV: 85.1 fL (ref 80.0–100.0)
Platelets: 244 10*3/uL (ref 150–400)
RBC: 4.24 MIL/uL (ref 4.22–5.81)
RDW: 13.3 % (ref 11.5–15.5)
WBC: 9.3 10*3/uL (ref 4.0–10.5)
nRBC: 0 % (ref 0.0–0.2)

## 2023-12-21 LAB — DIFFERENTIAL
Abs Immature Granulocytes: 0.06 10*3/uL (ref 0.00–0.07)
Basophils Absolute: 0 10*3/uL (ref 0.0–0.1)
Basophils Relative: 0 %
Eosinophils Absolute: 0.1 10*3/uL (ref 0.0–0.5)
Eosinophils Relative: 1 %
Immature Granulocytes: 1 %
Lymphocytes Relative: 15 %
Lymphs Abs: 1.3 10*3/uL (ref 0.7–4.0)
Monocytes Absolute: 1 10*3/uL (ref 0.1–1.0)
Monocytes Relative: 10 %
Neutro Abs: 6.7 10*3/uL (ref 1.7–7.7)
Neutrophils Relative %: 73 %

## 2023-12-21 LAB — COMPREHENSIVE METABOLIC PANEL
ALT: 19 U/L (ref 0–44)
AST: 22 U/L (ref 15–41)
Albumin: 3.8 g/dL (ref 3.5–5.0)
Alkaline Phosphatase: 56 U/L (ref 38–126)
Anion gap: 14 (ref 5–15)
BUN: 62 mg/dL — ABNORMAL HIGH (ref 8–23)
CO2: 22 mmol/L (ref 22–32)
Calcium: 9.4 mg/dL (ref 8.9–10.3)
Chloride: 99 mmol/L (ref 98–111)
Creatinine, Ser: 3.68 mg/dL — ABNORMAL HIGH (ref 0.61–1.24)
GFR, Estimated: 18 mL/min — ABNORMAL LOW (ref >60)
Glucose, Bld: 199 mg/dL — ABNORMAL HIGH (ref 70–99)
Potassium: 5.1 mmol/L (ref 3.5–5.1)
Sodium: 135 mmol/L (ref 135–145)
Total Bilirubin: 0.5 mg/dL (ref 0.0–1.2)
Total Protein: 7.5 g/dL (ref 6.5–8.1)

## 2023-12-21 LAB — ETHANOL: Alcohol, Ethyl (B): <10 mg/dL (ref <10)

## 2023-12-21 LAB — TROPONIN I (HIGH SENSITIVITY)
Troponin I (High Sensitivity): 18 ng/L — ABNORMAL HIGH (ref <18)
Troponin I (High Sensitivity): 23 ng/L — ABNORMAL HIGH (ref <18)

## 2023-12-21 LAB — PROTIME-INR
INR: 1 (ref 0.8–1.2)
Prothrombin Time: 13.4 s (ref 11.4–15.2)

## 2023-12-21 LAB — APTT: aPTT: 27 s (ref 24–36)

## 2023-12-21 MED ORDER — SODIUM CHLORIDE 0.9% FLUSH: 3.0000 mL | Freq: Once | INTRAVENOUS | Status: DC

## 2023-12-21 NOTE — ED Notes (Signed)
Patient placed on cardiac leads.
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
## 2023-12-21 NOTE — ED Provider Triage Note (Signed)
Emergency Medicine Provider Triage Evaluation Note  [name] , a 63 y.o. male  was evaluated in triage.  Pt complains of left side weakness x 3 days with tingling to face, left arm, and left leg.  Physical Exam  BP (!) 150/92 (BP Location: Left Arm)   Pulse 74   Temp 98.3 F (36.8 C) (Oral)   Resp 20   Ht 6' (1.829 m)   Wt 70.3 kg   SpO2 97%   BMI 21.02 kg/m  Gen:   Awake, no distress   Resp:  Normal effort  MSK:   Moves extremities without difficulty  Other:    Medical Decision Making  Medically screening exam initiated at 3:46 PM.  Appropriate orders placed.  [name] was informed that the remainder of the evaluation will be completed by another provider, this initial triage assessment does not replace that evaluation, and the importance of remaining in the ED until their evaluation is complete.    Chinita Pester, FNP 12/21/23 2137

## 2023-12-21 NOTE — ED Provider Notes (Addendum)
[facility] Provider Note    Event Date/Time   First MD Initiated Contact with Patient 12/21/23 2050     (approximate)   History   Numbness   HPI  [name] is a 63 y.o. male who comes in with left-sided face, left arm and left leg numbness for the past 3 days.   Patient states that he is concerned that he might of had another stroke.  He does report some weakness on that side as well. .  I reviewed the note from neurology on 1/14 where patient's seen over there for chronic foot pain chronic tremors.  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  He states that today he did have a fall.  However he states that the symptoms started before then.     Physical Exam   Triage Vital Signs: ED Triage Vitals  Encounter Vitals Group     BP 12/21/23 1515 (!) 150/92     Systolic BP Percentile --      Diastolic BP Percentile --      Pulse Rate 12/21/23 1515 74     Resp 12/21/23 1515 20     Temp 12/21/23 1516 98.3 F (36.8 C)     Temp Source 12/21/23 1516 Oral     SpO2 12/21/23 1515 97 %     Weight 12/21/23 1530 155 lb (70.3 kg)     Height 12/21/23 1530 6' (1.829 m)     Head Circumference --      [facility] Flow --      Pain Score 12/21/23 1529 0     Pain Loc --      Pain Education --      Exclude from Growth Chart --     Most recent vital signs: Vitals:   12/21/23 1516 12/21/23 1842  BP:  (!) 149/76  Pulse:  85  Resp:  18  Temp: 98.3 F (36.8 C)   SpO2:  96%     General: Awake, no distress.  CV:  Good peripheral perfusion.  Resp:  Normal effort.  Abd:  No distention.  Other:  Patient has contracture noted of his left arm.  He is got sensation changes on the left Patient was irritable when I asked him to try to raise his leg.  He stated that his shoe was on.  I removed patient's shoe and he was able to slightly lift leg up off the bed on both sides.  He reports some subjective changes on his left side including his left  face.  ED Results / Procedures / Treatments   Labs (all labs ordered are listed, but only abnormal results are displayed) Labs Reviewed  CBC - Abnormal; Notable for the following components:      Result Value   Hemoglobin 11.7 (*)    HCT 36.1 (*)    All other components within normal limits  COMPREHENSIVE METABOLIC PANEL - Abnormal; Notable for the following components:   Glucose, Bld 199 (*)    BUN 62 (*)    Creatinine, Ser 3.68 (*)    GFR, Estimated 18 (*)    All other components within normal limits  PROTIME-INR  APTT  DIFFERENTIAL  ETHANOL     EKG  My interpretation of EKG:  Normal sinus rate of 82 without any ST elevation or T wave inversions, normal intervals  RADIOLOGY I have reviewed the CT head personally interpreted no evidence of intercranial hemorrhage  PROCEDURES:  Critical Care performed: No  Procedures   MEDICATIONS ORDERED IN ED: Medications  sodium chloride flush (NS) 0.9 % injection 3 mL (3 mLs Intravenous Not Given 12/21/23 1835)     IMPRESSION / MDM / ASSESSMENT AND PLAN / ED COURSE  I reviewed the triage vital signs and the nursing notes.   Patient's presentation is most consistent with acute presentation with potential threat to life or bodily function.   Patient comes in with left arm numbness, tingling.  CT head to evaluate for intracranial hemorrhage.  Labs ordered evaluate for Electra abnormalities.  Patient's coags are normal CBC shows stable hemoglobin CMP shows stably elevated creatinine.  Stroke code was not called given out of the window.  Patient reported some vomiting earlier but his abdomen is soft and nontender.  EKG, cardiac marker ordered just to evaluate for ACS.  He denies any significant chest pain to suggest dissection.  Ethanol is negative coags normal CBC shows hemoglobin around baseline.  Kidney function is elevated but similar to prior  CT imaging is negative for acute processes will get MRI of head and spine given  CT cervical was not ordered and he did report a fall and having some neck pain after the fall.  Patient be handed off pending MRI, troponin.  Reevaluated patient prior to handoff and he is denying any pain.  The patient is on the cardiac monitor to evaluate for evidence of arrhythmia and/or significant heart rate changes.      FINAL CLINICAL IMPRESSION(S) / ED DIAGNOSES   Final diagnoses:  Tingling     Rx / DC Orders   ED Discharge Orders     None        Note:  This document was prepared using Dragon voice recognition software and may include unintentional dictation errors.   Concha Se, MD 12/21/23 2229    Concha Se, MD 12/21/23 [phone]

## 2023-12-21 NOTE — ED Notes (Signed)
This Registered Nurse (RN) assumed responsibility for the care of the assigned patient at 1900 on 12/21/23. All nursing tasks, documentation, and responsibilities prior to this time are not the responsibility of this RN. Any assessments, interventions, or documentation completed before this time are not within the scope of this RN's duties.  Effective 1900 on 12/21/23, this RN is now accountable for all aspects of patient care, including but not limited to assessments, interventions, documentation, medication administration, and care coordination. All future tasks and documentation will be managed and completed by this RN from this time until care handoff at 0700 on 12/22/23.   At this time, NIH scale unable to be completed due to patient disability limitations at baseline. Pt is blind, contracted, and unable to perform tests within scale for accurate assessment.

## 2023-12-21 NOTE — ED Triage Notes (Signed)
First NurseNote;  Pt via [facility] from [facility]. Reports L sided weakness since Thursday. Pt was leaning forward and fell forwards. Denies any pain. Pt has a hx of developmental  157/84 BP 86 HR 99%

## 2023-12-21 NOTE — ED Notes (Signed)
Patient is alert, verbal with clear speech. Patient has a blanket over his head. Patient is wearing dark glasses and is vision impaired. Patient states left arm cannot be used for IVs or blood pressure. Blue sock was placed on right foot per request. Patient  had some "skin removed" the other day and is painful.

## 2023-12-22 ENCOUNTER — Other Ambulatory Visit: Payer: Self-pay

## 2023-12-22 ENCOUNTER — Observation Stay: Payer: Medicaid Other

## 2023-12-22 ENCOUNTER — Observation Stay
Admit: 2023-12-22 | Discharge: 2023-12-22 | Disposition: A | Discharge status: Home / Self Care | Payer: Medicaid Other | Attending: Family Medicine

## 2023-12-22 DIAGNOSIS — I160 Hypertensive urgency: Secondary | ICD-10-CM | POA: Diagnosis not present

## 2023-12-22 DIAGNOSIS — I639 Cerebral infarction, unspecified: Secondary | ICD-10-CM | POA: Diagnosis present

## 2023-12-22 DIAGNOSIS — E785 Hyperlipidemia, unspecified: Secondary | ICD-10-CM | POA: Diagnosis not present

## 2023-12-22 DIAGNOSIS — R200 Anesthesia of skin: Secondary | ICD-10-CM | POA: Diagnosis not present

## 2023-12-22 DIAGNOSIS — G629 Polyneuropathy, unspecified: Secondary | ICD-10-CM

## 2023-12-22 LAB — LIPID PANEL
Cholesterol: 109 mg/dL (ref 0–200)
HDL: 33 mg/dL — ABNORMAL LOW (ref >40)
LDL Cholesterol: 62 mg/dL (ref 0–99)
Total CHOL/HDL Ratio: 3.3 {ratio}
Triglycerides: 70 mg/dL (ref <150)
VLDL: 14 mg/dL (ref 0–40)

## 2023-12-22 LAB — ECHOCARDIOGRAM COMPLETE BUBBLE STUDY
AR max vel: 2.61 cm2
AV Area VTI: 2.89 cm2
AV Area mean vel: 2.5 cm2
AV Mean grad: 3 mm[Hg]
AV Peak grad: 4.8 mm[Hg]
Ao pk vel: 1.1 m/s
Area-P 1/2: 3.27 cm2
S' Lateral: 2.8 cm

## 2023-12-22 LAB — BASIC METABOLIC PANEL
Anion gap: 9 (ref 5–15)
BUN: 62 mg/dL — ABNORMAL HIGH (ref 8–23)
CO2: 22 mmol/L (ref 22–32)
Calcium: 9.1 mg/dL (ref 8.9–10.3)
Chloride: 107 mmol/L (ref 98–111)
Creatinine, Ser: 3.7 mg/dL — ABNORMAL HIGH (ref 0.61–1.24)
GFR, Estimated: 17 mL/min — ABNORMAL LOW (ref >60)
Glucose, Bld: 129 mg/dL — ABNORMAL HIGH (ref 70–99)
Potassium: 5 mmol/L (ref 3.5–5.1)
Sodium: 138 mmol/L (ref 135–145)

## 2023-12-22 LAB — CBC
HCT: 34.3 % — ABNORMAL LOW (ref 39.0–52.0)
Hemoglobin: 11.1 g/dL — ABNORMAL LOW (ref 13.0–17.0)
MCH: 27.5 pg (ref 26.0–34.0)
MCHC: 32.4 g/dL (ref 30.0–36.0)
MCV: 85.1 fL (ref 80.0–100.0)
Platelets: 205 10*3/uL (ref 150–400)
RBC: 4.03 MIL/uL — ABNORMAL LOW (ref 4.22–5.81)
RDW: 13.3 % (ref 11.5–15.5)
WBC: 7.6 10*3/uL (ref 4.0–10.5)
nRBC: 0 % (ref 0.0–0.2)

## 2023-12-22 LAB — HIV ANTIBODY (ROUTINE TESTING W REFLEX): HIV Screen 4th Generation wRfx: NONREACTIVE

## 2023-12-22 MED ORDER — ACETAMINOPHEN 325 MG PO TABS: 650.0000 mg | ORAL_TABLET | Freq: Four times a day (QID) | ORAL | Status: DC | PRN

## 2023-12-22 MED ORDER — STROKE: EARLY STAGES OF RECOVERY BOOK: Freq: Once | Status: AC

## 2023-12-22 MED ORDER — INFLUENZA VIRUS VACC SPLIT PF (FLUZONE) 0.5 ML IM SUSY
0.5000 mL | PREFILLED_SYRINGE | INTRAMUSCULAR | Status: AC
  Administered 2023-12-23: 0.5 mL via INTRAMUSCULAR
  Filled 2023-12-22: qty 0.5

## 2023-12-22 MED ORDER — ENOXAPARIN SODIUM 30 MG/0.3ML IJ SOSY
30.0000 mg | PREFILLED_SYRINGE | INTRAMUSCULAR | Status: DC
Start: 2023-12-22 — End: 2023-12-23
  Administered 2023-12-22 – 2023-12-23 (×2): 30 mg via SUBCUTANEOUS
  Filled 2023-12-22 (×2): qty 0.3

## 2023-12-22 MED ORDER — MAGNESIUM HYDROXIDE 400 MG/5ML PO SUSP: 30.0000 mL | Freq: Every day | ORAL | Status: DC | PRN

## 2023-12-22 MED ORDER — PRIMIDONE 50 MG PO TABS
50.0000 mg | ORAL_TABLET | Freq: Two times a day (BID) | ORAL | Status: DC
  Administered 2023-12-22 – 2023-12-23 (×2): 50 mg via ORAL
  Filled 2023-12-22 (×5): qty 1

## 2023-12-22 MED ORDER — OXYCODONE HCL ER 10 MG PO T12A
10.0000 mg | EXTENDED_RELEASE_TABLET | Freq: Every day | ORAL | Status: DC
  Administered 2023-12-22 (×2): 10 mg via ORAL
  Filled 2023-12-22 (×2): qty 1

## 2023-12-22 MED ORDER — ADULT MULTIVITAMIN W/MINERALS CH
1.0000 | ORAL_TABLET | Freq: Every day | ORAL | Status: DC
  Administered 2023-12-22 – 2023-12-23 (×2): 1 via ORAL
  Filled 2023-12-22 (×3): qty 1

## 2023-12-22 MED ORDER — SENNOSIDES-DOCUSATE SODIUM 8.6-50 MG PO TABS
1.0000 | ORAL_TABLET | Freq: Two times a day (BID) | ORAL | Status: DC
  Administered 2023-12-22 – 2023-12-23 (×3): 1 via ORAL
  Filled 2023-12-22 (×3): qty 1

## 2023-12-22 MED ORDER — ENSURE ENLIVE PO LIQD
237.0000 mL | Freq: Two times a day (BID) | ORAL | Status: DC
  Administered 2023-12-23: 237 mL via ORAL

## 2023-12-22 MED ORDER — ONDANSETRON HCL 4 MG/2ML IJ SOLN: 4.0000 mg | Freq: Four times a day (QID) | INTRAMUSCULAR | Status: DC | PRN

## 2023-12-22 MED ORDER — LOSARTAN POTASSIUM 25 MG PO TABS
25.0000 mg | ORAL_TABLET | Freq: Every day | ORAL | Status: DC
  Administered 2023-12-22: 25 mg via ORAL
  Filled 2023-12-22 (×2): qty 1

## 2023-12-22 MED ORDER — CLOPIDOGREL BISULFATE 75 MG PO TABS
300.0000 mg | ORAL_TABLET | Freq: Once | ORAL | Status: AC
  Administered 2023-12-22: 300 mg via ORAL
  Filled 2023-12-22: qty 4

## 2023-12-22 MED ORDER — LIDOCAINE 5 % EX PTCH
1.0000 | MEDICATED_PATCH | Freq: Every day | CUTANEOUS | Status: DC
  Administered 2023-12-22 – 2023-12-23 (×2): 1 via TRANSDERMAL
  Filled 2023-12-22 (×2): qty 1

## 2023-12-22 MED ORDER — SODIUM CHLORIDE 0.9 % IV SOLN: INTRAVENOUS | Status: DC

## 2023-12-22 MED ORDER — CLOPIDOGREL BISULFATE 75 MG PO TABS
75.0000 mg | ORAL_TABLET | Freq: Every day | ORAL | Status: DC
  Administered 2023-12-23: 75 mg via ORAL
  Filled 2023-12-22: qty 1

## 2023-12-22 MED ORDER — ACETAMINOPHEN 650 MG RE SUPP
650.0000 mg | Freq: Four times a day (QID) | RECTAL | Status: DC | PRN
Start: 2023-12-22 — End: 2023-12-23

## 2023-12-22 MED ORDER — TRAZODONE HCL 50 MG PO TABS: 25.0000 mg | ORAL_TABLET | Freq: Every evening | ORAL | Status: DC | PRN

## 2023-12-22 MED ORDER — CYCLOBENZAPRINE HCL 10 MG PO TABS: 10.0000 mg | ORAL_TABLET | Freq: Three times a day (TID) | ORAL | Status: DC | PRN

## 2023-12-22 MED ORDER — OXYCODONE HCL 5 MG PO TABS: 5.0000 mg | ORAL_TABLET | ORAL | Status: DC | PRN

## 2023-12-22 MED ORDER — ATORVASTATIN CALCIUM 20 MG PO TABS
40.0000 mg | ORAL_TABLET | Freq: Every day | ORAL | Status: DC
  Administered 2023-12-22 (×2): 40 mg via ORAL
  Filled 2023-12-22 (×2): qty 2

## 2023-12-22 MED ORDER — ASPIRIN 81 MG PO TBEC
81.0000 mg | DELAYED_RELEASE_TABLET | Freq: Every day | ORAL | Status: DC
  Administered 2023-12-22 – 2023-12-23 (×2): 81 mg via ORAL
  Filled 2023-12-22 (×2): qty 1

## 2023-12-22 MED ORDER — ONDANSETRON HCL 4 MG PO TABS: 4.0000 mg | ORAL_TABLET | Freq: Four times a day (QID) | ORAL | Status: DC | PRN

## 2023-12-22 NOTE — Assessment & Plan Note (Addendum)
-   This involves the left medulla and cerebellum with subsequent left-sided numbness. - The patient will be admitted to an observation medically monitored bed.   - We will follow neuro checks q.4 hours for 24 hours.   - The patient will be placed on aspirin.   - Will obtain  2D echo with bubble study .   - A neurology consultation  as well as physical/occupation/speech therapy consults will be obtained in a.m.. - Dr. Amada Jupiter was notified about the patient. - The patient will be placed on statin therapy and fasting lipids will be checked.
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
## 2023-12-22 NOTE — Procedures (Incomplete)
Pt states blister to right heel. Pt's food is wrapped very well at this time. Pt states "do not unwrap that". Educated pt on need to assess wound. Pt verbalizes understanding. Pt refuses to allow RN to unwrap wound. Wound consult placed per protocol.
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
## 2023-12-22 NOTE — Assessment & Plan Note (Signed)
-   We will continue antihypertensive therapy and place the patient on as needed IV labetalol and hydralazine.
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
## 2023-12-22 NOTE — Assessment & Plan Note (Signed)
-  We will continue Neurontin. ?

## 2023-12-22 NOTE — TOC Initial Note (Signed)
Transition of Care [facility]) - Initial/Assessment Note    Patient Details  Name: [name] MRN: [id number] Date of Birth: Sep 14, 1961  Transition of Care [facility]) CM/SW Contact:    Allena Katz, LCSW Phone Number: 12/22/2023, 4:09 PM  Clinical Narrative:    Pt admitted from [facility] for LTC. Tammy reports that pt can come back tomorrow if ready.               Expected Discharge Plan: Skilled Nursing Facility Barriers to Discharge: Continued Medical Work up   Patient Goals and CMS Choice Patient states their goals for this hospitalization and ongoing recovery are:: return to [facility]          Expected Discharge Plan and Services                                              Prior Living Arrangements/Services   Lives with:: Facility Resident   Do you feel safe going back to the place where you live?: Yes      Need for Family Participation in Patient Care: Yes (Comment) Care giver support system in place?: Yes (comment)   Criminal Activity/Legal Involvement Pertinent to Current Situation/Hospitalization: Yes - Comment as needed  Activities of Daily Living   ADL Screening (condition at time of admission) Independently performs ADLs?: No Does the patient have a NEW difficulty with bathing/dressing/toileting/self-feeding that is expected to last >3 days?: No Does the patient have a NEW difficulty with getting in/out of bed, walking, or climbing stairs that is expected to last >3 days?: No Does the patient have a NEW difficulty with communication that is expected to last >3 days?: No Is the patient deaf or have difficulty hearing?: No Does the patient have difficulty seeing, even when wearing glasses/contacts?: Yes Does the patient have difficulty concentrating, remembering, or making decisions?: No  Permission Sought/Granted                  Emotional Assessment       Orientation: : Oriented to Self, Oriented to Place, Oriented to  Time, Oriented to  Situation      Admission diagnosis:  Tingling [R20.2] Acute CVA (cerebrovascular accident) [facility]) [I63.9] Patient Active Problem List   Diagnosis Date Noted   Acute CVA (cerebrovascular accident) (HCC) 12/22/2023   Hypertensive urgency 12/22/2023   Dyslipidemia 12/22/2023   Peripheral neuropathy 12/22/2023   CAD (coronary artery disease) 01/30/2022   Acute urinary retention 11/23/2021   History of hepatitis C 11/22/2021   Bacteremia due to Klebsiella pneumoniae 11/22/2021   UTI (urinary tract infection) 11/21/2021   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) 11/21/2021   Severe sepsis (HCC) 11/21/2021   Diabetes (HCC) 10/22/2021   Allergic rhinitis 10/22/2021   Generalized anxiety disorder 10/22/2021   GERD (gastroesophageal reflux disease) 10/22/2021   Chronic hepatitis (HCC) 10/22/2021   HL (hearing loss) 10/22/2021   Primary hypertension 10/22/2021   Iron deficiency anemia 10/22/2021   Hyperlipidemia 10/22/2021   Ischemic heart disease 10/22/2021   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HCC) 10/22/2021   Chronic renal disease, stage V (HCC) 10/22/2021   Difficulty walking 12/10/2020   Tremor 11/04/2018   Left leg pain 08/29/2015   Cerumen impaction 05/31/2013   Hearing loss of both ears 05/31/2013   Chronic osteomyelitis of lower leg (HCC) 07/23/2012   PCP:  Burtis Junes, MD (Inactive) Pharmacy:   [facility] - [place], [place] - [address] [place] [address] Phone: [phone] Fax: [phone]     Social Drivers of Health (SDOH) Social History: SDOH Screenings   Food Insecurity: No Food Insecurity (12/22/2023)  Housing: Low Risk  (12/22/2023)  Transportation Needs: No Transportation Needs (12/22/2023)  Utilities: Not At Risk (12/22/2023)  Financial Resource Strain: Low Risk  (12/18/2023)   Received from [facility]  Social Connections: Unknown (12/22/2023)  Tobacco Use: High Risk (12/21/2023)   SDOH Interventions:      Readmission Risk Interventions     No data to display

## 2023-12-22 NOTE — Progress Notes (Signed)
MRI results reviewed, possible basilar occlusion. With him starting symptoms on Thursday and reporting stable symptoms since then, I do not think that he is a candidate for any type of emergency revascularization. Will continue current management and discuss with him tomorrow.  Ritta Slot, MD [facility] [phone]  If 7pm- 7am, please page neurology on call as listed in AMION.
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
## 2023-12-22 NOTE — ED Provider Notes (Incomplete)
-----------------------------------------   12:02 AM on 12/22/2023 -----------------------------------------   MRI positive for acute infarcts.  Will consult hospitalist services for evaluation and admission.

## 2023-12-22 NOTE — ED Provider Notes (Signed)
-----------------------------------------   12:02 AM on 12/22/2023 -----------------------------------------   MRI positive for acute infarcts.  Will consult hospitalist services for evaluation and admission.    Irean Hong, MD 12/22/23 [phone]
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
## 2023-12-22 NOTE — Consult Note (Addendum)
WOC Nurse Consult Note: Reason for Consult: Consult requested for right buttock and right heel.  Pt has pink dry scar tissue to right buttock from previous pressure injury which has healed.  No open wound or drainage which requires topical treatment.  Right heel with Unstageable pressure injury; 1.5X1.5cm dry eschar; no odor, drainage,or fluctuance. It is best practice to leave dry eschar in place to heels.  Pressure Injury POA: Yes Dressing procedure/placement/frequency: Topical treatment orders provided for bedside nurses to perform as follows: Float heels to reduce pressure. Foam dressing to right heel, change Q 3 days or PRN soiling Please re-consult if further assistance is needed.  Thank-you,  Cammie Mcgee MSN, RN, CWOCN, [place], CNS [phone]
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
## 2023-12-22 NOTE — Progress Notes (Signed)
Anticoagulation monitoring(Lovenox):  63 yo  male ordered Lovenox 40 mg Q24h    Filed Weights   12/21/23 1530  Weight: 70.3 kg (155 lb)   BMI 21   Lab Results  Component Value Date   CREATININE 3.68 (H) 12/21/2023   CREATININE 3.50 (H) 02/01/2022   CREATININE 4.01 (H) 11/25/2021   Estimated Creatinine Clearance: 20.2 mL/min (A) (by C-G formula based on SCr of 3.68 mg/dL (H)). Hemoglobin & Hematocrit     Component Value Date/Time   HGB 11.7 (L) 12/21/2023 1545   HCT 36.1 (L) 12/21/2023 1545     Per Protocol for Patient with estCrcl < 30 ml/min and BMI < 40, will transition to Lovenox 30 mg Q24h.

## 2023-12-22 NOTE — Progress Notes (Signed)
*  PRELIMINARY RESULTS* Echocardiogram 2D Echocardiogram has been performed.  [name] 12/22/2023, 7:50 AM

## 2023-12-22 NOTE — Assessment & Plan Note (Signed)
-   The patient will be placed on statin therapy and will check lipid profile.

## 2023-12-23 DIAGNOSIS — E785 Hyperlipidemia, unspecified: Secondary | ICD-10-CM | POA: Diagnosis not present

## 2023-12-23 DIAGNOSIS — I160 Hypertensive urgency: Secondary | ICD-10-CM | POA: Diagnosis not present

## 2023-12-23 DIAGNOSIS — I639 Cerebral infarction, unspecified: Secondary | ICD-10-CM | POA: Diagnosis not present

## 2023-12-23 MED ORDER — VITAMIN C 500 MG PO TABS
250.0000 mg | ORAL_TABLET | Freq: Two times a day (BID) | ORAL | Status: DC
Start: 2023-12-23 — End: 2023-12-23
  Administered 2023-12-23: 250 mg via ORAL
  Filled 2023-12-23: qty 1

## 2023-12-23 MED ORDER — ASPIRIN 81 MG PO TBEC: 81.0000 mg | DELAYED_RELEASE_TABLET | Freq: Every day | ORAL | 12 refills | Status: DC

## 2023-12-23 MED ORDER — VITAMIN D 25 MCG (1000 UNIT) PO TABS
2000.0000 [IU] | ORAL_TABLET | Freq: Every day | ORAL | Status: DC
  Administered 2023-12-23: 2000 [IU] via ORAL
  Filled 2023-12-23: qty 2

## 2023-12-23 MED ORDER — ROPINIROLE HCL 0.25 MG PO TABS: 0.2500 mg | ORAL_TABLET | Freq: Every day | ORAL | Status: DC

## 2023-12-23 MED ORDER — FERROUS SULFATE 325 (65 FE) MG PO TABS
325.0000 mg | ORAL_TABLET | ORAL | Status: DC
  Administered 2023-12-23: 325 mg via ORAL
  Filled 2023-12-23: qty 1

## 2023-12-23 MED ORDER — ATORVASTATIN CALCIUM 40 MG PO TABS: 40.0000 mg | ORAL_TABLET | Freq: Every day | ORAL | 2 refills | Status: AC

## 2023-12-23 MED ORDER — ENSURE ENLIVE PO LIQD: 237.0000 mL | Freq: Two times a day (BID) | ORAL | 12 refills | Status: AC

## 2023-12-23 MED ORDER — GABAPENTIN 100 MG PO CAPS
200.0000 mg | ORAL_CAPSULE | Freq: Two times a day (BID) | ORAL | Status: DC
  Administered 2023-12-23: 200 mg via ORAL
  Filled 2023-12-23: qty 2

## 2023-12-23 MED ORDER — CLOPIDOGREL BISULFATE 75 MG PO TABS: 75.0000 mg | ORAL_TABLET | Freq: Every day | ORAL | 0 refills | Status: AC

## 2023-12-23 MED ORDER — AMLODIPINE BESYLATE 10 MG PO TABS
10.0000 mg | ORAL_TABLET | Freq: Every day | ORAL | Status: DC
  Administered 2023-12-23: 10 mg via ORAL
  Filled 2023-12-23 (×2): qty 1

## 2023-12-23 MED ORDER — OXYCODONE HCL ER 10 MG PO T12A: 10.0000 mg | EXTENDED_RELEASE_TABLET | Freq: Every day | ORAL | 0 refills | Status: DC

## 2023-12-23 MED ORDER — OXYCODONE HCL 5 MG PO CAPS: 5.0000 mg | ORAL_CAPSULE | ORAL | 0 refills | Status: DC | PRN

## 2023-12-23 NOTE — Discharge Summary (Addendum)
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 frontal lobes is stable. Mild generalized atrophy and white matter disease otherwise is also stable. Ex vacuo dilation of the frontal horns of the lateral ventricles bilaterally stable. Ventricles are otherwise of normal size. Deep brain nuclei are within normal limits. No  significant extraaxial fluid collection is present. 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 Brainstem and cerebellum are otherwise within normal limits. Midline structures are within normal limits. Vascular: No hyperdense vessel or unexpected calcification. Skull: Calvarium is intact. No focal lytic or blastic lesions are present. No significant extracranial soft tissue lesion is present. Sinuses/Orbits: The paranasal sinuses and mastoid air cells are clear. Bilateral globe prostheses are noted. IMPRESSION: 1. No acute intracranial abnormality or significant interval change. 2. 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 3. Stable mild  generalized atrophy and white matter disease. This likely reflects the sequela of chronic microvascular ischemia. 4. Remote infarct of the left cerebellum. Electronically Signed   By: Marin Roberts M.D.   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    ANTIMICROBIAL SUSCEPTIBILITY Comment  Final    Comment:       ** S = Susceptible; I = Intermediate; R = Resistant **                    P = Positive; N = Negative             MICS are expressed in micrograms per mL    Antibiotic                 RSLT#1    RSLT#2    RSLT#3    RSLT#4 Amikacin                       S Cefepime                       S Ceftazidime                    S Ciprofloxacin                  S Gentamicin                     S Imipenem                       I Levofloxacin                   I Meropenem                      R Piperacillin                   S Ticarcillin                    R Tobramycin                     S     Labs: CBC: Recent Labs  Lab 12/21/23 1545 12/22/23 0630  WBC 9.3 7.6  NEUTROABS 6.7  --   HGB 11.7* 11.1*  HCT 36.1* 34.3*  MCV 85.1 85.1  PLT 244 205   Basic Metabolic Panel: Recent Labs  Lab 12/21/23 1545 12/22/23 0630  NA 135 138  K 5.1 5.0  CL 99 107  CO2 22 22  GLUCOSE 199* 129*  BUN 62* 62*  CREATININE 3.68* 3.70*  CALCIUM 9.4 9.1   Liver Function Tests: Recent Labs  Lab 12/21/23 1545  AST 22  ALT 19  ALKPHOS 56  BILITOT 0.5  PROT 7.5  ALBUMIN 3.8    Discharge time spent: greater than 30 minutes.  Signed: Enedina Finner, MD [facility] 12/23/2023

## 2023-12-23 NOTE — Discharge Instructions (Signed)
Keep log of BP at SNF and adjust BP meds

## 2023-12-23 NOTE — [place] FL2 (Signed)
[place] MEDICAID FL2 LEVEL OF CARE FORM     IDENTIFICATION  Patient Name: [name] Birthdate: Jan 29, 1961 Sex: male Admission Date (Current Location): 12/21/2023  [place] and [place] Number:  Chiropodist and Address:  [facility], [address], [place], [place] [address]      Provider Number: [id number]  Attending Physician Name and Address:  Enedina Finner, MD  Relative Name and Phone Number:  Doylene Canard (Sister)  [phone]    Current Level of Care: Hospital Recommended Level of Care: Skilled Nursing Facility Prior Approval Number:    Date Approved/Denied:   PASRR Number:    Discharge Plan: SNF    Current Diagnoses: Patient Active Problem List   Diagnosis Date Noted   Acute CVA (cerebrovascular accident) (HCC) 12/22/2023   Hypertensive urgency 12/22/2023   Dyslipidemia 12/22/2023   Peripheral neuropathy 12/22/2023   CAD (coronary artery disease) 01/30/2022   Acute urinary retention 11/23/2021   History of hepatitis C 11/22/2021   Bacteremia due to Klebsiella pneumoniae 11/22/2021   UTI (urinary tract infection) 11/21/2021   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) 11/21/2021   Severe sepsis (HCC) 11/21/2021   Diabetes (HCC) 10/22/2021   Allergic rhinitis 10/22/2021   Generalized anxiety disorder 10/22/2021   GERD (gastroesophageal reflux disease) 10/22/2021   Chronic hepatitis (HCC) 10/22/2021   HL (hearing loss) 10/22/2021   Primary hypertension 10/22/2021   Iron deficiency anemia 10/22/2021   Hyperlipidemia 10/22/2021   Ischemic heart disease 10/22/2021   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HCC) 10/22/2021   Chronic renal disease, stage V (HCC) 10/22/2021   Difficulty walking 12/10/2020   Tremor 11/04/2018   Left leg pain 08/29/2015   Cerumen impaction 05/31/2013   Hearing loss of both ears 05/31/2013   Chronic osteomyelitis of lower leg (HCC) 07/23/2012    Orientation RESPIRATION BLADDER Height & Weight     Self, Time,  Situation, Place  Normal Continent, Incontinent Weight: 155 lb (70.3 kg) Height:  6' (182.9 cm)  BEHAVIORAL SYMPTOMS/MOOD NEUROLOGICAL BOWEL NUTRITION STATUS      Incontinent    AMBULATORY STATUS COMMUNICATION OF NEEDS Skin     Verbally  (unstageable heel wound)                       Personal Care Assistance Level of Assistance              Functional Limitations Info             SPECIAL CARE FACTORS FREQUENCY                       Contractures Contractures Info: Not present    Additional Factors Info  Code Status Code Status Info: FULL             Current Medications (12/23/2023):  This is the current hospital active medication list Current Facility-Administered Medications  Medication Dose Route Frequency Provider Last Rate Last Admin    stroke: early stages of recovery book   Does not apply Once Mansy, Jan A, MD       acetaminophen (TYLENOL) tablet 650 mg  650 mg Oral Q6H PRN Mansy, Jan A, MD       Or   acetaminophen (TYLENOL) suppository 650 mg  650 mg Rectal Q6H PRN Mansy, Jan A, MD       amLODipine (NORVASC) tablet 10 mg  10 mg Oral Daily Enedina Finner, MD   10 mg at 12/23/23 [phone]  ascorbic acid (VITAMIN C) tablet 250 mg  250 mg Oral BID Enedina Finner, MD   250 mg at 12/23/23 0840   aspirin EC tablet 81 mg  81 mg Oral Daily Rejeana Brock, MD   81 mg at 12/23/23 0845   atorvastatin (LIPITOR) tablet 40 mg  40 mg Oral QHS Mansy, Jan A, MD   40 mg at 12/22/23 2245   cholecalciferol (VITAMIN D3) 25 MCG (1000 UNIT) tablet 2,000 Units  2,000 Units Oral Daily Enedina Finner, MD   2,000 Units at 12/23/23 0839   clopidogrel (PLAVIX) tablet 75 mg  75 mg Oral Daily Rejeana Brock, MD   75 mg at 12/23/23 0839   cyclobenzaprine (FLEXERIL) tablet 10 mg  10 mg Oral Q8H PRN Mansy, Jan A, MD       enoxaparin (LOVENOX) injection 30 mg  30 mg Subcutaneous Q24H Mansy, Jan A, MD   30 mg at 12/23/23 0841   feeding supplement (ENSURE ENLIVE / ENSURE PLUS)  liquid 237 mL  237 mL Oral BID BM Enedina Finner, MD   237 mL at 12/23/23 0841   ferrous sulfate tablet 325 mg  325 mg Oral Michail Sermon, MD   325 mg at 12/23/23 0840   gabapentin (NEURONTIN) capsule 200 mg  200 mg Oral BID Enedina Finner, MD   200 mg at 12/23/23 1610   lidocaine (LIDODERM) 5 % 1 patch  1 patch Transdermal Daily Mansy, Jan A, MD   1 patch at 12/23/23 0845   magnesium hydroxide (MILK OF MAGNESIA) suspension 30 mL  30 mL Oral Daily PRN Mansy, Jan A, MD       multivitamin with minerals tablet 1 tablet  1 tablet Oral Daily Mansy, Jan A, MD   1 tablet at 12/23/23 0845   ondansetron [facility]) tablet 4 mg  4 mg Oral Q6H PRN Mansy, Jan A, MD       Or   ondansetron [facility]) injection 4 mg  4 mg Intravenous Q6H PRN Mansy, Jan A, MD       oxyCODONE (Oxy IR/ROXICODONE) immediate release tablet 5 mg  5 mg Oral Q4H PRN Mansy, Jan A, MD       oxyCODONE (OXYCONTIN) 12 hr tablet 10 mg  10 mg Oral QHS Mansy, Jan A, MD   10 mg at 12/22/23 2245   primidone (MYSOLINE) tablet 50 mg  50 mg Oral BID Mansy, Jan A, MD   50 mg at 12/23/23 9604   rOPINIRole (REQUIP) tablet 0.25 mg  0.25 mg Oral QHS Enedina Finner, MD       senna-docusate (Senokot-S) tablet 1 tablet  1 tablet Oral BID Mansy, Jan A, MD   1 tablet at 12/23/23 5409   sodium chloride flush (NS) 0.9 % injection 3 mL  3 mL Intravenous Once Concha Se, MD       traZODone (DESYREL) tablet 25 mg  25 mg Oral QHS PRN Mansy, Vernetta Honey, MD         Discharge Medications: Please see discharge summary for a list of discharge medications.   STOP taking these medications     cyclobenzaprine 10 MG tablet Commonly known as: FLEXERIL    losartan 100 MG tablet Commonly known as: COZAAR    losartan 25 MG tablet Commonly known as: COZAAR           TAKE these medications     amLODipine 10 MG tablet Commonly known as: NORVASC Take 10 mg by mouth daily.  ammonium lactate 12 % lotion Commonly known as: LAC-HYDRIN Apply 1 Application topically at  bedtime. Apply to arms, lower legs, feet, and occipital scalp    ascorbic acid 500 MG tablet Commonly known as: VITAMIN C Take 250 mg by mouth 2 (two) times daily.    aspirin EC 81 MG tablet Take 1 tablet (81 mg total) by mouth daily. Swallow whole.    atorvastatin 40 MG tablet Commonly known as: LIPITOR Take 1 tablet (40 mg total) by mouth at bedtime. What changed:  medication strength how much to take    cholecalciferol 25 MCG (1000 UNIT) tablet Commonly known as: VITAMIN D3 Take 2,000 Units by mouth daily.    clopidogrel 75 MG tablet Commonly known as: PLAVIX Take 1 tablet (75 mg total) by mouth daily for 20 days.    eucerin cream Apply 1 application  topically in the morning and at bedtime. Apply to BLE    feeding supplement Liqd Take 237 mLs by mouth 2 (two) times daily between meals.    ferrous sulfate 325 (65 FE) MG tablet Take 325 mg by mouth every other day.    gabapentin 100 MG capsule Commonly known as: NEURONTIN Take 200 mg by mouth 2 (two) times daily.    hydrALAZINE 25 MG tablet Commonly known as: APRESOLINE Take 25 mg by mouth as needed (for bp>160).    ketoconazole 2 % shampoo Commonly known as: NIZORAL Apply 1 application topically 2 (two) times a week.    lidocaine 5 % Commonly known as: LIDODERM Place 1 patch onto the skin daily. Apply to left foot and right middle finger    magnesium hydroxide 400 MG/5ML suspension Commonly known as: MILK OF MAGNESIA Take 5 mLs by mouth daily as needed for mild constipation.    MULTIVITAMIN PLUS IRON ADULT PO Take 1 tablet by mouth daily.    naloxone 4 MG/0.1ML Liqd nasal spray kit Commonly known as: NARCAN Place 4 mg into the nose 3 (three) times daily as needed.    oxyCODONE 10 mg 12 hr tablet Commonly known as: OXYCONTIN Take 1 tablet (10 mg total) by mouth at bedtime. What changed: Another medication with the same name was removed. Continue taking this medication, and follow the directions you see  here.    oxycodone 5 MG capsule Commonly known as: OXY-IR Take 1 capsule (5 mg total) by mouth every 4 (four) hours as needed. What changed: Another medication with the same name was removed. Continue taking this medication, and follow the directions you see here.    primidone 50 MG tablet Commonly known as: MYSOLINE Take 12.5 mg by mouth 2 (two) times daily.    rOPINIRole 0.25 MG tablet Commonly known as: REQUIP Take 0.25 mg by mouth at bedtime.    senna-docusate 8.6-50 MG tablet Commonly known as: Senokot-S Take 1 tablet by mouth 2 (two) times daily. HOLD FOR LOOSE STOOLS     Relevant Imaging Results:  Relevant Lab Results:   Additional Information SSN:[id number]  Allena Katz, LCSW

## 2023-12-23 NOTE — TOC Transition Note (Signed)
Transition of Care [facility]) - Discharge Note   Patient Details  Name: [name] MRN: [id number] Date of Birth: 1961-09-11  Transition of Care [facility]) CM/SW Contact:  Allena Katz, LCSW Phone Number: 12/23/2023, 9:27 AM   Clinical Narrative:   Pt discharging [facility] to [facility]. Dc summary sent. Tammy notified. Medical necessity on unit.    Final next level of care: Skilled Nursing Facility Barriers to Discharge: Barriers Resolved   Patient Goals and CMS Choice Patient states their goals for this hospitalization and ongoing recovery are:: [facility]          [facility]acement              Patient chooses bed at: [facility] [place] Patient to be transferred to facility by: [facility]   Patient and family notified of of transfer: 12/23/23  Discharge Plan and Services Additional resources added to the After Visit Summary for                                       Social Drivers of Health (SDOH) Interventions SDOH Screenings   Food Insecurity: No Food Insecurity (12/22/2023)  Housing: Low Risk  (12/22/2023)  Transportation Needs: No Transportation Needs (12/22/2023)  Utilities: Not At Risk (12/22/2023)  Financial Resource Strain: Low Risk  (12/18/2023)   Received from [facility]  Social Connections: Unknown (12/22/2023)  Tobacco Use: High Risk (12/21/2023)     Readmission Risk Interventions     No data to display

## 2023-12-23 NOTE — Plan of Care (Signed)
  Problem: Education: Goal: Knowledge of disease or condition will improve Outcome: Progressing Goal: Knowledge of secondary prevention will improve (MUST DOCUMENT ALL) Outcome: Progressing Goal: Knowledge of patient specific risk factors will improve (DELETE if not current risk factor) Outcome: Progressing   Problem: Ischemic Stroke/TIA Tissue Perfusion: Goal: Complications of ischemic stroke/TIA will be minimized Outcome: Progressing   Problem: Coping: Goal: Will verbalize positive feelings about self Outcome: Progressing Goal: Will identify appropriate support needs Outcome: Progressing   Problem: Health Behavior/Discharge Planning: Goal: Ability to manage health-related needs will improve Outcome: Progressing Goal: Goals will be collaboratively established with patient/family Outcome: Progressing   Problem: Self-Care: Goal: Ability to participate in self-care as condition permits will improve Outcome: Progressing Goal: Verbalization of feelings and concerns over difficulty with self-care will improve Outcome: Progressing Goal: Ability to communicate needs accurately will improve Outcome: Progressing   Problem: Nutrition: Goal: Risk of aspiration will decrease Outcome: Progressing Goal: Dietary intake will improve Outcome: Progressing   Problem: Education: Goal: Knowledge of General Education information will improve Description: Including pain rating scale, medication(s)/side effects and non-pharmacologic comfort measures Outcome: Progressing   Problem: Health Behavior/Discharge Planning: Goal: Ability to manage health-related needs will improve Outcome: Progressing   Problem: Clinical Measurements: Goal: Ability to maintain clinical measurements within normal limits will improve Outcome: Progressing Goal: Will remain free from infection Outcome: Progressing Goal: Diagnostic test results will improve Outcome: Progressing Goal: Respiratory complications will  improve Outcome: Progressing Goal: Cardiovascular complication will be avoided Outcome: Progressing   Problem: Activity: Goal: Risk for activity intolerance will decrease Outcome: Progressing   Problem: Nutrition: Goal: Adequate nutrition will be maintained Outcome: Progressing   Problem: Coping: Goal: Level of anxiety will decrease Outcome: Progressing   Problem: Elimination: Goal: Will not experience complications related to bowel motility Outcome: Progressing Goal: Will not experience complications related to urinary retention Outcome: Progressing   Problem: Pain Managment: Goal: General experience of comfort will improve and/or be controlled Outcome: Progressing   Problem: Skin Integrity: Goal: Risk for impaired skin integrity will decrease Outcome: Progressing

## 2024-01-01 ENCOUNTER — Ambulatory Visit: Payer: Self-pay | Admitting: Urology

## 2024-01-13 ENCOUNTER — Encounter: Payer: Medicaid Other | Attending: Physician Assistant | Admitting: Physician Assistant

## 2024-01-13 DIAGNOSIS — H548 Legal blindness, as defined in USA: Secondary | ICD-10-CM | POA: Diagnosis not present

## 2024-01-13 DIAGNOSIS — E11621 Type 2 diabetes mellitus with foot ulcer: Secondary | ICD-10-CM | POA: Diagnosis present

## 2024-01-13 DIAGNOSIS — I7389 Other specified peripheral vascular diseases: Secondary | ICD-10-CM | POA: Diagnosis not present

## 2024-01-13 DIAGNOSIS — L97412 Non-pressure chronic ulcer of right heel and midfoot with fat layer exposed: Secondary | ICD-10-CM | POA: Insufficient documentation

## 2024-01-13 DIAGNOSIS — E1143 Type 2 diabetes mellitus with diabetic autonomic (poly)neuropathy: Secondary | ICD-10-CM | POA: Diagnosis not present

## 2024-01-13 DIAGNOSIS — Z7901 Long term (current) use of anticoagulants: Secondary | ICD-10-CM | POA: Diagnosis not present

## 2024-01-13 DIAGNOSIS — I10 Essential (primary) hypertension: Secondary | ICD-10-CM | POA: Diagnosis not present

## 2024-01-13 DIAGNOSIS — I2510 Atherosclerotic heart disease of native coronary artery without angina pectoris: Secondary | ICD-10-CM | POA: Insufficient documentation

## 2024-01-22 ENCOUNTER — Encounter: Payer: Medicaid Other | Admitting: Physician Assistant

## 2024-01-22 DIAGNOSIS — E11621 Type 2 diabetes mellitus with foot ulcer: Secondary | ICD-10-CM | POA: Diagnosis not present

## 2024-02-26 ENCOUNTER — Ambulatory Visit: Payer: Medicaid Other | Admitting: Urology

## 2024-02-26 DIAGNOSIS — Z87898 Personal history of other specified conditions: Secondary | ICD-10-CM

## 2024-03-02 ENCOUNTER — Other Ambulatory Visit (INDEPENDENT_AMBULATORY_CARE_PROVIDER_SITE_OTHER): Payer: Self-pay | Admitting: Nurse Practitioner

## 2024-03-02 DIAGNOSIS — L97419 Non-pressure chronic ulcer of right heel and midfoot with unspecified severity: Secondary | ICD-10-CM

## 2024-03-03 ENCOUNTER — Ambulatory Visit (INDEPENDENT_AMBULATORY_CARE_PROVIDER_SITE_OTHER): Admitting: Nurse Practitioner

## 2024-03-03 ENCOUNTER — Encounter (INDEPENDENT_AMBULATORY_CARE_PROVIDER_SITE_OTHER): Payer: Self-pay | Admitting: Nurse Practitioner

## 2024-03-03 ENCOUNTER — Ambulatory Visit (INDEPENDENT_AMBULATORY_CARE_PROVIDER_SITE_OTHER)

## 2024-03-03 VITALS — BP 180/75 | HR 85 | Resp 18 | Ht 74.0 in | Wt 174.8 lb

## 2024-03-03 DIAGNOSIS — E1121 Type 2 diabetes mellitus with diabetic nephropathy: Secondary | ICD-10-CM | POA: Diagnosis not present

## 2024-03-03 DIAGNOSIS — I70221 Atherosclerosis of native arteries of extremities with rest pain, right leg: Secondary | ICD-10-CM | POA: Diagnosis not present

## 2024-03-03 DIAGNOSIS — N185 Chronic kidney disease, stage 5: Secondary | ICD-10-CM

## 2024-03-03 DIAGNOSIS — I10 Essential (primary) hypertension: Secondary | ICD-10-CM | POA: Diagnosis not present

## 2024-03-03 DIAGNOSIS — L97419 Non-pressure chronic ulcer of right heel and midfoot with unspecified severity: Secondary | ICD-10-CM
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
## 2024-03-04 LAB — VAS US ABI WITH/WO TBI
Left ABI: 1.04
Right ABI: 0.66

## 2024-03-10 ENCOUNTER — Telehealth (INDEPENDENT_AMBULATORY_CARE_PROVIDER_SITE_OTHER): Payer: Self-pay

## 2024-03-10 NOTE — Telephone Encounter (Signed)
 Spoke with Odilia Bennett at [facility] and the patient is scheduled with Dr. Prescilla Brod for a RLE angio on 03/23/24 with a 9:00 am arrival time to the [facility]. Pre-procedure instructions were discussed and will be faxed to attention Odilia Bennett at [facility].

## 2024-03-16 ENCOUNTER — Ambulatory Visit (INDEPENDENT_AMBULATORY_CARE_PROVIDER_SITE_OTHER): Admitting: Urology

## 2024-03-16 VITALS — BP 136/71 | HR 63

## 2024-03-16 DIAGNOSIS — Z87898 Personal history of other specified conditions: Secondary | ICD-10-CM | POA: Diagnosis not present

## 2024-03-16 LAB — BLADDER SCAN AMB NON-IMAGING

## 2024-03-16 NOTE — Progress Notes (Signed)
   03/16/2024 1:45 PM   [name] 1961-11-04 [id number]  Reason for visit: Follow up BPH/retention s/p HoLEP  HPI: Extremely comorbid 63 year old male with Foley dependent urinary retention who failed multiple voiding trials, and prostate measured 22 g on CT with no other abnormality seen on cystoscopy.  We reviewed possible etiologies including atonic/neurogenic bladder, or BPH with outlet obstruction. He opted for HoLEP to give him the best chance to resume voiding spontaneously and avoid chronic foley/SP tube.  He underwent uncomplicated HOLEP on 02/01/2022 with removal 18 g of benign prostate tissue.  He passed a voiding trial postop with a PVR of 200 mL but was voiding spontaneously.  He continues to urinate well.  He occasionally has some weak stream.  Bladder scan this afternoon but he denies the urge to void, he refused to try to void for PVR.  He denies any incontinence.  I recommended continuing timed voiding.    -He prefers follow-up with urology as needed -Would avoid Foley catheter unless develops true retention with inability to urinate and lower abdominal discomfort or significantly elevated PVRs with upstream hydronephrosis that could contribute to worsening renal function   Lawerence Pressman, MD  [facility] [address], [address] [place], [place] [address] [phone]

## 2024-03-23 ENCOUNTER — Other Ambulatory Visit: Payer: Self-pay

## 2024-03-23 ENCOUNTER — Encounter
Admission: RE | Disposition: A | Discharge status: Skilled Nursing Facility | Payer: Self-pay | Source: Home / Self Care | Attending: Vascular Surgery

## 2024-03-23 ENCOUNTER — Inpatient Hospital Stay
Admission: RE | Admit: 2024-03-23 | Discharge: 2024-03-29 | DRG: 253 | Disposition: A | Discharge status: Skilled Nursing Facility | Attending: Vascular Surgery | Admitting: Vascular Surgery

## 2024-03-23 ENCOUNTER — Encounter: Payer: Self-pay | Admitting: Vascular Surgery

## 2024-03-23 ENCOUNTER — Telehealth (INDEPENDENT_AMBULATORY_CARE_PROVIDER_SITE_OTHER): Payer: Self-pay | Admitting: Vascular Surgery

## 2024-03-23 DIAGNOSIS — E1122 Type 2 diabetes mellitus with diabetic chronic kidney disease: Secondary | ICD-10-CM | POA: Diagnosis present

## 2024-03-23 DIAGNOSIS — N179 Acute kidney failure, unspecified: Secondary | ICD-10-CM | POA: Diagnosis present

## 2024-03-23 DIAGNOSIS — D631 Anemia in chronic kidney disease: Secondary | ICD-10-CM | POA: Diagnosis present

## 2024-03-23 DIAGNOSIS — I70221 Atherosclerosis of native arteries of extremities with rest pain, right leg: Secondary | ICD-10-CM | POA: Diagnosis present

## 2024-03-23 DIAGNOSIS — I70235 Atherosclerosis of native arteries of right leg with ulceration of other part of foot: Secondary | ICD-10-CM | POA: Diagnosis not present

## 2024-03-23 DIAGNOSIS — I70202 Unspecified atherosclerosis of native arteries of extremities, left leg: Secondary | ICD-10-CM | POA: Diagnosis not present

## 2024-03-23 DIAGNOSIS — H547 Unspecified visual loss: Secondary | ICD-10-CM | POA: Diagnosis present

## 2024-03-23 DIAGNOSIS — N184 Chronic kidney disease, stage 4 (severe): Secondary | ICD-10-CM | POA: Diagnosis present

## 2024-03-23 DIAGNOSIS — L97519 Non-pressure chronic ulcer of other part of right foot with unspecified severity: Secondary | ICD-10-CM

## 2024-03-23 DIAGNOSIS — Z7982 Long term (current) use of aspirin: Secondary | ICD-10-CM

## 2024-03-23 DIAGNOSIS — E785 Hyperlipidemia, unspecified: Secondary | ICD-10-CM | POA: Diagnosis present

## 2024-03-23 DIAGNOSIS — L7622 Postprocedural hemorrhage and hematoma of skin and subcutaneous tissue following other procedure: Secondary | ICD-10-CM | POA: Diagnosis not present

## 2024-03-23 DIAGNOSIS — I70299 Other atherosclerosis of native arteries of extremities, unspecified extremity: Principal | ICD-10-CM | POA: Diagnosis present

## 2024-03-23 DIAGNOSIS — L8990 Pressure ulcer of unspecified site, unspecified stage: Secondary | ICD-10-CM | POA: Insufficient documentation

## 2024-03-23 DIAGNOSIS — I129 Hypertensive chronic kidney disease with stage 1 through stage 4 chronic kidney disease, or unspecified chronic kidney disease: Secondary | ICD-10-CM | POA: Diagnosis present

## 2024-03-23 DIAGNOSIS — Z87440 Personal history of urinary (tract) infections: Secondary | ICD-10-CM

## 2024-03-23 DIAGNOSIS — I97618 Postprocedural hemorrhage and hematoma of a circulatory system organ or structure following other circulatory system procedure: Secondary | ICD-10-CM | POA: Diagnosis not present

## 2024-03-23 DIAGNOSIS — G8220 Paraplegia, unspecified: Secondary | ICD-10-CM | POA: Diagnosis present

## 2024-03-23 DIAGNOSIS — Z79899 Other long term (current) drug therapy: Secondary | ICD-10-CM

## 2024-03-23 DIAGNOSIS — I739 Peripheral vascular disease, unspecified: Principal | ICD-10-CM | POA: Diagnosis present

## 2024-03-23 DIAGNOSIS — L97419 Non-pressure chronic ulcer of right heel and midfoot with unspecified severity: Secondary | ICD-10-CM | POA: Diagnosis present

## 2024-03-23 DIAGNOSIS — F17210 Nicotine dependence, cigarettes, uncomplicated: Secondary | ICD-10-CM | POA: Diagnosis present

## 2024-03-23 DIAGNOSIS — E11621 Type 2 diabetes mellitus with foot ulcer: Secondary | ICD-10-CM | POA: Diagnosis present

## 2024-03-23 DIAGNOSIS — Z970 Presence of artificial eye: Secondary | ICD-10-CM

## 2024-03-23 DIAGNOSIS — Y838 Other surgical procedures as the cause of abnormal reaction of the patient, or of later complication, without mention of misadventure at the time of the procedure: Secondary | ICD-10-CM | POA: Diagnosis not present

## 2024-03-23 DIAGNOSIS — E1151 Type 2 diabetes mellitus with diabetic peripheral angiopathy without gangrene: Principal | ICD-10-CM | POA: Diagnosis present

## 2024-03-23 DIAGNOSIS — L97909 Non-pressure chronic ulcer of unspecified part of unspecified lower leg with unspecified severity: Principal | ICD-10-CM

## 2024-03-23 HISTORY — PX: LOWER EXTREMITY ANGIOGRAPHY: CATH118251

## 2024-03-23 LAB — GLUCOSE, CAPILLARY
Glucose-Capillary: 111 mg/dL — ABNORMAL HIGH (ref 70–99)
Glucose-Capillary: 98 mg/dL (ref 70–99)
Glucose-Capillary: 106 mg/dL — ABNORMAL HIGH (ref 70–99)
Glucose-Capillary: 112 mg/dL — ABNORMAL HIGH (ref 70–99)
Glucose-Capillary: 151 mg/dL — ABNORMAL HIGH (ref 70–99)

## 2024-03-23 LAB — HEMOGLOBIN A1C
Hgb A1c MFr Bld: 7 % — ABNORMAL HIGH (ref 4.8–5.6)
Mean Plasma Glucose: 154.2 mg/dL

## 2024-03-23 LAB — CBC
HCT: 35.4 % — ABNORMAL LOW (ref 39.0–52.0)
Hemoglobin: 11.3 g/dL — ABNORMAL LOW (ref 13.0–17.0)
MCH: 27.3 pg (ref 26.0–34.0)
MCHC: 31.9 g/dL (ref 30.0–36.0)
MCV: 85.5 fL (ref 80.0–100.0)
Platelets: 208 10*3/uL (ref 150–400)
RBC: 4.14 MIL/uL — ABNORMAL LOW (ref 4.22–5.81)
RDW: 14.1 % (ref 11.5–15.5)
WBC: 5.4 10*3/uL (ref 4.0–10.5)
nRBC: 0 % (ref 0.0–0.2)

## 2024-03-23 LAB — CREATININE, SERUM
Creatinine, Ser: 4.85 mg/dL — ABNORMAL HIGH (ref 0.61–1.24)
Creatinine, Ser: 4.69 mg/dL — ABNORMAL HIGH (ref 0.61–1.24)
GFR, Estimated: 13 mL/min — ABNORMAL LOW (ref >60)
GFR, Estimated: 13 mL/min — ABNORMAL LOW (ref >60)

## 2024-03-23 LAB — PREPARE RBC (CROSSMATCH)

## 2024-03-23 LAB — HEPATITIS B SURFACE ANTIGEN: Hepatitis B Surface Ag: NONREACTIVE

## 2024-03-23 LAB — BUN: BUN: 68 mg/dL — ABNORMAL HIGH (ref 8–23)

## 2024-03-23 SURGERY — LOWER EXTREMITY ANGIOGRAPHY
Anesthesia: Moderate Sedation | Site: Leg Lower | Laterality: Right

## 2024-03-23 MED ORDER — LIDOCAINE HCL (PF) 1 % IJ SOLN
INTRAMUSCULAR | Status: DC | PRN
  Administered 2024-03-23: 10 mL

## 2024-03-23 MED ORDER — CEFAZOLIN SODIUM-DEXTROSE 1-4 GM/50ML-% IV SOLN
1.0000 g | INTRAVENOUS | Status: AC
  Administered 2024-03-23: 1 g via INTRAVENOUS

## 2024-03-23 MED ORDER — HEPARIN SODIUM (PORCINE) 1000 UNIT/ML IJ SOLN
INTRAMUSCULAR | Status: DC | PRN
  Administered 2024-03-23: 6000 [IU] via INTRAVENOUS

## 2024-03-23 MED ORDER — ROPINIROLE HCL 0.25 MG PO TABS
0.2500 mg | ORAL_TABLET | Freq: Every day | ORAL | Status: DC
Start: 2024-03-23 — End: 2024-03-29
  Administered 2024-03-23 – 2024-03-28 (×6): 0.25 mg via ORAL
  Filled 2024-03-23 (×7): qty 1

## 2024-03-23 MED ORDER — LIDOCAINE-PRILOCAINE 2.5-2.5 % EX CREA: 1.0000 | TOPICAL_CREAM | CUTANEOUS | Status: DC | PRN

## 2024-03-23 MED ORDER — SENNOSIDES-DOCUSATE SODIUM 8.6-50 MG PO TABS
1.0000 | ORAL_TABLET | Freq: Two times a day (BID) | ORAL | Status: DC
  Administered 2024-03-24 – 2024-03-29 (×11): 1 via ORAL
  Filled 2024-03-23 (×10): qty 1

## 2024-03-23 MED ORDER — HEPARIN SODIUM (PORCINE) 1000 UNIT/ML IJ SOLN
INTRAMUSCULAR | Status: AC
  Filled 2024-03-23: qty 10

## 2024-03-23 MED ORDER — ONDANSETRON HCL 4 MG/2ML IJ SOLN: 4.0000 mg | Freq: Four times a day (QID) | INTRAMUSCULAR | Status: DC | PRN

## 2024-03-23 MED ORDER — SERTRALINE HCL 50 MG PO TABS
50.0000 mg | ORAL_TABLET | Freq: Every day | ORAL | Status: DC
  Administered 2024-03-24 – 2024-03-29 (×6): 50 mg via ORAL
  Filled 2024-03-23 (×7): qty 1

## 2024-03-23 MED ORDER — HEPARIN SODIUM (PORCINE) 5000 UNIT/ML IJ SOLN
5000.0000 [IU] | Freq: Three times a day (TID) | INTRAMUSCULAR | Status: DC
  Administered 2024-03-23 – 2024-03-24 (×2): 5000 [IU] via SUBCUTANEOUS
  Filled 2024-03-23 (×2): qty 1

## 2024-03-23 MED ORDER — IODIXANOL 320 MG/ML IV SOLN
INTRAVENOUS | Status: DC | PRN
  Administered 2024-03-23: 35 mL

## 2024-03-23 MED ORDER — SODIUM CHLORIDE 0.9% IV SOLUTION: Freq: Once | INTRAVENOUS | Status: DC

## 2024-03-23 MED ORDER — OXYCODONE HCL 5 MG PO TABS
5.0000 mg | ORAL_TABLET | ORAL | Status: DC | PRN
  Administered 2024-03-25: 10 mg via ORAL
  Administered 2024-03-26: 5 mg via ORAL
  Administered 2024-03-28: 10 mg via ORAL
  Filled 2024-03-23: qty 2
  Filled 2024-03-23: qty 1
  Filled 2024-03-23: qty 2

## 2024-03-23 MED ORDER — ASPIRIN 81 MG PO TBEC
81.0000 mg | DELAYED_RELEASE_TABLET | Freq: Every day | ORAL | Status: DC
  Administered 2024-03-24 – 2024-03-29 (×6): 81 mg via ORAL
  Filled 2024-03-23 (×6): qty 1

## 2024-03-23 MED ORDER — HEPARIN SODIUM (PORCINE) 1000 UNIT/ML DIALYSIS: 1000.0000 [IU] | INTRAMUSCULAR | Status: DC | PRN

## 2024-03-23 MED ORDER — MORPHINE SULFATE (PF) 4 MG/ML IV SOLN: 2.0000 mg | INTRAVENOUS | Status: DC | PRN

## 2024-03-23 MED ORDER — PRIMIDONE 50 MG PO TABS
12.5000 mg | ORAL_TABLET | Freq: Two times a day (BID) | ORAL | Status: DC
  Administered 2024-03-24 – 2024-03-29 (×11): 12.5 mg via ORAL
  Filled 2024-03-23 (×14): qty 0.25

## 2024-03-23 MED ORDER — FENTANYL CITRATE (PF) 100 MCG/2ML IJ SOLN
INTRAMUSCULAR | Status: DC | PRN
  Administered 2024-03-23: 12.5 ug via INTRAVENOUS

## 2024-03-23 MED ORDER — CEFAZOLIN SODIUM-DEXTROSE 2-4 GM/100ML-% IV SOLN: 2.0000 g | INTRAVENOUS | Status: DC

## 2024-03-23 MED ORDER — OXYCODONE HCL ER 10 MG PO T12A
10.0000 mg | EXTENDED_RELEASE_TABLET | Freq: Every day | ORAL | Status: DC
  Administered 2024-03-23 – 2024-03-28 (×6): 10 mg via ORAL
  Filled 2024-03-23 (×6): qty 1

## 2024-03-23 MED ORDER — HEPARIN (PORCINE) IN NACL 1000-0.9 UT/500ML-% IV SOLN
INTRAVENOUS | Status: DC | PRN
  Administered 2024-03-23: 500 mL

## 2024-03-23 MED ORDER — FENTANYL CITRATE (PF) 100 MCG/2ML IJ SOLN
INTRAMUSCULAR | Status: AC
  Filled 2024-03-23: qty 2

## 2024-03-23 MED ORDER — VANCOMYCIN HCL IN DEXTROSE 1-5 GM/200ML-% IV SOLN
1000.0000 mg | INTRAVENOUS | Status: AC
  Administered 2024-03-24: 1000 mg via INTRAVENOUS
  Filled 2024-03-23: qty 200

## 2024-03-23 MED ORDER — MIDAZOLAM HCL 5 MG/5ML IJ SOLN
INTRAMUSCULAR | Status: AC
  Filled 2024-03-23: qty 5

## 2024-03-23 MED ORDER — SODIUM CHLORIDE 0.9 % IV SOLN: INTRAVENOUS | Status: DC

## 2024-03-23 MED ORDER — ENSURE ENLIVE PO LIQD
237.0000 mL | Freq: Two times a day (BID) | ORAL | Status: DC
  Administered 2024-03-25 – 2024-03-28 (×4): 237 mL via ORAL

## 2024-03-23 MED ORDER — FAMOTIDINE 20 MG PO TABS: 40.0000 mg | ORAL_TABLET | Freq: Once | ORAL | Status: DC | PRN

## 2024-03-23 MED ORDER — CEFAZOLIN SODIUM-DEXTROSE 1-4 GM/50ML-% IV SOLN
INTRAVENOUS | Status: AC
  Filled 2024-03-23: qty 50

## 2024-03-23 MED ORDER — DIPHENHYDRAMINE HCL 50 MG/ML IJ SOLN: 50.0000 mg | Freq: Once | INTRAMUSCULAR | Status: DC | PRN

## 2024-03-23 MED ORDER — MIDAZOLAM HCL 2 MG/ML PO SYRP: 8.0000 mg | ORAL_SOLUTION | Freq: Once | ORAL | Status: DC | PRN

## 2024-03-23 MED ORDER — SODIUM CHLORIDE 0.9 % IV SOLN: 250.0000 mL | INTRAVENOUS | Status: DC | PRN

## 2024-03-23 MED ORDER — SODIUM CHLORIDE 0.9% FLUSH
3.0000 mL | Freq: Two times a day (BID) | INTRAVENOUS | Status: DC
  Administered 2024-03-23 – 2024-03-24 (×2): 3 mL via INTRAVENOUS

## 2024-03-23 MED ORDER — METOPROLOL SUCCINATE ER 25 MG PO TB24
12.5000 mg | ORAL_TABLET | Freq: Every day | ORAL | Status: DC
  Administered 2024-03-24 – 2024-03-29 (×6): 12.5 mg via ORAL
  Filled 2024-03-23 (×3): qty 1
  Filled 2024-03-23: qty 0.5
  Filled 2024-03-23 (×2): qty 1

## 2024-03-23 MED ORDER — INSULIN ASPART 100 UNIT/ML IJ SOLN
0.0000 [IU] | Freq: Three times a day (TID) | INTRAMUSCULAR | Status: DC
Start: 2024-03-23 — End: 2024-03-29
  Administered 2024-03-25 (×2): 3 [IU] via SUBCUTANEOUS
  Administered 2024-03-26: 5 [IU] via SUBCUTANEOUS
  Administered 2024-03-26: 3 [IU] via SUBCUTANEOUS
  Administered 2024-03-27 (×2): 2 [IU] via SUBCUTANEOUS
  Administered 2024-03-28 (×2): 3 [IU] via SUBCUTANEOUS
  Administered 2024-03-28: 2 [IU] via SUBCUTANEOUS
  Administered 2024-03-29: 3 [IU] via SUBCUTANEOUS
  Filled 2024-03-23 (×10): qty 1

## 2024-03-23 MED ORDER — SODIUM CHLORIDE 0.9% FLUSH: 3.0000 mL | INTRAVENOUS | Status: DC | PRN

## 2024-03-23 MED ORDER — CHLORHEXIDINE GLUCONATE 4 % EX SOLN
60.0000 mL | Freq: Once | CUTANEOUS | Status: AC
  Administered 2024-03-23: 4 via TOPICAL

## 2024-03-23 MED ORDER — ATORVASTATIN CALCIUM 20 MG PO TABS
40.0000 mg | ORAL_TABLET | Freq: Every day | ORAL | Status: DC
  Administered 2024-03-23 – 2024-03-28 (×6): 40 mg via ORAL
  Filled 2024-03-23 (×6): qty 2

## 2024-03-23 MED ORDER — MIDAZOLAM HCL 2 MG/2ML IJ SOLN
INTRAMUSCULAR | Status: DC | PRN
  Administered 2024-03-23: 1 mg via INTRAVENOUS

## 2024-03-23 MED ORDER — SODIUM CHLORIDE 0.9 % IV SOLN: INTRAVENOUS | Status: AC

## 2024-03-23 MED ORDER — ACETAMINOPHEN 500 MG PO TABS
1000.0000 mg | ORAL_TABLET | Freq: Four times a day (QID) | ORAL | Status: DC | PRN
  Administered 2024-03-24: 1000 mg via ORAL
  Filled 2024-03-23: qty 2

## 2024-03-23 MED ORDER — METHYLPREDNISOLONE SODIUM SUCC 125 MG IJ SOLR: 125.0000 mg | Freq: Once | INTRAMUSCULAR | Status: DC | PRN

## 2024-03-23 MED ORDER — CEFAZOLIN SODIUM-DEXTROSE 2-4 GM/100ML-% IV SOLN
INTRAVENOUS | Status: AC
  Filled 2024-03-23: qty 100

## 2024-03-23 MED ORDER — AMLODIPINE BESYLATE 10 MG PO TABS
10.0000 mg | ORAL_TABLET | Freq: Every day | ORAL | Status: DC
  Administered 2024-03-24 – 2024-03-29 (×5): 10 mg via ORAL
  Filled 2024-03-23 (×4): qty 1
  Filled 2024-03-23: qty 2
  Filled 2024-03-23: qty 1

## 2024-03-23 MED ORDER — PENTAFLUOROPROP-TETRAFLUOROETH EX AERO: 1.0000 | INHALATION_SPRAY | CUTANEOUS | Status: DC | PRN

## 2024-03-23 MED ORDER — GABAPENTIN 100 MG PO CAPS
200.0000 mg | ORAL_CAPSULE | Freq: Two times a day (BID) | ORAL | Status: DC
  Administered 2024-03-23 – 2024-03-29 (×12): 200 mg via ORAL
  Filled 2024-03-23 (×12): qty 2

## 2024-03-23 MED ORDER — CHLORHEXIDINE GLUCONATE 4 % EX SOLN: 60.0000 mL | Freq: Once | CUTANEOUS | Status: DC

## 2024-03-23 MED ORDER — INSULIN ASPART 100 UNIT/ML IJ SOLN: 0.0000 [IU] | Freq: Every day | INTRAMUSCULAR | Status: DC

## 2024-03-23 MED ORDER — CLONIDINE HCL 0.1 MG PO TABS: 0.2000 mg | ORAL_TABLET | ORAL | Status: DC | PRN

## 2024-03-23 MED ORDER — HYDROMORPHONE HCL 1 MG/ML IJ SOLN: 1.0000 mg | Freq: Once | INTRAMUSCULAR | Status: DC | PRN

## 2024-03-23 MED ORDER — CHLORHEXIDINE GLUCONATE CLOTH 2 % EX PADS
6.0000 | MEDICATED_PAD | Freq: Every day | CUTANEOUS | Status: DC
  Administered 2024-03-24 – 2024-03-29 (×7): 6 via TOPICAL

## 2024-03-23 SURGICAL SUPPLY — 14 items
CATH ANGIO 5F PIGTAIL 65CM (CATHETERS) IMPLANT
COVER PROBE ULTRASOUND 5X96 (MISCELLANEOUS) IMPLANT
DEVICE STARCLOSE SE CLOSURE (Vascular Products) IMPLANT
GLIDEWIRE ADV .035X260CM (WIRE) IMPLANT
GOWN STRL REUS W/ TWL LRG LVL3 (GOWN DISPOSABLE) ×1 IMPLANT
NDL ENTRY 21GA 7CM ECHOTIP (NEEDLE) IMPLANT
NEEDLE ENTRY 21GA 7CM ECHOTIP (NEEDLE) ×1 IMPLANT
PACK ANGIOGRAPHY (CUSTOM PROCEDURE TRAY) ×1 IMPLANT
SET INTRO CAPELLA COAXIAL (SET/KITS/TRAYS/PACK) IMPLANT
SHEATH BRITE TIP 5FRX11 (SHEATH) IMPLANT
SUT MNCRL AB 4-0 PS2 18 (SUTURE) IMPLANT
SYR MEDRAD MARK 7 150ML (SYRINGE) IMPLANT
TUBING CONTRAST HIGH PRESS 72 (TUBING) IMPLANT
WIRE J 3MM .035X145CM (WIRE) IMPLANT

## 2024-03-23 NOTE — Telephone Encounter (Signed)
 I contacted by phone.  I discussed the findings at angiography this morning and the need for surgical endarterectomy of the common femoral artery with the hope for stenting of the iliac as well as the distal SFA.  I also did discuss the possibility of a femoral-femoral bypass.  We discussed the fact that he has tissue loss of his right foot and rest pain symptoms that this is the option for healing.  I do not believe stand-alone intervention given the occlusion of the common femoral would work let alone be a durable beneficial result.  The risks and benefits of surgery were reviewed all questions were answered [name] asked that we check with [name] and if he agrees [name] is in full agreement as well.  I did discuss the potential that the second case does not show up again in which case we would move forward with [name]'s surgery tomorrow afternoon.  [name] agreed with this and just wanted us  to let her know once we are certain that we are moving forward so that [name] can make appropriate arrangements to be here.

## 2024-03-23 NOTE — Progress Notes (Signed)
 Dr. Prescilla Brod at bedside, speaking with pt. Sister Loman Risk re: procedural results and need for surgery. Sister verbalized understanding of conversation.

## 2024-03-23 NOTE — Progress Notes (Addendum)
 Labs resulted now: noted Creatinine level 4.85. Called MD & made aware. Dr. Prescilla Brod then came to bedside and spoke with sister Arvel Birch (in person)& Sister POA Archibald Beard (via phone) re: need for leg procedure today and necessity of keeping pt. Overnight & need for future dialysis. Discovered pt. Has a pre-existing fistula in Left upper arm . Pt. States he has only had dialysis once and that dialysis was stopped. MD aware. All parties agreed verbally to move on with leg procedure at this point for pt. Benefit. Called [facility] to inform facility that pt. Would be staying in the hospital after his procedure.

## 2024-03-23 NOTE — Interval H&P Note (Signed)
 History and Physical Interval Note:  03/23/2024 9:49 AM  [name]  has presented today for surgery, with the diagnosis of RLE Angio   ASO w ulceration [facility] [employer].  The various methods of treatment have been discussed with the patient and family. After consideration of risks, benefits and other options for treatment, the patient has consented to  Procedure(s): Lower Extremity Angiography (Right) as a surgical intervention.  The patient's history has been reviewed, patient examined, no change in status, stable for surgery.  I have reviewed the patient's chart and labs.  Questions were answered to the patient's satisfaction.     [name]

## 2024-03-23 NOTE — Plan of Care (Signed)
   Problem: Education: Goal: Knowledge of General Education information will improve Description: Including pain rating scale, medication(s)/side effects and non-pharmacologic comfort measures Outcome: Progressing   Problem: Health Behavior/Discharge Planning: Goal: Ability to manage health-related needs will improve Outcome: Progressing   Problem: Clinical Measurements: Goal: Will remain free from infection Outcome: Progressing
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
## 2024-03-23 NOTE — Progress Notes (Addendum)
 Approximately 1500--Pt arrived to room 128A from PACU. Upon arrival, pt transferred from bed to stretcher by staff. VS obtained and telemetry connected and notified. Fall precautions in place.  Upon assessment, L femoral gauze site has new bloody drainage. No presence of hematoma. Drainage site marked. NP Pace notified and assessed pt at bedside.
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
## 2024-03-23 NOTE — Op Note (Signed)
 [facility]  Percutaneous Study/Intervention Procedural Note   Date of Surgery: 03/23/2024,11:35 AM  Surgeon:Ashwath Lasch, Ninette Basque   Pre-operative Diagnosis: Atherosclerotic occlusive disease bilateral lower extremities with ulceration of the right foot  Post-operative diagnosis:  Same  Procedure(s) Performed:  1.  Abdominal aortogram  2.  Selective injection of the right lower extremity second order catheter placement  3.  Ultrasound-guided access to the main common femoral artery  4.  StarClose left femoral artery    Anesthesia: Conscious sedation was administered by the interventional radiology RN under my direct supervision. IV Versed  plus fentanyl  were utilized. Continuous ECG, pulse oximetry and blood pressure was monitored throughout the entire procedure.  Conscious sedation was administered for a total of 25 minutes and 20 seconds.  Sheath: 5 French 11 cm Pinnacle sheath retrograde left common femoral  Contrast: 35 cc   Fluoroscopy Time: 3.2 minutes  Indications:  The patient presents to [facility] with atherosclerotic occlusive disease bilateral lower extremities with ulceration of the right.  Pedal pulses are nonpalpable bilaterally suggesting hemodynamically significant atherosclerotic occlusive disease.  This places him at extremely high risk for limb loss.  I did discuss with the patient as well as both his sisters 1 of whom is the power of attorney that exposure to contrast places him at high risk for renal damage and dialysis given his markedly diminished kidney function.  I explained the choices of doing nothing would likely lead to amputation but that we would do what we could do to minimize contrast exposure and that they were really were no other opportunities available here at [place].  I answered all their questions the patient as well as the sisters wished for us  to proceed understanding that contrast exposure presents the risk for renal  failure.  Of note the patient has already had dialysis for a short period in the past and already has a functioning fistula.  The risks and benefits as well as alternative therapies for lower extremity revascularization are reviewed with the patient all questions are answered the patient agrees to proceed.  The patient is therefore undergoing angiography with the hope for intervention for limb salvage.   Procedure:  RIGHTEOUS CHIASSON a 63 y.o. male who was identified and appropriate procedural time out was performed.  The patient was then placed supine on the table and prepped and draped in the usual sterile fashion.  Ultrasound was used to evaluate the left common femoral artery.  It was echolucent and pulsatile indicating it is patent .  An ultrasound image was acquired for the permanent record.  A micropuncture needle was used to access the left common femoral artery under direct ultrasound guidance.  The microwire was then advanced under fluoroscopic guidance without difficulty followed by the micro-sheath.  A 0.035 J wire was advanced without resistance and a 5Fr sheath was placed.    Pigtail catheter was then advanced to the level of T12 and AP projection of the aorta was obtained. Pigtail catheter was then repositioned to above the bifurcation and LAO view of the pelvis was obtained. Stiff angled Glidewire and pigtail catheter was then used across the bifurcation and the catheter was positioned in the distal internal iliac artery.  Distal runoff was then performed.  After review of the images the catheter was removed over wire and an LAO view of the groin was obtained. StarClose device was deployed without difficulty.   Findings:   Aortogram: The abdominal aorta is opacified with a bolus injection  contrast.  Single renal arteries are noted bilaterally with normal nephrograms.  No evidence of hemodynamically significant renal artery stenosis.  There are no hemodynamically significant stenoses  identified within the aorta.  The aortic bifurcation is mildly diseased but widely patent.  Bilateral common internal arteries are patent they are slightly enlarged but do not appear truly aneurysmal.  They are densely calcified.  The internal iliac arteries are heavily diseased bilaterally.  The right external iliac artery is occluded from its origin down to the common femoral (common femoral is also occluded in its entirety).  On the left the external iliac artery demonstrates a greater than 70% stenosis in its proximal one third distally it is diffusely diseased but patent.  Right lower Extremity: The right common femoral artery is occluded throughout its entire length, profunda femoris is patent.  The superficial femoral demonstrates a focal 70% stenosis distally and popliteal arteries is patent.  The trifurcation is patent but the peroneal artery occludes.  He appears to have two-vessel runoff via the anterior tibial and posterior tibial to the ankle.  There is poor filling of the foot but this injection is from the internal iliac artery and I am trying to avoid excessive contrast exposure.    SUMMARY: Based on these images no intervention is performed at this time.  Patient will need a right femoral endarterectomy with revascularization of the external iliac if this is not feasible then a femorofemoral bypass with stenting of the left external iliac will be performed.  At this time the distal SFA lesion can also be treated.    Disposition: Patient was taken to the recovery room in stable condition having tolerated the procedure well.  Theotis Flake Silvester Reierson 03/23/2024,11:35 AM

## 2024-03-24 ENCOUNTER — Observation Stay: Admitting: Certified Registered"

## 2024-03-24 ENCOUNTER — Other Ambulatory Visit: Payer: Self-pay

## 2024-03-24 ENCOUNTER — Encounter
Admission: RE | Disposition: A | Discharge status: Skilled Nursing Facility | Payer: Self-pay | Source: Home / Self Care | Attending: Vascular Surgery

## 2024-03-24 ENCOUNTER — Encounter: Payer: Self-pay | Admitting: Vascular Surgery

## 2024-03-24 ENCOUNTER — Observation Stay

## 2024-03-24 DIAGNOSIS — Z79899 Other long term (current) drug therapy: Secondary | ICD-10-CM | POA: Diagnosis not present

## 2024-03-24 DIAGNOSIS — H547 Unspecified visual loss: Secondary | ICD-10-CM | POA: Diagnosis present

## 2024-03-24 DIAGNOSIS — Y838 Other surgical procedures as the cause of abnormal reaction of the patient, or of later complication, without mention of misadventure at the time of the procedure: Secondary | ICD-10-CM | POA: Diagnosis not present

## 2024-03-24 DIAGNOSIS — G8220 Paraplegia, unspecified: Secondary | ICD-10-CM | POA: Diagnosis present

## 2024-03-24 DIAGNOSIS — Z87440 Personal history of urinary (tract) infections: Secondary | ICD-10-CM | POA: Diagnosis not present

## 2024-03-24 DIAGNOSIS — I129 Hypertensive chronic kidney disease with stage 1 through stage 4 chronic kidney disease, or unspecified chronic kidney disease: Secondary | ICD-10-CM | POA: Diagnosis present

## 2024-03-24 DIAGNOSIS — I70299 Other atherosclerosis of native arteries of extremities, unspecified extremity: Secondary | ICD-10-CM

## 2024-03-24 DIAGNOSIS — L7622 Postprocedural hemorrhage and hematoma of skin and subcutaneous tissue following other procedure: Secondary | ICD-10-CM | POA: Diagnosis not present

## 2024-03-24 DIAGNOSIS — F17210 Nicotine dependence, cigarettes, uncomplicated: Secondary | ICD-10-CM | POA: Diagnosis present

## 2024-03-24 DIAGNOSIS — Z7982 Long term (current) use of aspirin: Secondary | ICD-10-CM | POA: Diagnosis not present

## 2024-03-24 DIAGNOSIS — I70202 Unspecified atherosclerosis of native arteries of extremities, left leg: Secondary | ICD-10-CM

## 2024-03-24 DIAGNOSIS — Z794 Long term (current) use of insulin: Secondary | ICD-10-CM | POA: Diagnosis not present

## 2024-03-24 DIAGNOSIS — Z9889 Other specified postprocedural states: Secondary | ICD-10-CM

## 2024-03-24 DIAGNOSIS — Z970 Presence of artificial eye: Secondary | ICD-10-CM | POA: Diagnosis not present

## 2024-03-24 DIAGNOSIS — I70221 Atherosclerosis of native arteries of extremities with rest pain, right leg: Secondary | ICD-10-CM | POA: Diagnosis present

## 2024-03-24 DIAGNOSIS — I70235 Atherosclerosis of native arteries of right leg with ulceration of other part of foot: Secondary | ICD-10-CM | POA: Diagnosis not present

## 2024-03-24 DIAGNOSIS — L97909 Non-pressure chronic ulcer of unspecified part of unspecified lower leg with unspecified severity: Secondary | ICD-10-CM

## 2024-03-24 DIAGNOSIS — N179 Acute kidney failure, unspecified: Secondary | ICD-10-CM | POA: Diagnosis present

## 2024-03-24 DIAGNOSIS — N184 Chronic kidney disease, stage 4 (severe): Secondary | ICD-10-CM | POA: Diagnosis present

## 2024-03-24 DIAGNOSIS — E785 Hyperlipidemia, unspecified: Secondary | ICD-10-CM | POA: Diagnosis present

## 2024-03-24 DIAGNOSIS — L97519 Non-pressure chronic ulcer of other part of right foot with unspecified severity: Secondary | ICD-10-CM

## 2024-03-24 DIAGNOSIS — E11621 Type 2 diabetes mellitus with foot ulcer: Secondary | ICD-10-CM

## 2024-03-24 DIAGNOSIS — E1151 Type 2 diabetes mellitus with diabetic peripheral angiopathy without gangrene: Secondary | ICD-10-CM | POA: Diagnosis present

## 2024-03-24 DIAGNOSIS — D631 Anemia in chronic kidney disease: Secondary | ICD-10-CM | POA: Diagnosis present

## 2024-03-24 DIAGNOSIS — L97419 Non-pressure chronic ulcer of right heel and midfoot with unspecified severity: Secondary | ICD-10-CM | POA: Diagnosis present

## 2024-03-24 DIAGNOSIS — E1122 Type 2 diabetes mellitus with diabetic chronic kidney disease: Secondary | ICD-10-CM | POA: Diagnosis present

## 2024-03-24 HISTORY — PX: ENDARTERECTOMY FEMORAL: SHX5804

## 2024-03-24 HISTORY — PX: ENDOVASCULAR STENT GRAFT (AAA): CATH118280

## 2024-03-24 HISTORY — PX: INSERTION OF ILIAC STENT: SHX6256

## 2024-03-24 HISTORY — PX: APPLICATION OF CELL SAVER: SHX7529

## 2024-03-24 LAB — BASIC METABOLIC PANEL WITH GFR
Anion gap: 12 (ref 5–15)
BUN: 63 mg/dL — ABNORMAL HIGH (ref 8–23)
CO2: 18 mmol/L — ABNORMAL LOW (ref 22–32)
Calcium: 8.1 mg/dL — ABNORMAL LOW (ref 8.9–10.3)
Chloride: 107 mmol/L (ref 98–111)
Creatinine, Ser: 4.28 mg/dL — ABNORMAL HIGH (ref 0.61–1.24)
GFR, Estimated: 15 mL/min — ABNORMAL LOW (ref >60)
Glucose, Bld: 265 mg/dL — ABNORMAL HIGH (ref 70–99)
Potassium: 4.4 mmol/L (ref 3.5–5.1)
Sodium: 137 mmol/L (ref 135–145)

## 2024-03-24 LAB — COMPREHENSIVE METABOLIC PANEL WITH GFR
ALT: 13 U/L (ref 0–44)
AST: 16 U/L (ref 15–41)
Albumin: 2.9 g/dL — ABNORMAL LOW (ref 3.5–5.0)
Alkaline Phosphatase: 43 U/L (ref 38–126)
Anion gap: 11 (ref 5–15)
BUN: 60 mg/dL — ABNORMAL HIGH (ref 8–23)
CO2: 21 mmol/L — ABNORMAL LOW (ref 22–32)
Calcium: 8.6 mg/dL — ABNORMAL LOW (ref 8.9–10.3)
Chloride: 109 mmol/L (ref 98–111)
Creatinine, Ser: 4.29 mg/dL — ABNORMAL HIGH (ref 0.61–1.24)
GFR, Estimated: 15 mL/min — ABNORMAL LOW (ref >60)
Glucose, Bld: 76 mg/dL (ref 70–99)
Potassium: 4.3 mmol/L (ref 3.5–5.1)
Sodium: 141 mmol/L (ref 135–145)
Total Bilirubin: 0.5 mg/dL (ref 0.0–1.2)
Total Protein: 6.1 g/dL — ABNORMAL LOW (ref 6.5–8.1)

## 2024-03-24 LAB — CBC
HCT: 29.5 % — ABNORMAL LOW (ref 39.0–52.0)
HCT: 29.7 % — ABNORMAL LOW (ref 39.0–52.0)
Hemoglobin: 9.7 g/dL — ABNORMAL LOW (ref 13.0–17.0)
Hemoglobin: 9.7 g/dL — ABNORMAL LOW (ref 13.0–17.0)
MCH: 27.8 pg (ref 26.0–34.0)
MCH: 28.1 pg (ref 26.0–34.0)
MCHC: 32.7 g/dL (ref 30.0–36.0)
MCHC: 32.9 g/dL (ref 30.0–36.0)
MCV: 85.1 fL (ref 80.0–100.0)
MCV: 85.5 fL (ref 80.0–100.0)
Platelets: 191 10*3/uL (ref 150–400)
Platelets: 198 10*3/uL (ref 150–400)
RBC: 3.45 MIL/uL — ABNORMAL LOW (ref 4.22–5.81)
RBC: 3.49 MIL/uL — ABNORMAL LOW (ref 4.22–5.81)
RDW: 14.2 % (ref 11.5–15.5)
RDW: 14.2 % (ref 11.5–15.5)
WBC: 10.5 10*3/uL (ref 4.0–10.5)
WBC: 5.3 10*3/uL (ref 4.0–10.5)
nRBC: 0 % (ref 0.0–0.2)
nRBC: 0 % (ref 0.0–0.2)

## 2024-03-24 LAB — GLUCOSE, CAPILLARY
Glucose-Capillary: 79 mg/dL (ref 70–99)
Glucose-Capillary: 80 mg/dL (ref 70–99)
Glucose-Capillary: 110 mg/dL — ABNORMAL HIGH (ref 70–99)
Glucose-Capillary: 124 mg/dL — ABNORMAL HIGH (ref 70–99)
Glucose-Capillary: 140 mg/dL — ABNORMAL HIGH (ref 70–99)

## 2024-03-24 LAB — MRSA NEXT GEN BY PCR, NASAL: MRSA by PCR Next Gen: NOT DETECTED

## 2024-03-24 SURGERY — ENDARTERECTOMY, FEMORAL
Anesthesia: General | Laterality: Right

## 2024-03-24 MED ORDER — VISTASEAL 10 ML SINGLE DOSE KIT
PACK | CUTANEOUS | Status: DC | PRN
  Administered 2024-03-24: 4 mL via TOPICAL

## 2024-03-24 MED ORDER — CEFAZOLIN SODIUM-DEXTROSE 2-4 GM/100ML-% IV SOLN
2.0000 g | Freq: Three times a day (TID) | INTRAVENOUS | Status: AC
  Administered 2024-03-24 – 2024-03-25 (×2): 2 g via INTRAVENOUS
  Filled 2024-03-24 (×2): qty 100

## 2024-03-24 MED ORDER — SUGAMMADEX SODIUM 200 MG/2ML IV SOLN
INTRAVENOUS | Status: DC | PRN
  Administered 2024-03-24: 200 mg via INTRAVENOUS

## 2024-03-24 MED ORDER — GENTAMICIN SULFATE 40 MG/ML IJ SOLN
INTRAMUSCULAR | Status: AC
  Filled 2024-03-24: qty 2

## 2024-03-24 MED ORDER — LACTATED RINGERS IV SOLN: INTRAVENOUS | Status: DC | PRN

## 2024-03-24 MED ORDER — LIDOCAINE HCL (CARDIAC) PF 100 MG/5ML IV SOSY
PREFILLED_SYRINGE | INTRAVENOUS | Status: DC | PRN
  Administered 2024-03-24: 100 mg via INTRAVENOUS

## 2024-03-24 MED ORDER — PROPOFOL 10 MG/ML IV BOLUS
INTRAVENOUS | Status: AC
Start: 2024-03-24 — End: ?
  Filled 2024-03-24: qty 20

## 2024-03-24 MED ORDER — CEFAZOLIN SODIUM 1 G IJ SOLR
INTRAMUSCULAR | Status: AC
  Filled 2024-03-24: qty 10

## 2024-03-24 MED ORDER — SORBITOL 70 % SOLN: 30.0000 mL | Freq: Every day | Status: DC | PRN

## 2024-03-24 MED ORDER — STERILE WATER FOR IRRIGATION IR SOLN: Status: DC | PRN

## 2024-03-24 MED ORDER — KETAMINE HCL 50 MG/5ML IJ SOSY
PREFILLED_SYRINGE | INTRAMUSCULAR | Status: DC | PRN
Start: 2024-03-24 — End: 2024-03-24
  Administered 2024-03-24: 10 mg via INTRAVENOUS
  Administered 2024-03-24 (×2): 20 mg via INTRAVENOUS

## 2024-03-24 MED ORDER — LIDOCAINE HCL (PF) 2 % IJ SOLN
INTRAMUSCULAR | Status: AC
  Filled 2024-03-24: qty 20

## 2024-03-24 MED ORDER — DOPAMINE-DEXTROSE 3.2-5 MG/ML-% IV SOLN: 3.0000 ug/kg/min | INTRAVENOUS | Status: DC

## 2024-03-24 MED ORDER — FENTANYL CITRATE (PF) 100 MCG/2ML IJ SOLN
INTRAMUSCULAR | Status: AC
  Filled 2024-03-24: qty 2

## 2024-03-24 MED ORDER — PROPOFOL 10 MG/ML IV BOLUS
INTRAVENOUS | Status: DC | PRN
  Administered 2024-03-24: 120 mg via INTRAVENOUS

## 2024-03-24 MED ORDER — GUAIFENESIN-DM 100-10 MG/5ML PO SYRP: 15.0000 mL | ORAL_SOLUTION | ORAL | Status: DC | PRN

## 2024-03-24 MED ORDER — FENTANYL CITRATE (PF) 100 MCG/2ML IJ SOLN
25.0000 ug | INTRAMUSCULAR | Status: DC | PRN
  Administered 2024-03-24 (×2): 25 ug via INTRAVENOUS

## 2024-03-24 MED ORDER — LIDOCAINE HCL (PF) 2 % IJ SOLN
INTRAMUSCULAR | Status: AC
  Filled 2024-03-24: qty 10

## 2024-03-24 MED ORDER — PHENOL 1.4 % MT LIQD: 1.0000 | OROMUCOSAL | Status: DC | PRN

## 2024-03-24 MED ORDER — HEMOSTATIC AGENTS (NO CHARGE) OPTIME
TOPICAL | Status: DC | PRN
Start: 2024-03-24 — End: 2024-03-24
  Administered 2024-03-24: 1 via TOPICAL

## 2024-03-24 MED ORDER — HEPARIN SODIUM (PORCINE) 1000 UNIT/ML IJ SOLN
INTRAMUSCULAR | Status: AC
  Filled 2024-03-24: qty 30

## 2024-03-24 MED ORDER — SODIUM CHLORIDE 0.9 % IV SOLN: INTRAVENOUS | Status: DC

## 2024-03-24 MED ORDER — BUPIVACAINE LIPOSOME 1.3 % IJ SUSP
INTRAMUSCULAR | Status: AC
  Filled 2024-03-24: qty 20

## 2024-03-24 MED ORDER — FENTANYL CITRATE (PF) 100 MCG/2ML IJ SOLN
INTRAMUSCULAR | Status: DC | PRN
  Administered 2024-03-24 (×2): 50 ug via INTRAVENOUS

## 2024-03-24 MED ORDER — KETAMINE HCL 50 MG/5ML IJ SOSY
PREFILLED_SYRINGE | INTRAMUSCULAR | Status: AC
  Filled 2024-03-24: qty 5

## 2024-03-24 MED ORDER — DOCUSATE SODIUM 100 MG PO CAPS
100.0000 mg | ORAL_CAPSULE | Freq: Every day | ORAL | Status: DC
  Administered 2024-03-25 – 2024-03-29 (×5): 100 mg via ORAL
  Filled 2024-03-24 (×5): qty 1

## 2024-03-24 MED ORDER — METOPROLOL TARTRATE 5 MG/5ML IV SOLN: 2.0000 mg | INTRAVENOUS | Status: DC | PRN

## 2024-03-24 MED ORDER — MIDAZOLAM HCL 2 MG/2ML IJ SOLN
INTRAMUSCULAR | Status: AC
  Filled 2024-03-24: qty 2

## 2024-03-24 MED ORDER — OXYCODONE HCL 5 MG/5ML PO SOLN: 5.0000 mg | Freq: Once | ORAL | Status: DC | PRN

## 2024-03-24 MED ORDER — SODIUM CHLORIDE 0.9 % IV SOLN: 500.0000 mL | Freq: Once | INTRAVENOUS | Status: DC | PRN

## 2024-03-24 MED ORDER — VASHE WOUND IRRIGATION OPTIME
TOPICAL | Status: DC | PRN
  Administered 2024-03-24: 34 [oz_av] via TOPICAL

## 2024-03-24 MED ORDER — ETOMIDATE 2 MG/ML IV SOLN
INTRAVENOUS | Status: AC
  Filled 2024-03-24: qty 10

## 2024-03-24 MED ORDER — SENNOSIDES-DOCUSATE SODIUM 8.6-50 MG PO TABS: 1.0000 | ORAL_TABLET | Freq: Every evening | ORAL | Status: DC | PRN

## 2024-03-24 MED ORDER — HEPARIN 30,000 UNITS/1000 ML (OHS) CELLSAVER SOLUTION
Status: AC | PRN
Start: 2024-03-24 — End: 2024-03-24
  Administered 2024-03-24: 1

## 2024-03-24 MED ORDER — HEPARIN SODIUM (PORCINE) 5000 UNIT/ML IJ SOLN
INTRAMUSCULAR | Status: AC
  Filled 2024-03-24: qty 1

## 2024-03-24 MED ORDER — SODIUM CHLORIDE 0.9 % IV SOLN
INTRAVENOUS | Status: DC | PRN
  Administered 2024-03-24: 501 mL via SURGICAL_CAVITY

## 2024-03-24 MED ORDER — HEPARIN SODIUM (PORCINE) 1000 UNIT/ML IJ SOLN
INTRAMUSCULAR | Status: DC | PRN
  Administered 2024-03-24: 2000 [IU] via INTRAVENOUS
  Administered 2024-03-24: 7000 [IU] via INTRAVENOUS

## 2024-03-24 MED ORDER — DEXAMETHASONE SODIUM PHOSPHATE 10 MG/ML IJ SOLN
INTRAMUSCULAR | Status: DC | PRN
  Administered 2024-03-24: 10 mg via INTRAVENOUS

## 2024-03-24 MED ORDER — CLOPIDOGREL BISULFATE 75 MG PO TABS
75.0000 mg | ORAL_TABLET | Freq: Every day | ORAL | Status: DC
  Administered 2024-03-25 – 2024-03-29 (×5): 75 mg via ORAL
  Filled 2024-03-24 (×5): qty 1

## 2024-03-24 MED ORDER — MAGNESIUM SULFATE 2 GM/50ML IV SOLN: 2.0000 g | Freq: Every day | INTRAVENOUS | Status: DC | PRN

## 2024-03-24 MED ORDER — POTASSIUM CHLORIDE CRYS ER 20 MEQ PO TBCR: 20.0000 meq | EXTENDED_RELEASE_TABLET | Freq: Every day | ORAL | Status: DC | PRN

## 2024-03-24 MED ORDER — ROCURONIUM BROMIDE 100 MG/10ML IV SOLN
INTRAVENOUS | Status: DC | PRN
  Administered 2024-03-24: 40 mg via INTRAVENOUS
  Administered 2024-03-24: 10 mg via INTRAVENOUS
  Administered 2024-03-24: 20 mg via INTRAVENOUS

## 2024-03-24 MED ORDER — NITROGLYCERIN IN D5W 200-5 MCG/ML-% IV SOLN: 5.0000 ug/min | INTRAVENOUS | Status: DC

## 2024-03-24 MED ORDER — OXYCODONE HCL 5 MG PO TABS: 5.0000 mg | ORAL_TABLET | Freq: Once | ORAL | Status: DC | PRN

## 2024-03-24 MED ORDER — HYDRALAZINE HCL 20 MG/ML IJ SOLN
5.0000 mg | INTRAMUSCULAR | Status: DC | PRN
  Administered 2024-03-25: 5 mg via INTRAVENOUS
  Filled 2024-03-24: qty 1

## 2024-03-24 MED ORDER — VANCOMYCIN HCL 1 G IV SOLR
INTRAVENOUS | Status: DC | PRN
  Administered 2024-03-24: 1000 mg

## 2024-03-24 MED ORDER — LABETALOL HCL 5 MG/ML IV SOLN
10.0000 mg | INTRAVENOUS | Status: DC | PRN
  Administered 2024-03-24 – 2024-03-25 (×2): 10 mg via INTRAVENOUS
  Filled 2024-03-24 (×2): qty 4

## 2024-03-24 MED ORDER — MIDAZOLAM HCL 2 MG/2ML IJ SOLN
INTRAMUSCULAR | Status: DC | PRN
  Administered 2024-03-24: 2 mg via INTRAVENOUS

## 2024-03-24 MED ORDER — BUPIVACAINE HCL (PF) 0.5 % IJ SOLN
INTRAMUSCULAR | Status: AC
  Filled 2024-03-24: qty 30

## 2024-03-24 MED ORDER — CEFAZOLIN SODIUM-DEXTROSE 1-4 GM/50ML-% IV SOLN
INTRAVENOUS | Status: DC | PRN
  Administered 2024-03-24: 2 g via INTRAVENOUS

## 2024-03-24 MED ORDER — GENTAMICIN SULFATE 40 MG/ML IJ SOLN
INTRAMUSCULAR | Status: DC | PRN
  Administered 2024-03-24: 80 mg via INTRAMUSCULAR

## 2024-03-24 MED ORDER — ONDANSETRON HCL 4 MG/2ML IJ SOLN
INTRAMUSCULAR | Status: DC | PRN
  Administered 2024-03-24: 4 mg via INTRAVENOUS

## 2024-03-24 MED ORDER — VANCOMYCIN HCL 1000 MG IV SOLR
INTRAVENOUS | Status: AC
  Filled 2024-03-24: qty 20

## 2024-03-24 MED ORDER — ALUM & MAG HYDROXIDE-SIMETH 200-200-20 MG/5ML PO SUSP: 15.0000 mL | ORAL | Status: DC | PRN

## 2024-03-24 MED ORDER — VISTASEAL 4 ML SINGLE DOSE KIT
PACK | CUTANEOUS | Status: AC
  Filled 2024-03-24: qty 4

## 2024-03-24 MED ORDER — PHENYLEPHRINE HCL-NACL 20-0.9 MG/250ML-% IV SOLN
INTRAVENOUS | Status: DC | PRN
  Administered 2024-03-24: 30 ug/min via INTRAVENOUS
  Administered 2024-03-24: 40 ug/min via INTRAVENOUS

## 2024-03-24 MED ORDER — FAMOTIDINE IN NACL 20-0.9 MG/50ML-% IV SOLN
20.0000 mg | Freq: Two times a day (BID) | INTRAVENOUS | Status: DC
  Administered 2024-03-24: 20 mg via INTRAVENOUS
  Filled 2024-03-24: qty 50

## 2024-03-24 SURGICAL SUPPLY — 61 items
BAG DECANTER FOR FLEXI CONT (MISCELLANEOUS) ×2 IMPLANT
BLADE SURG 15 STRL LF DISP TIS (BLADE) ×2 IMPLANT
BLADE SURG SZ11 CARB STEEL (BLADE) ×2 IMPLANT
BRUSH SCRUB EZ 4% CHG (MISCELLANEOUS) ×2 IMPLANT
CHLORAPREP W/TINT 26 (MISCELLANEOUS) ×2 IMPLANT
CLAMP SUTURE YELLOW 5 PAIRS (MISCELLANEOUS) ×4 IMPLANT
CLEANSER WND VASHE 34 (WOUND CARE) ×2 IMPLANT
DEVICE INFLATION ATRION QL4015 (MISCELLANEOUS) IMPLANT
DEVICE STARCLOSE SE CLOSURE (Vascular Products) IMPLANT
DRAPE INCISE IOBAN 66X45 STRL (DRAPES) ×2 IMPLANT
DRESSING SURGICEL FIBRLLR 1X2 (HEMOSTASIS) ×2 IMPLANT
DRSG OPSITE POSTOP 4X6 (GAUZE/BANDAGES/DRESSINGS) IMPLANT
DRSG TEGADERM 4X4.75 (GAUZE/BANDAGES/DRESSINGS) IMPLANT
ELECTRODE REM PT RTRN 9FT ADLT (ELECTROSURGICAL) ×2 IMPLANT
GAUZE SPONGE 2X2 8PLY STRL LF (GAUZE/BANDAGES/DRESSINGS) IMPLANT
GLOVE BIO SURGEON STRL SZ7 (GLOVE) ×4 IMPLANT
GLOVE SURG SYN 7.0 (GLOVE) ×2 IMPLANT
GLOVE SURG SYN 7.0 PF PI (GLOVE) ×2 IMPLANT
GLOVE SURG SYN 8.0 (GLOVE) ×2 IMPLANT
GLOVE SURG SYN 8.0 PF PI (GLOVE) ×2 IMPLANT
GOWN STRL REUS W/ TWL LRG LVL3 (GOWN DISPOSABLE) ×4 IMPLANT
GOWN STRL REUS W/ TWL XL LVL3 (GOWN DISPOSABLE) ×2 IMPLANT
GOWN STRL REUS W/TWL 2XL LVL3 (GOWN DISPOSABLE) ×2 IMPLANT
GRAFT VASC PATCH XENOSURE 1X14 (Vascular Products) IMPLANT
IV NS 500ML BAXH (IV SOLUTION) ×2 IMPLANT
KIT STIMULAN RAPID CURE 5CC (Orthopedic Implant) IMPLANT
KIT TURNOVER KIT A (KITS) ×2 IMPLANT
LABEL OR SOLS (LABEL) ×2 IMPLANT
LOOP VESSEL MAXI 1X406 RED (MISCELLANEOUS) ×6 IMPLANT
LOOP VESSEL MINI 0.8X406 BLUE (MISCELLANEOUS) ×8 IMPLANT
MANIFOLD NEPTUNE II (INSTRUMENTS) ×2 IMPLANT
NDL FILTER BLUNT 18X1 1/2 (NEEDLE) ×2 IMPLANT
NEEDLE FILTER BLUNT 18X1 1/2 (NEEDLE) ×2 IMPLANT
NS IRRIG 500ML POUR BTL (IV SOLUTION) ×2 IMPLANT
PACK BASIN MAJOR ARMC (MISCELLANEOUS) ×2 IMPLANT
PACK UNIVERSAL (MISCELLANEOUS) ×2 IMPLANT
SET WALTER ACTIVATION W/DRAPE (SET/KITS/TRAYS/PACK) ×4 IMPLANT
SPIKE FLUID TRANSFER (MISCELLANEOUS) ×2 IMPLANT
STAPLER SKIN PROX 35W (STAPLE) ×2 IMPLANT
STENT LIFESTAR 12X60 (Permanent Stent) IMPLANT
STENT LIFESTAR 14X60 (Permanent Stent) IMPLANT
STENT LIFESTENT 5F 5X60X135 (Permanent Stent) IMPLANT
STENT LIFESTENT 5F 6X60X135 (Permanent Stent) IMPLANT
STENT VIABAHN 9X10X120 (Permanent Stent) IMPLANT
SUT PROLENE 5 0 RB 1 DA (SUTURE) ×8 IMPLANT
SUT PROLENE 6 0 BV (SUTURE) ×12 IMPLANT
SUT PROLENE 7 0 BV 1 (SUTURE) ×8 IMPLANT
SUT SILK 2 0 SH (SUTURE) ×2 IMPLANT
SUT SILK 2-0 18XBRD TIE 12 (SUTURE) ×2 IMPLANT
SUT SILK 3-0 18XBRD TIE 12 (SUTURE) ×2 IMPLANT
SUT VIC AB 2-0 CT1 (SUTURE) ×4 IMPLANT
SUT VIC AB 2-0 CT1 TAPERPNT 27 (SUTURE) ×4 IMPLANT
SUT VIC AB 3-0 SH 27X BRD (SUTURE) ×4 IMPLANT
SUT VICRYL+ 3-0 36IN CT-1 (SUTURE) ×4 IMPLANT
SUTURE EHLN 3-0 FS-10 30 BLK (SUTURE) ×4 IMPLANT
SYR 5ML LL (SYRINGE) ×2 IMPLANT
SYR BULB IRRIG 60ML STRL (SYRINGE) ×2 IMPLANT
TRAP FLUID SMOKE EVACUATOR (MISCELLANEOUS) ×2 IMPLANT
TRAY FOLEY MTR SLVR 16FR STAT (SET/KITS/TRAYS/PACK) ×2 IMPLANT
WATER STERILE IRR 1000ML POUR (IV SOLUTION) ×2 IMPLANT
WATER STERILE IRR 500ML POUR (IV SOLUTION) ×2 IMPLANT

## 2024-03-24 NOTE — Progress Notes (Signed)
 [facility]  ROUNDING NOTE   Subjective:   Patient seen laying in bed No visitors present Room air Nonverbal NPO for procedure  Creatinine 4.29 Urine output 2.3L in past 24 hours   Objective:  Vital signs in last 24 hours:  Temp:  [97.4 F (36.3 C)-98.6 F (37 C)] 97.4 F (36.3 C) (04/30 1108) Pulse Rate:  [64-78] 64 (04/30 1108) Resp:  [16-18] 17 (04/30 1108) BP: (109-132)/(58-79) 109/58 (04/30 1108) SpO2:  [100 %] 100 % (04/30 1108) Weight:  [80.3 kg] 80.3 kg (04/30 1108)  Weight change:  Filed Weights   03/23/24 1037 03/23/24 1415 03/24/24 1108  Weight: 81.6 kg 74 kg 80.3 kg    Intake/Output: I/O last 3 completed shifts: In: 410.6 [P.O.:200; I.V.:210.6] Out: 2300 [Urine:2300]   Intake/Output this shift:  Total I/O In: 1643.4 [P.O.:50; I.V.:1457.6; IV Piggyback:135.9] Out: 550 [Urine:550]  Physical Exam: General: NAD  Head: Normocephalic, atraumatic. Moist oral mucosal membranes  Eyes: Anicteric  Lungs:  Clear to auscultation, normal effort  Heart: Regular rate and rhythm  Abdomen:  Soft, nontender  Extremities:  1+ peripheral edema.  Neurologic: Nonfocal, moving all four extremities  Skin: No lesions  Access: Lt upper AVF    Basic Metabolic Panel: Recent Labs  Lab 03/23/24 0932 03/23/24 1538 03/24/24 0526  NA  --   --  141  K  --   --  4.3  CL  --   --  109  CO2  --   --  21*  GLUCOSE  --   --  76  BUN 68*  --  60*  CREATININE 4.85* 4.69* 4.29*  CALCIUM   --   --  8.6*    Liver Function Tests: Recent Labs  Lab 03/24/24 0526  AST 16  ALT 13  ALKPHOS 43  BILITOT 0.5  PROT 6.1*  ALBUMIN 2.9*   No results for input(s): "LIPASE", "AMYLASE" in the last 168 hours. No results for input(s): "AMMONIA" in the last 168 hours.  CBC: Recent Labs  Lab 03/23/24 1538 03/24/24 0526  WBC 5.4 5.3  HGB 11.3* 9.7*  HCT 35.4* 29.7*  MCV 85.5 85.1  PLT 208 198    Cardiac Enzymes: No results for input(s): "CKTOTAL", "CKMB",  "CKMBINDEX", "TROPONINI" in the last 168 hours.  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    ANTIMICROBIAL SUSCEPTIBILITY Comment  Final    Comment:       ** S = Susceptible; I = Intermediate; R = Resistant **                    P = Positive; N = Negative             MICS are expressed in micrograms per mL    Antibiotic                 RSLT#1    RSLT#2    RSLT#3  RSLT#4 Amikacin                       S Cefepime                       S Ceftazidime                    S Ciprofloxacin                   S Gentamicin                     S Imipenem                       I Levofloxacin                   I Meropenem                      R Piperacillin                   S Ticarcillin                    R Tobramycin                     S     Coagulation Studies: No results for input(s): "LABPROT", "INR" in the last 72 hours.  Urinalysis: No results for input(s): "COLORURINE", "LABSPEC", "PHURINE", "GLUCOSEU", "HGBUR", "BILIRUBINUR", "KETONESUR", "PROTEINUR", "UROBILINOGEN", "NITRITE", "LEUKOCYTESUR" in the last 72 hours.  Invalid input(s): "APPERANCEUR"    Imaging: PERIPHERAL VASCULAR CATHETERIZATION Result Date: 03/23/2024 See surgical note for result.    Medications:    [MAR Hold] sodium chloride      sodium chloride  Stopped (03/24/24 1016)   heparin  30,000 units/NS 1000 mL solution for CELLSAVER      [MAR Hold] sodium chloride     Intravenous Once   [MAR Hold] amLODipine   10 mg Oral Daily   [MAR Hold] aspirin  EC  81 mg Oral Daily   [MAR Hold] atorvastatin   40 mg Oral QHS   chlorhexidine   60 mL Topical Once   [MAR Hold] Chlorhexidine  Gluconate Cloth  6 each Topical Q0600   [MAR Hold] feeding supplement  237 mL Oral BID BM   [MAR Hold] gabapentin   200 mg Oral BID   [MAR Hold] heparin   5,000 Units Subcutaneous Q8H   [MAR Hold] insulin aspart  0-15 Units Subcutaneous TID WC   [MAR Hold] insulin aspart  0-5 Units Subcutaneous QHS   [MAR Hold] metoprolol succinate  12.5 mg Oral Daily   [MAR Hold] oxyCODONE   10 mg Oral QHS   [MAR Hold] primidone   12.5 mg Oral BID   [MAR Hold] rOPINIRole   0.25 mg Oral QHS   [MAR Hold] senna-docusate  1 tablet Oral BID   [MAR Hold] sertraline  50 mg Oral Daily   [MAR Hold] sodium chloride  flush  3 mL Intravenous Q12H   [MAR Hold] sodium chloride , [MAR Hold] acetaminophen , [MAR Hold] cloNIDine, heparin  30,000 units/NS 1000 mL solution for CELLSAVER, [MAR Hold] heparin , [MAR Hold] lidocaine -prilocaine , [MAR Hold]  morphine injection, [MAR Hold] ondansetron  (ZOFRAN ) IV, [MAR Hold] oxyCODONE , [MAR Hold] pentafluoroprop-tetrafluoroeth, sodium chloride  0.9 % 500 mL with heparin  5,000 Units, [MAR Hold] sodium chloride  flush  Assessment/ Plan:  [name] is a 63 y.o.  male with a PMHx of peripheral vascular disease,  history of CVA, hypertension, diabetes mellitus type 2, BPH,, history of C7 cervical fracture, GERD, glaucoma, gout, hyperlipidemia, anemia chronic kidney disease, vitamin D  deficiency who was admitted to [facility] on 03/23/2024 for Atherosclerosis of artery of extremity with ulceration (HCC) [I70.299, [name]    Acute kidney injury/chronic kidney disease stage IV baseline creatinine 3.7 with EGFR 17 1/87/25/history of requiring hemodialysis.  Patient was previously requiring hemodialysis.  His baseline renal function showed a creatinine of 3.7 and EGFR 17 on 12/22/2023.  His EGFR now  is 13 and he just had contrast exposure.  Creatinine has improved some today and excellent urine output. Will proceed with short dialysis treatment today to clear IV contrast. No plans for further treatments at this time. If procedure completes late, will provide treatment on Thursday.   Lab Results  Component Value Date   CREATININE 4.29 (H) 03/24/2024   CREATININE 4.69 (H) 03/23/2024   CREATININE 4.85 (H) 03/23/2024    Intake/Output Summary (Last 24 hours) at 03/24/2024 1514 Last data filed at 03/24/2024 1422 Gross per 24 hour  Intake 2054 ml  Output 2350 ml  Net -296 ml   2. Anemia of chronic kidney disease Lab Results  Component Value Date   HGB 9.7 (L) 03/24/2024   Hgb within desired range.   3.  Hypertension with chronic kidney disease.  Maintain the patient on amlodipine  and clonidine for now. Blood pressure stable   4.  Peripheral vascular disease.  Patient underwent angiogram on 4/29.  Planned endarterectomy with stent placement today.    LOS: 0 Floyed Masoud 4/30/20253:14 PM

## 2024-03-24 NOTE — Op Note (Signed)
 OPERATIVE NOTE   PROCEDURE: Right common femoral, superficial femoral and profunda femoris endarterectomy with bovine pericardial patch angioplasty. Aortogram with right lower extremity angiography right femoral approach, left iliac angiography left femoral approach. Open angioplasty and stent placement right common iliac artery. Open angioplasty and stent placement right external iliac artery. Open angioplasty and stent placement right SFA and popliteal arteries in 2 locations. Ultrasound-guided access to the left common femoral artery. Percutaneous transluminal angioplasty and stent placement left external iliac artery  PRE-OPERATIVE DIAGNOSIS: Atherosclerotic occlusive disease with rest pain and ulceration of the right foot; diabetes mellitus  POST-OPERATIVE DIAGNOSIS: Same  CO-SURGEON: Jackquelyn Mass, MD and [employer], M.D.  ASSISTANT(S): None  ANESTHESIA: general  ESTIMATED BLOOD LOSS: 75 cc  FINDING(S): Profound calcific plaque noted of the right common femoral extending past the initial bifurcation of the profunda femoris arteries as well as down the extensive length of the SFA  SPECIMEN(S):  Calcific plaque from the common femoral, superficial femoral and the profunda femoris artery  INDICATIONS:   [name] 63 y.o. y.o.male who presents with complaints of nonhealing right foot ulcer and pain continuously in the right lower extremity. The patient has documented severe atherosclerotic occlusive disease and has undergone minimally invasive treatments in the past. However, at this point his primary area of stricture stenosis resides in the common femoral and origins of the superficial femoral and profunda femoris extending into these arteries and therefore this is not amenable to intervention. The patient is therefore undergoing open endarterectomy. The risks and benefits of surgery have been reviewed with the patient, all  questions have answered; alternative therapies have been reviewed as well and the patient has agreed to proceed with surgical open repair.  DESCRIPTION: After obtaining full informed written consent, the patient was brought back to the operating room and placed supine upon the operating table.  The patient received IV antibiotics prior to induction.  After obtaining adequate anesthesia, the patient was prepped and draped in the standard fashion for right femoral exposure.  Co-surgeons are required because this is a complicated procedure with work being performed simultaneously by both surgeons.  This expedites the procedure making a shorter operative time reducing complications and improving patient safety.  Attention was turned to the right groin with Dr. Vonna Guardian working on the patient's left and myself working on the right of the patient.  Vertical  Incision was made over the right common femoral artery and dissection carried down to the common femoral artery with electrocautery.  I dissected out the common femoral artery from the distal external iliac artery (identified by the superficial circumflex vessels) down to the femoral bifurcation.  On initial inspection, the common femoral artery was: densely calcified and there was no palpable pulse noted.    Subsequently the dissection was continued to include all circumflex branches and the profunda femoral artery and superficial femoral artery. The superficial femoral artery was dissected circumferentially for a distance of approximately 3-4 cm and the profunda femoris was dissected circumferentially out to the fourth order branches individual vessel loops were placed around each branch.  Control of all branches was obtained with vessel loops.  A softer area in the distal external iliac artery amendable to clamping was identified.    The patient was given 6000 units of Heparin  intravenously (later in the case he was given an additional 2000 units of heparin ),  which was a therapeutic bolus.   After waiting 3 minutes, the distal external iliac artery was clamped  and all of the vessel loops were placed under tension.  Arteriotomy was made in the common femoral artery with a 11-blade and extended it with a Potts scissor proximally and distally extending the distal end down the SFA for approximately 3 cm.   Endarterectomy was then performed under direct visualization using a freer elevator and a right angle from the mid common femoral extending up both proximally and distally. Proximally the endarterectomy was brought up to the level of the clamp where a clean edge was obtained. Distally the endarterectomy was carried down to a soft spot in the SFA where a feathered edge would was obtained.  7-0 Prolene interrupted tacking sutures were placed to secure the leading edge of the plaque in the SFA.  The profunda femoris was treated with an extensive eversion technique extending endarterectomy approximately 3 cm distally again obtaining a featheredge on the right side.   At this point, a bovine pericardial patch was fashioned for the geometry of the arteriotomy.  The pericardial patch was sewn to the artery with 2 running stitches of 6-0 Prolene, running from each end.  The medial suture line was completed.  The lateral suture line was not completed, a gap was left in the suture line to allow for placement of the sheath and 2 interrupted 6-0 Prolene sutures were used to secure the running 6-0 Prolene sutures on each end of the.  At this point we began the interventional portion of the case.  Using a J-wire and an 8 [place] sheath I inserted this through the gap in the patch suture line.  I was then able to negotiate a stiff angled Glidewire and a Kumpe catheter through the external iliac artery occlusion and into the aorta.  Injection of contrast demonstrated intraluminal positioning and confirmed occlusion of the external iliac artery with hemodynamically significant  disease at the iliac bifurcation including the distal common iliac artery.  A super core wire was then advanced through the Kumpe catheter and the Kumpe catheter removed.  Beginning with a 14 mm x 60 mm life star stent this was advanced so the leading edge was within the common iliac artery it was then deployed extending distally into the external iliac artery.  It was then postdilated with an 8 mm x 60 mm Lutonix drug-eluting balloon inflated to 10 atm for approximately 1 minute.  Using the balloon to measure the remaining distance of occluded external iliac artery I then selected a 9 mm x 100 mm Viabahn stent and deployed this down to the level of the circumflex branches.  This was then postdilated with an 8 mm x 100 mm [place] balloon and inflated to approximately 15 atm for 1 minute.  Follow-up imaging done demonstrated wide patency of the common iliac as well as the external iliac arteries with less than 10% residual stenosis.  I was the primary surgeon on the right side and Dr. Vonna Guardian assisted me.  We then turned to the left side where Dr. Vonna Guardian is the primary surgeon and I first assisted.  Ultrasound was delivered onto the field in a sterile sleeve.  The left common iliac artery was then imaged with ultrasound.  It was echolucent pulsatile indicating patency.  Images recorded for the permanent record.  Under direct ultrasound visualization a Seldinger needle was inserted followed by a J-wire and then a 6 [place] sheath.  Hand-injection contrast was then used to demonstrate the external iliac anatomy and the hemodynamically significant stenosis in the proximal one third.  A  12 mm x 60 mm life star stent was then selected and deployed with its leading edge at the origin of the external iliac artery extending distally.  It was then postdilated with an 8 mm x 60 mm Lutonix drug-eluting balloon inflated to 10 to 12 atm for approximately 1 minute.  Follow-up imaging demonstrated less than 10% residual stenosis.  Distal  runoff was preserved.  An LAO projection of the left common femoral artery was then obtained and a StarClose device deployed without difficulty.  Attention was now returned to the right side where the 8 French sheath was removed and then replaced in a antegrade direction extending into the SFA.  Again the gap in the patch suture line was utilized.  Hand-injection contrast then demonstrated the right lower extremity anatomy.  In the mid SFA a greater than 90% stenosis was identified.  In the mid popliteal a greater than 70% stenosis was noted.  Two-vessel runoff to the foot was noted anterior tibial being the dominant vessel.  I was the primary surgeon and Dr. Vonna Guardian assisted me on this portion.  I selected a 6 mm x 60 mm life stent and deployed this across the mid SFA lesion.  It was then postdilated with a 5 mm x 60 mm Lutonix drug-eluting balloon inflated to 10 atm for approximately 1 minute.  I then treated the popliteal lesion with a 4 mm x 60 mm Lutonix drug-eluting balloon inflated to 10 atm for approximately 2 minutes.  Follow-up imaging demonstrated greater than 50% residual stenosis and I deployed a 6 mm x 60 mm life stent across this lesion and then postdilated the stent with a 4 mm x 80 mm Lutonix drug-eluting balloon inflated to 8 atm for approximately 1 minute.  Follow-up imaging demonstrated less than 10% residual stenosis at both the SFA location as well as the popliteal location and preservation of the two-vessel runoff with the dominant still being the anterior tibial.  At this point we concluded the interventional portion of the surgery.  The right common femoral artery was then forward flushed and checked for backbleeding which was excellent.  It was then irrigated copiously with heparinized saline.  The right femoral patch angioplasty was completed in the usual fashion.  Flow was then reestablished first to the profunda femoris and then the superficial femoral artery. Any gaps or bleeding sites in  the suture line were easily controlled with a 6-0 Prolene suture.   The right groin was then irrigated copiously with Vashe and subsequently Vistaseal and fibrillar were placed in the wound.  Additionally, antibiotic beads using vancomycin  and gentamicin were also placed in the bed of the wounds.  The incision was repaired with a double layer of 2-0 Vicryl, a double layer of 3-0 Vicryl, and staples were used to approximate the skin.  Honeycomb dressing was then applied to the groin.   COMPLICATIONS: None  CONDITION: Kaylyn Paschal, M.D. [facility] Office: [phone]  03/24/2024, 5:20 PM

## 2024-03-24 NOTE — Transfer of Care (Signed)
 Immediate Anesthesia Transfer of Care Note  Patient: [name]  Procedure(s) Performed: ENDARTERECTOMY, FEMORAL (Right) APPLICATION OF CELL SAVER (Bilateral) INSERTION, STENT, ARTERY, ILIAC (Bilateral) ENDOVASCULAR REPAIR/STENT GRAFT (Right)  Patient Location: PACU  Anesthesia Type:General  Level of Consciousness: awake and alert   Airway & Oxygen Therapy: Patient Spontanous Breathing  Post-op Assessment: Report given to RN and Post -op Vital signs reviewed and stable  Post vital signs: stable  Last Vitals:  Vitals Value Taken Time  BP    Temp    Pulse    Resp    SpO2      Last Pain:  Vitals:   03/24/24 1108  TempSrc: Temporal  PainSc: 9          Complications: No notable events documented.

## 2024-03-24 NOTE — Anesthesia Procedure Notes (Signed)
 Procedure Name: Intubation Date/Time: 03/24/2024 12:30 PM  Performed by: Aniceto Kern, RNPre-anesthesia Checklist: Patient identified, Patient being monitored, Timeout performed, Emergency Drugs available and Suction available Patient Re-evaluated:Patient Re-evaluated prior to induction Oxygen Delivery Method: Circle system utilized Preoxygenation: Pre-oxygenation with 100% oxygen Induction Type: IV induction Ventilation: Mask ventilation without difficulty Laryngoscope Size: McGrath and 4 Grade View: Grade I Tube type: Oral Tube size: 7.5 mm Number of attempts: 1 Airway Equipment and Method: Stylet Placement Confirmation: ETT inserted through vocal cords under direct vision, positive ETCO2 and breath sounds checked- equal and bilateral Secured at: 23 cm Tube secured with: Tape Dental Injury: Teeth and Oropharynx as per pre-operative assessment

## 2024-03-24 NOTE — Op Note (Signed)
 OPERATIVE NOTE   PROCEDURE: 1.   Right common femoral, profunda femoris, and superficial femoral artery endarterectomies and patch angioplasty 2.   Aortogram and right lower extremity angiogram 3.   Stent placement to the left external iliac artery with 12 mm diameter by 6 cm length life star stent 4.   Stent placement to the right common iliac artery with 14 mm diameter by 6 cm length life star stent 5.   Stent placement to the right external iliac artery with 9 mm diameter by 10 cm length Viabahn stent 6.   Stent placement to the right SFA with 6 mm diameter by 6 cm length life stent 7.  Angioplasty to the right popliteal artery with 4 mm diameter by 6 cm length Lutonix drug-coated angioplasty balloon 8.  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  significant plaque in right common femoral, profunda femoris, and superficial femoral arteries 2.  The right iliac system showed occlusion of the distal right common iliac artery and the entire right external iliac artery in addition to the severe common femoral artery disease.  The left external iliac artery had about a 70 to 80% stenosis in the proximal segment and the left hypogastric artery was occluded.   SPECIMEN(S):  Right common femoral, profunda femoris, and superficial femoral artery plaque.  INDICATIONS:    Patient presents with ulceration of the right foot which is nonhealing and severe bilateral lower extremity vascular disease.  Right femoral endarterectomy as well as bilateral iliac and right femoral-popliteal intervention is planned to try to  improve perfusion.  The risks and benefits as well as alternative therapies including intervention were reviewed in detail all questions were answered the patient agrees to proceed with surgery.  DESCRIPTION: After obtaining full informed written consent, the patient was brought back to the operating room and placed supine upon the operating table.  The patient received IV antibiotics prior to induction.  After obtaining adequate anesthesia, the patient was prepped and draped in the standard fashion appropriate time out is called.    Vertical incision was created overlying the right femoral arteries. The common femoral artery proximally, and superficial femoral artery, and primary profunda femoris artery branches were encircled with vessel loops and prepared for control. The right femoral arteries were found to have significant plaque from the common femoral artery into the profunda and superficial femoral arteries.   7000 units of heparin  was given and allowed circulate for 5 minutes.  Additional heparin  was given at 1 hour.  Attention is then turned to the right femoral artery.  An arteriotomy is made with 11 blade and extended with Potts scissors in the common femoral artery and carried down onto the first 2-3 cm of the superficial femoral artery. An endarterectomy was then performed. The [facility] was used to create a plane. The proximal endpoint was cut flush with tenotomy scissors. This was in the proximal common femoral artery. An extensive eversion endarterectomy was then performed for the first 2-3 cm of the profunda femoris artery.  The Therapist, nutritional was used with gentle traction to create a nice plane.  A feathered distal endpoint was  removed. Good backbleeding was then seen. The distal endpoint of the superficial femoral endarterectomy was created with gentle traction and the distal endpoint was tacked down with 7-0 Prolene sutures.  The bovine pericardial patcth is then selected and  prepared for a patch angioplasty.  It is cut and beveled and started at the proximal endpoint with a 6-0 Prolene suture.  Approximately one half of the suture line is run medially and laterally and the distal end point was cut and bevelled to match the arteriotomy.  A second 6-0 Prolene was started at the distal end point and run to the mid portion to complete the arteriotomy, leaving a gap to place the 8 French sheath from the right side for the endovascular portion of the procedure.  Interrupted sutures were placed both proximally and distally.   We then turned our attention to the endovascular portion of the procedure.  Dr. Prescilla Brod placed a sheath up the right side in a retrograde fashion and this was an 8 [place] sheath.  He was able to easily cross the occlusion in the right external and common iliac arteries without difficulty with a Kumpe catheter and a Glidewire.  Intraluminal flow was confirmed with the catheter in the aorta and aortogram was performed.  This showed the aorta to be patent.  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 The right iliac system showed occlusion of the distal right common iliac artery and the entire right external iliac artery in addition to the severe common femoral artery disease.  The left external iliac artery had about a 70 to 80% stenosis in the proximal segment and the left hypogastric artery was occluded.  I then accessed the left femoral artery without difficulty with a Seldinger needle under direct ultrasound guidance and a permanent image was recorded.  A 6 French sheath was placed up the left side.  On the left side, I placed a 12 mm diameter by 6 cm length life star stent across the lesion in the proximal left external iliac artery.  This was postdilated with an 8 mm diameter Lutonix drug-coated balloon with excellent angiographic completion result and less than 10% residual stenosis.  On the right side, Dr. Prescilla Brod placed a 14 mm diameter by 6 cm  length life star stent in the distal common iliac artery going across the origin of the right external iliac artery.  This was postdilated with an 8 mm diameter Lutonix drug-coated balloon.  He then placed a 9 mm diameter by 10 cm length Viabahn stent in the right external iliac artery down to the top of the femoral head.  This was postdilated with an 8 mm diameter high-pressure angioplasty balloon.  Completion imaging following the 2 stent placement in the right common and external iliac artery showed marked improvement with less than 10% residual stenosis.  There was now excellent pulsatile flow in the right common femoral artery.  The left femoral sheath was removed and StarClose closure device was deployed in the usual fashion with excellent hemostatic result.  Dr. Prescilla Brod then turn the 8 French sheath in the right femoral artery down into an antegrade fashion.  He then imaged the right lower extremity through the sheath.  In the mid right SFA there was a high-grade near occlusive stenosis of greater than 90% that was short segment.  In the mid popliteal artery behind the knee there was about a 70 to 80% stenosis.  The below-knee popliteal artery normalized and there appeared to be two-vessel runoff  distally with a large anterior tibial artery being the dominant runoff.  He then crossed these lesions without difficulty with an advantage wire and a Kumpe catheter.  The SFA was primarily stented with a 6 mm diameter by 6 cm length life stent that was then postdilated with a 5 mm diameter by 6 cm length Lutonix drug-coated angioplasty balloon with excellent angiographic completion result and less than 10% residual stenosis.  He then treated the popliteal artery initially with a 4 mm diameter by 6 cm length Lutonix drug-coated angioplasty balloon inflated to 10 atm for 1 minute.  Imaging following angioplasty showed a residual greater than 50% residual stenosis and a 6 mm diameter by 6 cm length life stent was then  deployed in the right popliteal artery and postdilated with a 4 mm diameter Lutonix drug-coated angioplasty balloon with excellent angiographic completion result and less than 10% residual stenosis.  The sheath was then removed and we prepared for closure of the arteriotomy.  The right femoral vessels were flushed prior to release of control and completion of the anastomosis.  At this point, flow was established first to the profunda femoris artery and then to the superficial femoral artery. Easily palpable pulses are noted well beyond the anastomosis and both arteries.  The wound was washed with Vashe irrigation.  Antibiotic impregnated beads were placed in the wound using 1 g of vancomycin  and 80 mg of gentamicin.  Fibrillar and Vistaseal topical hemostatic agents were placed in the femoral incision and hemostasis was complete. The femoral incision was then closed in a layered fashion with 2 layers of 2-0 Vicryl, 2 layers of 3-0 Vicryl, and staples for the skin closure. Sterile dressing were then placed over the incision.  The patient was then awakened from anesthesia and taken to the recovery room in stable condition having tolerated the procedure well.  COMPLICATIONS: None  CONDITION: Stable     Mikki Alexander 03/24/2024 4:13 PM   This note was created with Dragon Medical transcription system. Any errors in dictation are purely unintentional.
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
## 2024-03-24 NOTE — Anesthesia Procedure Notes (Signed)
 Arterial Line Insertion Start/End4/30/2025 12:27 PM, 03/24/2024 12:30 PM Performed by: Baltazar Bonier, MD, Aniceto Kern, RN  Patient location: Pre-op . Preanesthetic checklist: patient identified, IV checked, site marked, risks and benefits discussed, surgical consent, monitors and equipment checked, pre-op  evaluation, timeout performed and anesthesia consent Lidocaine  1% used for infiltration Right, radial was placed Catheter size: 20 G Hand hygiene performed , maximum sterile barriers used  and Seldinger technique used  Attempts: 1 Procedure performed using ultrasound guided technique. Following insertion, dressing applied and Biopatch. Post procedure assessment: normal and unchanged  Patient tolerated the procedure well with no immediate complications.

## 2024-03-24 NOTE — Progress Notes (Signed)
 Progress Note    03/24/2024 7:46 AM 1 Day Post-Op  Subjective:  [name] is a 63 yo male who presented to [facility] on 03/23/24 and is now POD #1 from abdominal aortogram with selective injection of the right lower extremity.  Patient is resting comfortably this morning in bed.  Denies any pain this morning.  No complaints overnight vitals all remained stable.  Review of Systems  Constitutional:  Constitutional negative. HENT: HENT negative.  Eyes: Positive for loss of vision.   Respiratory: Respiratory negative.  Cardiovascular: Cardiovascular negative.  GI: Gastrointestinal negative.  GU: Genitourinary negative. Musculoskeletal:       Patient has a history of paraplegia. Skin: Skin negative.  Neurological: Neurological negative. Psychiatric: Psychiatric negative.  All other systems reviewed and are negative   Vitals:   03/24/24 0000 03/24/24 0434  BP: 126/74 124/67  Pulse: 67 78  Resp: 18 16  Temp: 98.1 F (36.7 C) 98.6 F (37 C)  SpO2: 100% 100%   Physical Exam: Cardiac:  RRR, normal S1 and S2.  No rubs clicks gallops or murmurs noted. Lungs: Lungs clear on auscultation but diminished in the bases.  Nonlabored breathing.  No rales rhonchi or wheezing noted. Incisions: Left groin incision.  Dressing clean dry and intact.  No hematoma seroma to note. Extremities: Bilateral lower extremities warm to touch.  Unable to palpate DP/PT pulse on the right. Abdomen: Positive bowel sounds throughout, soft, nontender and nondistended. Neurologic: Alert and oriented x 1 to name only.  Patient is blind and paraplegic.  CBC    Component Value Date/Time   WBC 5.3 03/24/2024 0526   RBC 3.49 (L) 03/24/2024 0526   HGB 9.7 (L) 03/24/2024 0526   HCT 29.7 (L) 03/24/2024 0526   PLT 198 03/24/2024 0526   MCV 85.1 03/24/2024 0526   MCH 27.8 03/24/2024 0526   MCHC 32.7 03/24/2024 0526   RDW 14.2 03/24/2024 0526   LYMPHSABS 1.3 12/21/2023 1545   MONOABS 1.0  12/21/2023 1545   EOSABS 0.1 12/21/2023 1545   BASOSABS 0.0 12/21/2023 1545    BMET    Component Value Date/Time   NA 141 03/24/2024 0526   K 4.3 03/24/2024 0526   CL 109 03/24/2024 0526   CO2 21 (L) 03/24/2024 0526   GLUCOSE 76 03/24/2024 0526   BUN 60 (H) 03/24/2024 0526   CREATININE 4.29 (H) 03/24/2024 0526   CALCIUM  8.6 (L) 03/24/2024 0526   GFRNONAA 15 (L) 03/24/2024 0526   GFRAA >60 07/12/2011 0610    INR    Component Value Date/Time   INR 1.0 12/21/2023 1545     Intake/Output Summary (Last 24 hours) at 03/24/2024 0746 Last data filed at 03/24/2024 0006 Gross per 24 hour  Intake 410.57 ml  Output 2300 ml  Net -1889.43 ml     Assessment/Plan:  63 y.o. male is s/p right lower extremity angiogram.  1 Day Post-Op   PLAN Patient's right lower extremity angiogram was diagnostic only.  No intervention at this time.  Based on the findings from the procedure patient will need a right femoral endarterectomy with revascularization of the external iliac and if this is not feasible then a femoral to femoral bypass with stenting of the left external iliac will be performed.  During the same procedure the distal SFA lesion can also be treated.  Vascular surgery plans on taking the patient to the operating room today for 30 2025 for right femoral endarterectomy with revascularization of the external iliac  artery and if this is not feasible left femoral to femoral bypass with stenting of the left external iliac will be performed.  I discussed in detail with the patient this morning the procedure, benefits, risk, and complications.  Patient verbalizes understanding and verbalizes wish to proceed with surgery to save his leg.  I answered all the patient's questions this morning.  The patient patient was made n.p.o. last night at midnight for the procedure today.  I discussed the case in detail with Dr. [name] MD and he agrees with plan.   DVT prophylaxis: ASA 81 mg daily,  heparin  5000 units subcu every 8 hours   Annamaria [facility]rette [facility] 03/24/2024 7:46 AM

## 2024-03-25 ENCOUNTER — Encounter: Payer: Self-pay | Admitting: Vascular Surgery

## 2024-03-25 LAB — CBC
HCT: 26 % — ABNORMAL LOW (ref 39.0–52.0)
Hemoglobin: 8.3 g/dL — ABNORMAL LOW (ref 13.0–17.0)
MCH: 27.7 pg (ref 26.0–34.0)
MCHC: 31.9 g/dL (ref 30.0–36.0)
MCV: 86.7 fL (ref 80.0–100.0)
Platelets: 204 10*3/uL (ref 150–400)
RBC: 3 MIL/uL — ABNORMAL LOW (ref 4.22–5.81)
RDW: 14.5 % (ref 11.5–15.5)
WBC: 9 10*3/uL (ref 4.0–10.5)
nRBC: 0 % (ref 0.0–0.2)

## 2024-03-25 LAB — RENAL FUNCTION PANEL
Albumin: 2.6 g/dL — ABNORMAL LOW (ref 3.5–5.0)
Anion gap: 9 (ref 5–15)
BUN: 61 mg/dL — ABNORMAL HIGH (ref 8–23)
CO2: 19 mmol/L — ABNORMAL LOW (ref 22–32)
Calcium: 8 mg/dL — ABNORMAL LOW (ref 8.9–10.3)
Chloride: 106 mmol/L (ref 98–111)
Creatinine, Ser: 4.47 mg/dL — ABNORMAL HIGH (ref 0.61–1.24)
GFR, Estimated: 14 mL/min — ABNORMAL LOW (ref >60)
Glucose, Bld: 111 mg/dL — ABNORMAL HIGH (ref 70–99)
Phosphorus: 3.4 mg/dL (ref 2.5–4.6)
Potassium: 3.9 mmol/L (ref 3.5–5.1)
Sodium: 134 mmol/L — ABNORMAL LOW (ref 135–145)

## 2024-03-25 LAB — GLUCOSE, CAPILLARY
Glucose-Capillary: 182 mg/dL — ABNORMAL HIGH (ref 70–99)
Glucose-Capillary: 174 mg/dL — ABNORMAL HIGH (ref 70–99)

## 2024-03-25 LAB — HEPATITIS B CORE ANTIBODY, TOTAL: HEP B CORE AB: POSITIVE — AB

## 2024-03-25 LAB — HEPATITIS B SURFACE ANTIBODY, QUANTITATIVE: Hep B S AB Quant (Post): 343 m[IU]/mL

## 2024-03-25 MED ORDER — FAMOTIDINE 20 MG PO TABS
20.0000 mg | ORAL_TABLET | Freq: Every day | ORAL | Status: DC
Start: 2024-03-25 — End: 2024-03-29
  Administered 2024-03-25 – 2024-03-29 (×5): 20 mg via ORAL
  Filled 2024-03-25 (×5): qty 1

## 2024-03-25 MED ORDER — LIDOCAINE-EPINEPHRINE (PF) 1.5 %-1:200000 IJ SOLN
30.0000 mL | Freq: Once | INTRAMUSCULAR | Status: AC
  Administered 2024-03-25: 30 mL
  Filled 2024-03-25: qty 30

## 2024-03-25 NOTE — Anesthesia Postprocedure Evaluation (Signed)
 Anesthesia Post Note  Patient: [name]  Procedure(s) Performed: ENDARTERECTOMY, FEMORAL (Right) APPLICATION OF CELL SAVER (Bilateral) INSERTION, STENT, ARTERY, ILIAC (Bilateral) ENDOVASCULAR REPAIR/STENT GRAFT (Right)  Patient location during evaluation: ICU Anesthesia Type: General Level of consciousness: awake and alert Pain management: pain level controlled Vital Signs Assessment: post-procedure vital signs reviewed and stable Respiratory status: spontaneous breathing Cardiovascular status: stable Postop Assessment: no apparent nausea or vomiting Anesthetic complications: no   There were no known notable events for this encounter.   Last Vitals:  Vitals:   03/25/24 1100 03/25/24 1102  BP: (!) 113/90 (!) 113/90  Pulse: 71   Resp: 14   Temp:    SpO2: 98%     Last Pain:  Vitals:   03/25/24 0756  TempSrc: Oral  PainSc: 0-No pain                 Frenchie Pribyl Darlen Eglin

## 2024-03-25 NOTE — Progress Notes (Signed)
 Procedural Note:  I was called to the bedside of [name]. Hosek this afternoon for continued oozing/bleeding from left groin puncture site from procedure yesterday.  Patient was noted to have some skin bleeding from this area.  Patient's left groin was prepped in a sterile manner.  I then injected 3 cc of 1% lidocaine  with epinephrine 1-100,000 to the puncture site.  I then used 2 Monocryl 2-0 sutures and closed the puncture site.  Area was then covered with sterile gauze and Tegaderm.  No bleeding noted no hematoma seroma at the puncture site.  No complications to note.
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
## 2024-03-25 NOTE — Progress Notes (Signed)
 Pt was minimally bleeding from both fem sites, more on the right vascular site, with complete saturation of honeycomb dressing. Pt was alert and calm, HR in the 70s NSR, BP was 166/62 (92), and O2 stats were 99, spoke with vascular MD and was recommended to manually hold pressure for 15 mins and maintain BP in the 130-140s sys. After manual hold, honeycomb dressing was changed and left groin site dressing was changed after manual pressure. Labetalol  and hydralazine  prn were given. BP is now 140/51
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
## 2024-03-25 NOTE — Plan of Care (Signed)
  Problem: Education: Goal: Knowledge of General Education information will improve Description: Including pain rating scale, medication(s)/side effects and non-pharmacologic comfort measures Outcome: Not Progressing   Problem: Activity: Goal: Risk for activity intolerance will decrease Outcome: Not Progressing   Problem: Coping: Goal: Level of anxiety will decrease Outcome: Not Progressing   Problem: Pain Managment: Goal: General experience of comfort will improve and/or be controlled Outcome: Not Progressing   Problem: Safety: Goal: Ability to remain free from injury will improve Outcome: Not Progressing   Problem: Skin Integrity: Goal: Risk for impaired skin integrity will decrease Outcome: Not Progressing

## 2024-03-25 NOTE — Progress Notes (Signed)
  Progress Note    03/25/2024 8:56 AM 1 Day Post-Op  Subjective:  [name] is a 63 yo male who is now POD #1 from right common femoral, profunda femoris, and superficial femoral endarterectomies with patch angioplasty. Stents were placed to the left external iliac artery, right common iliac artery, right external iliac artery, right SFA, and right popliteal artery.   On exam this morning the patient was wide awake this morning in bed in ICU. He complains of bleeding from his left groin with some pain to his right groin. No other complaints overnight. Foley catheter in place and arterial blood pressure line in place. Vitals all remain stable.     Vitals:   03/25/24 0700 03/25/24 0756  BP:  119/69  Pulse: 75 71  Resp: 16 16  Temp:  98.8 F (37.1 C)  SpO2: 98% 97%   Physical Exam: Cardiac:  RRR, Normal S1 &S2. No rubs clicks or gallup's or murmurs.  Lungs:  Clear on auscultation throughout. Normal labored breathing. No rales rhonchi or wheezing noted. Incisions:  Bilateral groin incisions. Dressings clean dry and intact. No hematoma or seroma to note.  Extremities:  Bilateral lower extremities are warm to touch with palpable pulses.  Abdomen:  Positive bowel sounds throughout, soft, non tender and non distended. Neurologic: AAOX3, Answers all questions and follows commands appropriately.   CBC    Component Value Date/Time   WBC 10.5 03/24/2024 2333   RBC 3.45 (L) 03/24/2024 2333   HGB 9.7 (L) 03/24/2024 2333   HCT 29.5 (L) 03/24/2024 2333   PLT 191 03/24/2024 2333   MCV 85.5 03/24/2024 2333   MCH 28.1 03/24/2024 2333   MCHC 32.9 03/24/2024 2333   RDW 14.2 03/24/2024 2333   LYMPHSABS 1.3 12/21/2023 1545   MONOABS 1.0 12/21/2023 1545   EOSABS 0.1 12/21/2023 1545   BASOSABS 0.0 12/21/2023 1545    BMET    Component Value Date/Time   NA 137 03/24/2024 2333   K 4.4 03/24/2024 2333   CL 107 03/24/2024 2333   CO2 18 (L) 03/24/2024 2333   GLUCOSE 265 (H) 03/24/2024 2333    BUN 63 (H) 03/24/2024 2333   CREATININE 4.28 (H) 03/24/2024 2333   CALCIUM  8.1 (L) 03/24/2024 2333   GFRNONAA 15 (L) 03/24/2024 2333   GFRAA >60 07/12/2011 0610    INR    Component Value Date/Time   INR 1.0 12/21/2023 1545     Intake/Output Summary (Last 24 hours) at 03/25/2024 0856 Last data filed at 03/25/2024 0524 Gross per 24 hour  Intake 2717.77 ml  Output 2375 ml  Net 342.77 ml     Assessment/Plan:  63 y.o. male is s/p from right common femoral, profunda femoris, and superficial femoral endarterectomies with patch angioplasty. Stents were placed to the left external iliac artery, right common iliac artery, right external iliac artery, right SFA, and right popliteal artery.  1 Day Post-Op   PLAN Discontinue foley catheter Discontinue arterial BP Line Pain medication PRN Advance diet as tolerated PT/OT Consults.  OOB to bedside chair with 2 person assist.  Transfer to the floor  DVT prophylaxis:  ASA 81 mg and Plavix  75 mg daily.    Annamaria [facility] 03/25/2024 8:56 AM

## 2024-03-25 NOTE — Progress Notes (Signed)
 Hemodialysis Note:  Received patient in bed to unit. Alert and oriented. Informed consent singed and in chart.  Treatment initiated: 1604 Treatment completed: 1806  Access used: Left AVF Access issues: None  Patient tolerated well. Transported back to room, alert without acute distress. Report given to patient's RN.  Total UF removed: 0 Medications given: None  Post HD weight: 81.4 Kg  [facility]

## 2024-03-25 NOTE — Progress Notes (Signed)
 0800 Patient's left groin  noted to have saturated dressing.. No active bleeding noted. Manual pressure held at site x 15 minutes. Left groin site redressed. 1000 Left groin site saturated again. Bloody drainage noted

## 2024-03-25 NOTE — Evaluation (Signed)
 Physical Therapy Evaluation Patient Details Name: [name] MRN: [id number] DOB: 05-19-1961 Today's Date: 03/25/2024  History of Present Illness  [name] is a 63 yo male who is now POD #1 from right common femoral, profunda femoris, and superficial femoral endarterectomies with patch angioplasty. Stents were placed to the left external iliac artery, right common iliac artery, right external iliac artery, right SFA, and right popliteal artery.   Clinical Impression  Pt admitted with above diagnosis. Pt currently with functional limitations due to the deficits listed below (see PT Problem List). Pt received upright in bed agreeable to PT/OT co-eval. Pt mildly irritable but reports PTA being in [facility] and SPT transfers at baseline <> his manual w/c.   To date pt reliant on mod multimodal cuing for hand placement due to visual impairment. ModA+2 to sit EOB. Dependent for donning shoes which is baseline. VC's to limit RUE use due to art line reliant on L knee blocking and HHA+2 to stand and modA+2 for SPT to recliner. Pt placed with all needs in reach in care of NT for eating breakfast. Pt below baseline level of activity needing 2 person assist so pt would benefit from skilled PT services to address deficits in mobility and strength to maximize return to PLOF.     If plan is discharge home, recommend the following: Two people to help with walking and/or transfers;Two people to help with bathing/dressing/bathroom;Help with stairs or ramp for entrance;Assist for transportation   Can travel by private vehicle   No    Equipment Recommendations None recommended by PT  Recommendations for Other Services       Functional Status Assessment       Precautions / Restrictions Precautions Precautions: Fall Restrictions Weight Bearing Restrictions Per Provider Order: No      Mobility  Bed Mobility Overal bed mobility: Needs Assistance Bed Mobility: Supine to Sit     Supine to sit:  Mod assist, HOB elevated, Used rails     General bed mobility comments: hand over 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 Patient Response: Cooperative (irritable)  Transfers Overall transfer level: Needs assistance   Transfers: Bed to chair/wheelchair/BSC     Step pivot transfers: Mod assist, +2 physical assistance       General transfer comment: L knee blocking    Ambulation/Gait               General Gait Details: Does not ambulate at baseline  Stairs            Wheelchair Mobility     Tilt Bed Tilt Bed Patient Response: Cooperative (irritable)  Modified Rankin (Stroke Patients Only)       Balance Overall balance assessment: Needs assistance Sitting-balance support: Feet supported Sitting balance-Leahy Scale: Fair       Standing balance-Leahy Scale: Poor Standing balance comment: 2 person support                             Pertinent Vitals/Pain Pain Assessment Pain Assessment: Faces Faces Pain Scale: Hurts little more Pain Location: R/L groin    Home Living Family/patient expects to be discharged to:: Skilled nursing facility                   Additional Comments: LTC at [facility]    Prior Function Prior Level of Function : Needs assist             Mobility Comments: SPT to  from w/c at baseline ADLs Comments: Assist for ADL     Extremity/Trunk Assessment   Upper Extremity Assessment Upper Extremity Assessment: Defer to OT evaluation (seems to have R/L hand spasticity)    Lower Extremity Assessment Lower Extremity Assessment: Generalized weakness       Communication   Communication Communication: No apparent difficulties    Cognition Arousal: Alert Behavior During Therapy: WFL for tasks assessed/performed   PT - Cognitive impairments: No apparent impairments                         Following commands: Intact       Cueing Cueing Techniques: Verbal cues, Gestural cues, Tactile cues     General  Comments      Exercises Other Exercises Other Exercises: Limiting RUE use due to art line   Assessment/Plan    PT Assessment    PT Problem List         PT Treatment Interventions      PT Goals (Current goals can be found in the Care Plan section)  Acute Rehab PT Goals Patient Stated Goal: to improve R/L LE pain PT Goal Formulation: With patient Time For Goal Achievement: 04/08/24 Potential to Achieve Goals: Good    Frequency Min 2X/week     Co-evaluation               AM-PAC PT "6 Clicks" Mobility  Outcome Measure Help needed turning from your back to your side while in a flat bed without using bedrails?: Total Help needed moving from lying on your back to sitting on the side of a flat bed without using bedrails?: Total Help needed moving to and from a bed to a chair (including a wheelchair)?: A Lot Help needed standing up from a chair using your arms (e.g., wheelchair or bedside chair)?: A Lot Help needed to walk in hospital room?: Total Help needed climbing 3-5 steps with a railing? : Total 6 Click Score: 8    End of Session Equipment Utilized During Treatment: Gait belt Activity Tolerance: Patient tolerated treatment well Patient left: in chair;with call bell/phone within reach;with nursing/sitter in room Nurse Communication: Mobility status PT Visit Diagnosis: Other abnormalities of gait and mobility (R26.89);Muscle weakness (generalized) (M62.81)    Time: 9811-9147 PT Time Calculation (min) (ACUTE ONLY): 11 min   Charges:   PT Evaluation $PT Eval Moderate Complexity: 1 Mod   PT General Charges $$ ACUTE PT VISIT: 1 Visit         [name] M. Fairly IV, PT, DPT Physical Therapist- [place]  [facility] 03/25/2024, 10:59 AM

## 2024-03-26 LAB — GLUCOSE, CAPILLARY
Glucose-Capillary: 161 mg/dL — ABNORMAL HIGH (ref 70–99)
Glucose-Capillary: 176 mg/dL — ABNORMAL HIGH (ref 70–99)
Glucose-Capillary: 213 mg/dL — ABNORMAL HIGH (ref 70–99)
Glucose-Capillary: 164 mg/dL — ABNORMAL HIGH (ref 70–99)

## 2024-03-26 LAB — SURGICAL PATHOLOGY

## 2024-03-26 NOTE — [place] FL2 (Signed)
 [place]  MEDICAID FL2 LEVEL OF CARE FORM     IDENTIFICATION  Patient Name: [name] Birthdate: 16-Dec-1960 Sex: male Admission Date (Current Location): 03/23/2024  [place] and [place] Number:  Chiropodist and Address:  [facility], [address], [place], [place] [address]      Provider Number: [id number]  Attending Physician Name and Address:  Jackquelyn Mass, MD  Relative Name and Phone Number:       Current Level of Care: Hospital Recommended Level of Care: Skilled Nursing Facility Prior Approval Number:    Date Approved/Denied:   PASRR Number: [id number]  Discharge Plan: SNF    Current Diagnoses: Patient Active Problem List   Diagnosis Date Noted   Atherosclerosis of artery of extremity with ulceration (HCC) 03/23/2024   Acute CVA (cerebrovascular accident) (HCC) 12/22/2023   Hypertensive urgency 12/22/2023   Dyslipidemia 12/22/2023   Peripheral neuropathy 12/22/2023   CAD (coronary artery disease) 01/30/2022   Acute urinary retention 11/23/2021   History of hepatitis C 11/22/2021   Bacteremia due to Klebsiella pneumoniae 11/22/2021   UTI (urinary tract infection) 11/21/2021   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) 11/21/2021   Severe sepsis (HCC) 11/21/2021   Diabetes (HCC) 10/22/2021   Allergic rhinitis 10/22/2021   Generalized anxiety disorder 10/22/2021   GERD (gastroesophageal reflux disease) 10/22/2021   Chronic hepatitis (HCC) 10/22/2021   HL (hearing loss) 10/22/2021   Primary hypertension 10/22/2021   Iron deficiency anemia 10/22/2021   Hyperlipidemia 10/22/2021   Ischemic heart disease 10/22/2021   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HCC) 10/22/2021   Chronic renal disease, stage V (HCC) 10/22/2021   Difficulty walking 12/10/2020   Tremor 11/04/2018   Left leg pain 08/29/2015   Cerumen impaction 05/31/2013   Hearing loss of both ears 05/31/2013   Chronic osteomyelitis of lower leg (HCC) 07/23/2012     Orientation RESPIRATION BLADDER Height & Weight     Self, Time, Situation, Place  Normal Incontinent Weight: 179 lb 7.3 oz (81.4 kg) Height:  6\' 2"  (188 cm)  BEHAVIORAL SYMPTOMS/MOOD NEUROLOGICAL BOWEL NUTRITION STATUS   (None)  (None) Continent Diet (Renal/carb modified with fluid restriction: 1800 mL.)  AMBULATORY STATUS COMMUNICATION OF NEEDS Skin   Total Care Verbally Skin abrasions, Other (Comment), PU Stage and Appropriate Care (Erythema/redness. Incision on left groin: Gauze, thin film prn. Non-pressure wound on right buttocks: Foam every 3 days.)   PU Stage 2 Dressing:  (Right buttocks: Foam every 3 days.)                   Personal Care Assistance Level of Assistance  Bathing, Feeding, Dressing Bathing Assistance: Limited assistance Feeding assistance: Limited assistance Dressing Assistance: Limited assistance     Functional Limitations Info  Sight, Hearing, Speech Sight Info: Impaired Hearing Info: Adequate Speech Info: Adequate    SPECIAL CARE FACTORS FREQUENCY  PT (By licensed PT), OT (By licensed OT)     PT Frequency: 5 x week OT Frequency: 5 x week            Contractures Contractures Info: Present    Additional Factors Info  Code Status, Allergies Code Status Info: Full code Allergies Info: NKDA           Current Medications (03/26/2024):  This is the current hospital active medication list Current Facility-Administered Medications  Medication Dose Route Frequency Provider Last Rate Last Admin   0.9 %  sodium chloride  infusion  500 mL Intravenous Once PRN Schnier, Gregory G,  MD       alum & mag hydroxide-simeth (MAALOX/MYLANTA) 200-200-20 MG/5ML suspension 15-30 mL  15-30 mL Oral Q2H PRN Schnier, Gregory G, MD       amLODipine  (NORVASC ) tablet 10 mg  10 mg Oral Daily Schnier, Gregory G, MD   10 mg at 03/26/24 6045   aspirin  EC tablet 81 mg  81 mg Oral Daily Schnier, Gregory G, MD   81 mg at 03/26/24 4098   atorvastatin  (LIPITOR) tablet 40  mg  40 mg Oral QHS Schnier, Gregory G, MD   40 mg at 03/25/24 2158   Chlorhexidine  Gluconate Cloth 2 % PADS 6 each  6 each Topical Q0600 Jackquelyn Mass, MD   6 each at 03/26/24 0529   clopidogrel  (PLAVIX ) tablet 75 mg  75 mg Oral Q0600 Schnier, Gregory G, MD   75 mg at 03/26/24 1191   docusate sodium  (COLACE) capsule 100 mg  100 mg Oral Daily Schnier, Gregory G, MD   100 mg at 03/26/24 4782   DOPamine  (INTROPIN ) 800 mg in dextrose  5 % 250 mL (3.2 mg/mL) infusion  3-5 mcg/kg/min Intravenous Continuous Jackquelyn Mass, MD   Held at 03/24/24 2021   famotidine  (PEPCID ) tablet 20 mg  20 mg Oral Daily Schnier, Gregory G, MD   20 mg at 03/26/24 9562   feeding supplement (ENSURE ENLIVE / ENSURE PLUS) liquid 237 mL  237 mL Oral BID BM Schnier, Gregory G, MD   237 mL at 03/26/24 1308   gabapentin  (NEURONTIN ) capsule 200 mg  200 mg Oral BID Schnier, Gregory G, MD   200 mg at 03/26/24 6578   guaiFENesin -dextromethorphan (ROBITUSSIN DM) 100-10 MG/5ML syrup 15 mL  15 mL Oral Q4H PRN Schnier, Gregory G, MD       heparin  injection 1,000 Units  1,000 Units Intracatheter PRN Schnier, Ninette Basque, MD       hydrALAZINE  (APRESOLINE ) injection 5 mg  5 mg Intravenous Q20 Min PRN Schnier, Gregory G, MD   5 mg at 03/25/24 4696   insulin  aspart (novoLOG ) injection 0-15 Units  0-15 Units Subcutaneous TID WC Schnier, Gregory G, MD   3 Units at 03/25/24 1200   insulin  aspart (novoLOG ) injection 0-5 Units  0-5 Units Subcutaneous QHS Schnier, Ninette Basque, MD       labetalol  (NORMODYNE ) injection 10 mg  10 mg Intravenous Q10 min PRN Schnier, Gregory G, MD   10 mg at 03/25/24 2952   lidocaine -prilocaine  (EMLA ) cream 1 Application  1 Application Topical PRN Schnier, Gregory G, MD       magnesium  sulfate IVPB 2 g 50 mL  2 g Intravenous Daily PRN Schnier, Gregory G, MD       metoprolol  succinate (TOPROL -XL) 24 hr tablet 12.5 mg  12.5 mg Oral Daily Schnier, Gregory G, MD   12.5 mg at 03/26/24 8413   metoprolol  tartrate (LOPRESSOR )  injection 2-5 mg  2-5 mg Intravenous Q2H PRN Schnier, Gregory G, MD       oxyCODONE  (Oxy IR/ROXICODONE ) immediate release tablet 5-10 mg  5-10 mg Oral Q4H PRN Schnier, Gregory G, MD   5 mg at 03/26/24 2440   oxyCODONE  (OXYCONTIN ) 12 hr tablet 10 mg  10 mg Oral QHS Schnier, Gregory G, MD   10 mg at 03/25/24 2158   pentafluoroprop-tetrafluoroeth (GEBAUERS) aerosol 1 Application  1 Application Topical PRN Schnier, Gregory G, MD       phenol (CHLORASEPTIC) mouth spray 1 spray  1 spray Mouth/Throat PRN Schnier, Ninette Basque, MD  potassium chloride  SA (KLOR-CON  M) CR tablet 20-40 mEq  20-40 mEq Oral Daily PRN Schnier, Gregory G, MD       primidone  (MYSOLINE ) tablet 12.5 mg  12.5 mg Oral BID Prescilla Brod Gregory G, MD   12.5 mg at 03/26/24 0865   rOPINIRole  (REQUIP ) tablet 0.25 mg  0.25 mg Oral QHS Schnier, Gregory G, MD   0.25 mg at 03/25/24 2158   senna-docusate (Senokot-S) tablet 1 tablet  1 tablet Oral BID Schnier, Gregory G, MD   1 tablet at 03/26/24 7846   senna-docusate (Senokot-S) tablet 1 tablet  1 tablet Oral QHS PRN Schnier, Gregory G, MD       sertraline  (ZOLOFT ) tablet 50 mg  50 mg Oral Daily Schnier, Gregory G, MD   50 mg at 03/26/24 9629   sorbitol  70 % solution 30 mL  30 mL Oral Daily PRN Schnier, Gregory G, MD         Discharge Medications: Please see discharge summary for a list of discharge medications.  Relevant Imaging Results:  Relevant Lab Results:   Additional Information SS#: [id number]  Odilia Bennett, LCSW

## 2024-03-26 NOTE — TOC Initial Note (Signed)
 Transition of Care [facility]) - Initial/Assessment Note    Patient Details  Name: [name] MRN: [id number] Date of Birth: May 19, 1961  Transition of Care [facility]) CM/SW Contact:    Odilia Bennett, LCSW Phone Number: 03/26/2024, 11:41 AM  Clinical Narrative:    CSW met with patient. No family at bedside. Patient kept blanket over his head during conversation. CSW introduced role and explained that discharge planning would be discussed. Patient confirmed he is a long-term resident at [facility]. CSW asked if he would be willing to get rehab when he returns. Patient stated he was already receiving rehab prior to admission. Admissions coordinator confirmed he is a long-term resident as well. No further concerns. CSW will continue to follow patient for support and facilitate return to SNF once medically stable.              Expected Discharge Plan: Skilled Nursing Facility Barriers to Discharge: Continued Medical Work up   Patient Goals and CMS Choice     Choice offered to / list presented to : Patient      Expected Discharge Plan and Services     Post Acute Care Choice: Resumption of Svcs/PTA Provider Living arrangements for the past 2 months: Skilled Nursing Facility                                      Prior Living Arrangements/Services Living arrangements for the past 2 months: Skilled Nursing Facility Lives with:: Facility Resident Patient language and need for interpreter reviewed:: Yes Do you feel safe going back to the place where you live?: Yes      Need for Family Participation in Patient Care: Yes (Comment) Care giver support system in place?: Yes (comment)   Criminal Activity/Legal Involvement Pertinent to Current Situation/Hospitalization: No - Comment as needed  Activities of Daily Living   ADL Screening (condition at time of admission) Independently performs ADLs?: No Does the patient have a NEW difficulty with  bathing/dressing/toileting/self-feeding that is expected to last >3 days?: No Does the patient have a NEW difficulty with getting in/out of bed, walking, or climbing stairs that is expected to last >3 days?: No Does the patient have a NEW difficulty with communication that is expected to last >3 days?: No Does the patient have difficulty seeing, even when wearing glasses/contacts?: Yes (BLIND BILAT.) Does the patient have difficulty concentrating, remembering, or making decisions?: No  Permission Sought/Granted Permission sought to share information with : Facility Industrial/product designer granted to share information with : Yes, Verbal Permission Granted     Permission granted to share info w AGENCY: [facility]        Emotional Assessment   Attitude/Demeanor/Rapport: Engaged Affect (typically observed): Accepting Orientation: : Oriented to Self, Oriented to Place, Oriented to  Time, Oriented to Situation Alcohol / Substance Use: Not Applicable Psych Involvement: No (comment)  Admission diagnosis:  Atherosclerosis of artery of extremity with ulceration (HCC) [W09.811, [name] Patient Active Problem List   Diagnosis Date Noted   Atherosclerosis of artery of extremity with ulceration (HCC) 03/23/2024   Acute CVA (cerebrovascular accident) (HCC) 12/22/2023   Hypertensive urgency 12/22/2023   Dyslipidemia 12/22/2023   Peripheral neuropathy 12/22/2023   CAD (coronary artery disease) 01/30/2022   Acute urinary retention 11/23/2021   History of hepatitis C 11/22/2021   Bacteremia due to Klebsiella pneumoniae 11/22/2021   UTI (urinary tract infection) 11/21/2021  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) 11/21/2021   Severe sepsis (HCC) 11/21/2021   Diabetes (HCC) 10/22/2021   Allergic rhinitis 10/22/2021   Generalized anxiety disorder 10/22/2021   GERD (gastroesophageal reflux disease) 10/22/2021   Chronic hepatitis (HCC) 10/22/2021   HL (hearing loss)  10/22/2021   Primary hypertension 10/22/2021   Iron deficiency anemia 10/22/2021   Hyperlipidemia 10/22/2021   Ischemic heart disease 10/22/2021   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HCC) 10/22/2021   Chronic renal disease, stage V (HCC) 10/22/2021   Difficulty walking 12/10/2020   Tremor 11/04/2018   Left leg pain 08/29/2015   Cerumen impaction 05/31/2013   Hearing loss of both ears 05/31/2013   Chronic osteomyelitis of lower leg (HCC) 07/23/2012   PCP:  Wilkie Harding, MD Pharmacy:   [facility]. [place], [facility] - [address] [place] [address] Phone: [phone] Fax: [phone]     Social Drivers of Health (SDOH) Social History: SDOH Screenings   Food Insecurity: Patient Declined (03/24/2024)  Housing: Patient Declined (03/24/2024)  Transportation Needs: Patient Declined (03/24/2024)  Utilities: Patient Declined (03/24/2024)  Financial Resource Strain: Low Risk  (12/18/2023)   Received from [facility]  Social Connections: Unknown (12/22/2023)  Tobacco Use: High Risk (03/03/2024)   SDOH Interventions:     Readmission Risk Interventions     No data to display

## 2024-03-26 NOTE — Progress Notes (Signed)
 PT Cancellation Note  Patient Details Name: [name] MRN: [id number] DOB: 09-24-1961   Cancelled Treatment:    Reason Eval/Treat Not Completed: Patient declined, no reason specified. Upon entry pt getting assist for feeding from RN. Pt irritable and firmly declining PT at this time. PT to re-attempt at a later time/date.    Marc Senior. Fairly IV, PT, DPT Physical Therapist- [place]  [facility]  03/26/2024, 2:35 PM

## 2024-03-26 NOTE — Progress Notes (Signed)
 [facility]  ROUNDING NOTE   Subjective:   Patient seen laying in bed Family at bedside Patient states he is cold and requesting further blankets.   Objective:  Vital signs in last 24 hours:  Temp:  [98.7 F (37.1 C)-99.8 F (37.7 C)] 99.4 F (37.4 C) (05/02 0841) Pulse Rate:  [73-95] 91 (05/02 0841) Resp:  [13-20] 17 (05/02 0841) BP: (106-127)/(52-71) 126/69 (05/02 0841) SpO2:  [95 %-98 %] 96 % (05/02 0841) Weight:  [81.4 kg] 81.4 kg (05/01 1806)  Weight change: 1.113 kg Filed Weights   03/24/24 1800 03/25/24 1546 03/25/24 1806  Weight: 80.3 kg 81.4 kg 81.4 kg    Intake/Output: I/O last 3 completed shifts: In: 1494.3 [P.O.:620; I.V.:715; IV Piggyback:159.3] Out: 2050 [Urine:2050]   Intake/Output this shift:  No intake/output data recorded.  Physical Exam: General: NAD  Head: Normocephalic, atraumatic. Moist oral mucosal membranes  Eyes: Blindness  Lungs:  Clear to auscultation, normal effort  Heart: Regular rate and rhythm  Abdomen:  Soft, nontender  Extremities:  1+ peripheral edema.  Neurologic: Alert and oriented, moving all four extremities  Skin: No lesions  Access: Lt upper AVF    Basic Metabolic Panel: Recent Labs  Lab 03/23/24 0932 03/23/24 1538 03/24/24 0526 03/24/24 2333 03/25/24 1606  NA  --   --  141 137 134*  K  --   --  4.3 4.4 3.9  CL  --   --  109 107 106  CO2  --   --  21* 18* 19*  GLUCOSE  --   --  76 265* 111*  BUN 68*  --  60* 63* 61*  CREATININE 4.85* 4.69* 4.29* 4.28* 4.47*  CALCIUM   --   --  8.6* 8.1* 8.0*  PHOS  --   --   --   --  3.4    Liver Function Tests: Recent Labs  Lab 03/24/24 0526 03/25/24 1606  AST 16  --   ALT 13  --   ALKPHOS 43  --   BILITOT 0.5  --   PROT 6.1*  --   ALBUMIN 2.9* 2.6*   No results for input(s): "LIPASE", "AMYLASE" in the last 168 hours. No results for input(s): "AMMONIA" in the last 168 hours.  CBC: Recent Labs  Lab 03/23/24 1538 03/24/24 0526 03/24/24 2333  03/25/24 1606  WBC 5.4 5.3 10.5 9.0  HGB 11.3* 9.7* 9.7* 8.3*  HCT 35.4* 29.7* 29.5* 26.0*  MCV 85.5 85.1 85.5 86.7  PLT 208 198 191 204    Cardiac Enzymes: No results for input(s): "CKTOTAL", "CKMB", "CKMBINDEX", "TROPONINI" in the last 168 hours.  BNP: Invalid input(s): "POCBNP"  CBG: Recent Labs  Lab 03/24/24 2045 03/25/24 0729 03/25/24 2121 03/26/24 0930 03/26/24 1204  GLUCAP 140* 182* 174* 161* 176*    Microbiology: Results for orders placed Meyer performed during the hospital encounter of 03/23/24  MRSA Next Gen by PCR, Nasal     Status: None   Collection Time: 03/24/24  6:05 PM   Specimen: Nasal Mucosa; Nasal Swab  Result Value Ref Range Status   MRSA by PCR Next Gen NOT DETECTED NOT DETECTED Final    Comment: (NOTE) The GeneXpert MRSA Assay (FDA approved for NASAL specimens only), is one component of a comprehensive MRSA colonization surveillance program. It is not intended to diagnose MRSA infection nor to guide Meyer monitor treatment for MRSA infections. Test performance is not FDA approved in patients less than 23 years old. Performed at [facility],  [address], [place], [place] [address]     Coagulation Studies: No results for input(s): "LABPROT", "INR" in the last 72 hours.  Urinalysis: No results for input(s): "COLORURINE", "LABSPEC", "PHURINE", "GLUCOSEU", "HGBUR", "BILIRUBINUR", "KETONESUR", "PROTEINUR", "UROBILINOGEN", "NITRITE", "LEUKOCYTESUR" in the last 72 hours.  Invalid input(s): "APPERANCEUR"    Imaging: DG C-Arm 1-60 Min-No Report Result Date: 03/24/2024 Fluoroscopy was utilized by the requesting physician.  No radiographic interpretation.   DG C-Arm 1-60 Min-No Report Result Date: 03/24/2024 Fluoroscopy was utilized by the requesting physician.  No radiographic interpretation.     Medications:    sodium chloride      DOPamine  Stopped (03/24/24 2021)   magnesium  sulfate bolus IVPB      amLODipine   10 mg Oral Daily    aspirin  EC  81 mg Oral Daily   atorvastatin   40 mg Oral QHS   Chlorhexidine  Gluconate Cloth  6 each Topical Q0600   clopidogrel   75 mg Oral Q0600   docusate sodium   100 mg Oral Daily   famotidine   20 mg Oral Daily   feeding supplement  237 mL Oral BID BM   gabapentin   200 mg Oral BID   insulin  aspart  0-15 Units Subcutaneous TID WC   insulin  aspart  0-5 Units Subcutaneous QHS   metoprolol  succinate  12.5 mg Oral Daily   oxyCODONE   10 mg Oral QHS   primidone   12.5 mg Oral BID   rOPINIRole   0.25 mg Oral QHS   senna-docusate  1 tablet Oral BID   sertraline   50 mg Oral Daily   sodium chloride , alum & mag hydroxide-simeth, guaiFENesin -dextromethorphan, heparin , hydrALAZINE , labetalol , lidocaine -prilocaine , magnesium  sulfate bolus IVPB, metoprolol  tartrate, oxyCODONE , pentafluoroprop-tetrafluoroeth, phenol, potassium chloride , senna-docusate, sorbitol   Assessment/ Plan:  [name] is a 63 y.o.  male with a PMHx of peripheral vascular disease, history of CVA, hypertension, diabetes mellitus type 2, BPH,, history of C7 cervical fracture, GERD, glaucoma, gout, hyperlipidemia, anemia chronic kidney disease, vitamin D  deficiency who was admitted to [facility] on 03/23/2024 for Atherosclerosis of artery of extremity with ulceration (HCC) [I70.299, [name]    Acute kidney injury/chronic kidney disease stage IV baseline creatinine 3.7 with EGFR 17 1/87/25/history of requiring hemodialysis.  Patient was previously requiring hemodialysis.  His baseline renal function showed a creatinine of 3.7 and EGFR 17 on 12/22/2023.  IV contrast exposure on 4/29 and 4/30.     Due to double exposure of IV contrast, we performed shortened dialysis treatment yesterday.  Will continue to monitor renal indices during this weekend.  No plans for further dialysis at this time.  Lab Results  Component Value Date   CREATININE 4.47 (H) 03/25/2024   CREATININE 4.28 (H) 03/24/2024   CREATININE 4.29 (H) 03/24/2024     Intake/Output Summary (Last 24 hours) at 03/26/2024 1406 Last data filed at 03/26/2024 0600 Gross per 24 hour  Intake 220 ml  Output 950 ml  Net -730 ml   2. Anemia of chronic kidney disease Lab Results  Component Value Date   HGB 8.3 (L) 03/25/2024  Hemoglobin 8.3.  Will consider subcu ESA  3.  Hypertension with chronic kidney disease.  Maintain the patient on amlodipine  and clonidine  for now.  Blood pressure within optimal range.   4.  Peripheral vascular disease.  Patient underwent angiogram on 4/29.  Endarterectomy with stent placement on 5/2.    LOS: 2 Lucienne Sawyers 5/2/20252:06 PM

## 2024-03-27 LAB — TYPE AND SCREEN
ABO/RH(D): O POS
Antibody Screen: NEGATIVE
Unit division: 0
Unit division: 0
Unit division: 0
Unit division: 0

## 2024-03-27 LAB — RENAL FUNCTION PANEL
Albumin: 2.5 g/dL — ABNORMAL LOW (ref 3.5–5.0)
Anion gap: 12 (ref 5–15)
BUN: 50 mg/dL — ABNORMAL HIGH (ref 8–23)
CO2: 21 mmol/L — ABNORMAL LOW (ref 22–32)
Calcium: 8.4 mg/dL — ABNORMAL LOW (ref 8.9–10.3)
Chloride: 105 mmol/L (ref 98–111)
Creatinine, Ser: 4.52 mg/dL — ABNORMAL HIGH (ref 0.61–1.24)
GFR, Estimated: 14 mL/min — ABNORMAL LOW (ref >60)
Glucose, Bld: 98 mg/dL (ref 70–99)
Phosphorus: 3.6 mg/dL (ref 2.5–4.6)
Potassium: 3.9 mmol/L (ref 3.5–5.1)
Sodium: 138 mmol/L (ref 135–145)

## 2024-03-27 LAB — BPAM RBC
Blood Product Expiration Date: 202505252359
Blood Product Expiration Date: 202505252359
Blood Product Expiration Date: 202505252359
Blood Product Expiration Date: 202505252359
Unit Type and Rh: 5100
Unit Type and Rh: 5100
Unit Type and Rh: 5100
Unit Type and Rh: 5100

## 2024-03-27 LAB — GLUCOSE, CAPILLARY
Glucose-Capillary: 99 mg/dL (ref 70–99)
Glucose-Capillary: 143 mg/dL — ABNORMAL HIGH (ref 70–99)
Glucose-Capillary: 130 mg/dL — ABNORMAL HIGH (ref 70–99)
Glucose-Capillary: 139 mg/dL — ABNORMAL HIGH (ref 70–99)

## 2024-03-27 NOTE — Progress Notes (Signed)
 [facility]  ROUNDING NOTE   Subjective:   Patient seen laying in bed, covers covering face No family present Untouched breakfast tray at bedside Patient complains of being cold Denies pain or discomfort  Creatinine 4.52  Objective:  Vital signs in last 24 hours:  Temp:  [98.7 F (37.1 C)-99.6 F (37.6 C)] 99.6 F (37.6 C) (05/03 0751) Pulse Rate:  [75-86] 75 (05/03 0751) Resp:  [12-18] 18 (05/03 0751) BP: (107-123)/(54-71) 121/66 (05/03 0751) SpO2:  [95 %-99 %] 98 % (05/03 0751)  Weight change:  Filed Weights   03/24/24 1800 03/25/24 1546 03/25/24 1806  Weight: 80.3 kg 81.4 kg 81.4 kg    Intake/Output: I/O last 3 completed shifts: In: 220 [P.O.:220] Out: 950 [Urine:950]   Intake/Output this shift:  No intake/output data recorded.  Physical Exam: General: NAD  Head: Normocephalic, atraumatic. Moist oral mucosal membranes  Eyes: Blindness  Lungs:  Clear to auscultation, normal effort  Heart: Regular rate and rhythm  Abdomen:  Soft, nontender  Extremities:  1+ peripheral edema.  Neurologic: Alert and oriented, moving all four extremities  Skin: No lesions  Access: Lt upper AVF    Basic Metabolic Panel: Recent Labs  Lab 03/23/24 0932 03/23/24 1538 03/24/24 0526 03/24/24 0526 03/24/24 2333 03/25/24 1606 03/27/24 0810  NA  --   --  141  --  137 134* 138  K  --   --  4.3  --  4.4 3.9 3.9  CL  --   --  109  --  107 106 105  CO2  --   --  21*  --  18* 19* 21*  GLUCOSE  --   --  76  --  265* 111* 98  BUN 68*  --  60*  --  63* 61* 50*  CREATININE 4.85* 4.69* 4.29*  --  4.28* 4.47* 4.52*  CALCIUM   --   --  8.6*   < > 8.1* 8.0* 8.4*  PHOS  --   --   --   --   --  3.4 3.6   < > = values in this interval not displayed.    Liver Function Tests: Recent Labs  Lab 03/24/24 0526 03/25/24 1606 03/27/24 0810  AST 16  --   --   ALT 13  --   --   ALKPHOS 43  --   --   BILITOT 0.5  --   --   PROT 6.1*  --   --   ALBUMIN 2.9* 2.6* 2.5*   No  results for input(s): "LIPASE", "AMYLASE" in the last 168 hours. No results for input(s): "AMMONIA" in the last 168 hours.  CBC: Recent Labs  Lab 03/23/24 1538 03/24/24 0526 03/24/24 2333 03/25/24 1606  WBC 5.4 5.3 10.5 9.0  HGB 11.3* 9.7* 9.7* 8.3*  HCT 35.4* 29.7* 29.5* 26.0*  MCV 85.5 85.1 85.5 86.7  PLT 208 198 191 204    Cardiac Enzymes: No results for input(s): "CKTOTAL", "CKMB", "CKMBINDEX", "TROPONINI" in the last 168 hours.  BNP: Invalid input(s): "POCBNP"  CBG: Recent Labs  Lab 03/26/24 1204 03/26/24 1732 03/26/24 2042 03/27/24 0750 03/27/24 1120  GLUCAP 176* 213* 164* 99 143*    Microbiology: Results for orders placed or performed during the hospital encounter of 03/23/24  MRSA Next Gen by PCR, Nasal     Status: None   Collection Time: 03/24/24  6:05 PM   Specimen: Nasal Mucosa; Nasal Swab  Result Value Ref Range Status   MRSA by  PCR Next Gen NOT DETECTED NOT DETECTED Final    Comment: (NOTE) The GeneXpert MRSA Assay (FDA approved for NASAL specimens only), is one component of a comprehensive MRSA colonization surveillance program. It is not intended to diagnose MRSA infection nor to guide or monitor treatment for MRSA infections. Test performance is not FDA approved in patients less than 4 years old. Performed at [facility], [address] [place], [place] [address]     Coagulation Studies: No results for input(s): "LABPROT", "INR" in the last 72 hours.  Urinalysis: No results for input(s): "COLORURINE", "LABSPEC", "PHURINE", "GLUCOSEU", "HGBUR", "BILIRUBINUR", "KETONESUR", "PROTEINUR", "UROBILINOGEN", "NITRITE", "LEUKOCYTESUR" in the last 72 hours.  Invalid input(s): "APPERANCEUR"    Imaging: No results found.    Medications:    sodium chloride      magnesium  sulfate bolus IVPB      amLODipine   10 mg Oral Daily   aspirin  EC  81 mg Oral Daily   atorvastatin   40 mg Oral QHS   Chlorhexidine  Gluconate Cloth  6 each  Topical Q0600   clopidogrel   75 mg Oral Q0600   docusate sodium   100 mg Oral Daily   famotidine   20 mg Oral Daily   feeding supplement  237 mL Oral BID BM   gabapentin   200 mg Oral BID   insulin  aspart  0-15 Units Subcutaneous TID WC   insulin  aspart  0-5 Units Subcutaneous QHS   metoprolol  succinate  12.5 mg Oral Daily   oxyCODONE   10 mg Oral QHS   primidone   12.5 mg Oral BID   rOPINIRole   0.25 mg Oral QHS   senna-docusate  1 tablet Oral BID   sertraline   50 mg Oral Daily   sodium chloride , alum & mag hydroxide-simeth, guaiFENesin -dextromethorphan, heparin , hydrALAZINE , labetalol , lidocaine -prilocaine , magnesium  sulfate bolus IVPB, metoprolol  tartrate, oxyCODONE , pentafluoroprop-tetrafluoroeth, phenol, potassium chloride , senna-docusate, sorbitol   Assessment/ Plan:  [name] is a 63 y.o.  male with a PMHx of peripheral vascular disease, history of CVA, hypertension, diabetes mellitus type 2, BPH,, history of C7 cervical fracture, GERD, glaucoma, gout, hyperlipidemia, anemia chronic kidney disease, vitamin D  deficiency who was admitted to [facility] on 03/23/2024 for Atherosclerosis of artery of extremity with ulceration (HCC) [I70.299, [name]    Acute kidney injury/chronic kidney disease stage IV baseline creatinine 3.7 with EGFR 17 1/87/25/history of requiring hemodialysis.  Patient was previously requiring hemodialysis.  His baseline renal function showed a creatinine of 3.7 and EGFR 17 on 12/22/2023.  IV contrast exposure on 4/29 and 4/30.     Due to double exposure of IV contrast, we performed shortened dialysis treatment on 5/1.  Creatinine remains the same today, no urine output recorded.  Will continue to monitor renal indices..    Lab Results  Component Value Date   CREATININE 4.52 (H) 03/27/2024   CREATININE 4.47 (H) 03/25/2024   CREATININE 4.28 (H) 03/24/2024    Intake/Output Summary (Last 24 hours) at 03/27/2024 1155 Last data filed at 03/27/2024 0900 Gross per 24 hour   Intake 0 ml  Output --  Net 0 ml   2. Anemia of chronic kidney disease Lab Results  Component Value Date   HGB 8.3 (L) 03/25/2024  Hemoglobin 8.3.  Will continue to monitor and assess need for ESA.  3.  Hypertension with chronic kidney disease.  Maintain the patient on amlodipine  and clonidine  for now.  Blood pressure acceptable, 121/66.   4.  Peripheral vascular disease.  Patient underwent angiogram on 4/29.  Endarterectomy with stent placement  on 5/2.    LOS: 3 Shadaya Marschner 5/3/202511:55 AM

## 2024-03-27 NOTE — Progress Notes (Signed)
    Subjective  - POD #3, status post right common femoral, profunda femoris, and superficial femoral endarterectomies with patch angioplasty. Stents were placed to the left external iliac artery, right common iliac artery, right external iliac artery, right SFA, and right popliteal artery.   No complaints   Physical Exam:  Bilateral groins are soft without hematoma.  Extremities are warm and well-perfused       Assessment/Plan:  POD #3  Plan is for discharge to skilled nursing facility, likely on Monday  Continue aspirin  statin and Plavix   Appreciate renal assistance.  No current plans for further dialysis  Wells Britni Driscoll 03/27/2024 1:33 PM --  Vitals:   03/27/24 0421 03/27/24 0751  BP: (!) 107/54 121/66  Pulse: 77 75  Resp: 12 18  Temp: 99.6 F (37.6 C) 99.6 F (37.6 C)  SpO2: 95% 98%    Intake/Output Summary (Last 24 hours) at 03/27/2024 1333 Last data filed at 03/27/2024 0900 Gross per 24 hour  Intake 0 ml  Output --  Net 0 ml     Laboratory CBC    Component Value Date/Time   WBC 9.0 03/25/2024 1606   HGB 8.3 (L) 03/25/2024 1606   HCT 26.0 (L) 03/25/2024 1606   PLT 204 03/25/2024 1606    BMET    Component Value Date/Time   NA 138 03/27/2024 0810   K 3.9 03/27/2024 0810   CL 105 03/27/2024 0810   CO2 21 (L) 03/27/2024 0810   GLUCOSE 98 03/27/2024 0810   BUN 50 (H) 03/27/2024 0810   CREATININE 4.52 (H) 03/27/2024 0810   CALCIUM  8.4 (L) 03/27/2024 0810   GFRNONAA 14 (L) 03/27/2024 0810   GFRAA >60 07/12/2011 0610    COAG Lab Results  Component Value Date   INR 1.0 12/21/2023   INR 1.3 (H) 11/21/2021   INR 1.06 07/04/2011   No results found for: "PTT"  Antibiotics Anti-infectives (From admission, onward)    Start     Dose/Rate Route Frequency Ordered Stop   03/24/24 1930  ceFAZolin  (ANCEF ) IVPB 2g/100 mL premix        2 g 200 mL/hr over 30 Minutes Intravenous Every 8 hours 03/24/24 1759 03/25/24 0503   03/24/24 1557  vancomycin   (VANCOCIN ) powder  Status:  Discontinued          As needed 03/24/24 1557 03/24/24 1619   03/24/24 1556  gentamicin  (GARAMYCIN ) injection  Status:  Discontinued          As needed 03/24/24 1556 03/24/24 1619   03/23/24 1720  vancomycin  (VANCOCIN ) IVPB 1000 mg/200 mL premix        1,000 mg 200 mL/hr over 60 Minutes Intravenous 60 min pre-op  03/23/24 1720 03/24/24 2107   03/23/24 1035  ceFAZolin  (ANCEF ) IVPB 1 g/50 mL premix        1 g 100 mL/hr over 30 Minutes Intravenous 30 min pre-op  03/23/24 1036 03/23/24 1438   03/23/24 0926  ceFAZolin  (ANCEF ) IVPB 2g/100 mL premix  Status:  Discontinued        2 g 200 mL/hr over 30 Minutes Intravenous 30 min pre-op  03/23/24 0929 03/23/24 1036        V. Marti Slates, M.D., [facility] [place] Office: [phone] Pager:  [phone]

## 2024-03-28 LAB — RENAL FUNCTION PANEL
Albumin: 2.4 g/dL — ABNORMAL LOW (ref 3.5–5.0)
Anion gap: 12 (ref 5–15)
BUN: 59 mg/dL — ABNORMAL HIGH (ref 8–23)
CO2: 22 mmol/L (ref 22–32)
Calcium: 8.4 mg/dL — ABNORMAL LOW (ref 8.9–10.3)
Chloride: 102 mmol/L (ref 98–111)
Creatinine, Ser: 4.76 mg/dL — ABNORMAL HIGH (ref 0.61–1.24)
GFR, Estimated: 13 mL/min — ABNORMAL LOW (ref >60)
Glucose, Bld: 110 mg/dL — ABNORMAL HIGH (ref 70–99)
Phosphorus: 4.1 mg/dL (ref 2.5–4.6)
Potassium: 4 mmol/L (ref 3.5–5.1)
Sodium: 136 mmol/L (ref 135–145)

## 2024-03-28 LAB — GLUCOSE, CAPILLARY
Glucose-Capillary: 152 mg/dL — ABNORMAL HIGH (ref 70–99)
Glucose-Capillary: 169 mg/dL — ABNORMAL HIGH (ref 70–99)
Glucose-Capillary: 144 mg/dL — ABNORMAL HIGH (ref 70–99)
Glucose-Capillary: 107 mg/dL — ABNORMAL HIGH (ref 70–99)

## 2024-03-28 NOTE — Plan of Care (Signed)

## 2024-03-28 NOTE — Progress Notes (Signed)
 [facility]  ROUNDING NOTE   Subjective:   Patient laying in bed Covers remains over his head  Urine output 1.2L Creatinine 4.76 (4.52)  Objective:  Vital signs in last 24 hours:  Temp:  [98.6 F (37 C)-99.5 F (37.5 C)] 98.9 F (37.2 C) (05/04 0814) Pulse Rate:  [56-86] 56 (05/04 0814) Resp:  [16-20] 18 (05/04 0814) BP: (108-136)/(54-93) 108/59 (05/04 0814) SpO2:  [99 %-100 %] 100 % (05/04 0325)  Weight change:  Filed Weights   03/24/24 1800 03/25/24 1546 03/25/24 1806  Weight: 80.3 kg 81.4 kg 81.4 kg    Intake/Output: I/O last 3 completed shifts: In: 240 [P.O.:240] Out: 1200 [Urine:1200]   Intake/Output this shift:  Total I/O In: 240 [P.O.:240] Out: -   Physical Exam: General: NAD  Head: Normocephalic, atraumatic. Moist oral mucosal membranes  Eyes: Blindness  Lungs:  Clear to auscultation, normal effort  Heart: Regular rate and rhythm  Abdomen:  Soft, nontender  Extremities:  1+ peripheral edema  Neurologic: Alert and oriented, moving all four extremities  Skin: No lesions  Access: Lt upper AVF    Basic Metabolic Panel: Recent Labs  Lab 03/24/24 0526 03/24/24 2333 03/25/24 1606 03/27/24 0810 03/28/24 1007  NA 141 137 134* 138 136  K 4.3 4.4 3.9 3.9 4.0  CL 109 107 106 105 102  CO2 21* 18* 19* 21* 22  GLUCOSE 76 265* 111* 98 110*  BUN 60* 63* 61* 50* 59*  CREATININE 4.29* 4.28* 4.47* 4.52* 4.76*  CALCIUM  8.6* 8.1* 8.0* 8.4* 8.4*  PHOS  --   --  3.4 3.6 4.1    Liver Function Tests: Recent Labs  Lab 03/24/24 0526 03/25/24 1606 03/27/24 0810 03/28/24 1007  AST 16  --   --   --   ALT 13  --   --   --   ALKPHOS 43  --   --   --   BILITOT 0.5  --   --   --   PROT 6.1*  --   --   --   ALBUMIN 2.9* 2.6* 2.5* 2.4*   No results for input(s): "LIPASE", "AMYLASE" in the last 168 hours. No results for input(s): "AMMONIA" in the last 168 hours.  CBC: Recent Labs  Lab 03/23/24 1538 03/24/24 0526 03/24/24 2333 03/25/24 1606   WBC 5.4 5.3 10.5 9.0  HGB 11.3* 9.7* 9.7* 8.3*  HCT 35.4* 29.7* 29.5* 26.0*  MCV 85.5 85.1 85.5 86.7  PLT 208 198 191 204    Cardiac Enzymes: No results for input(s): "CKTOTAL", "CKMB", "CKMBINDEX", "TROPONINI" in the last 168 hours.  BNP: Invalid input(s): "POCBNP"  CBG: Recent Labs  Lab 03/27/24 1120 03/27/24 1720 03/27/24 2032 03/28/24 0816 03/28/24 1156  GLUCAP 143* 130* 139* 152* 169*    Microbiology: Results for orders placed or performed during the hospital encounter of 03/23/24  MRSA Next Gen by PCR, Nasal     Status: None   Collection Time: 03/24/24  6:05 PM   Specimen: Nasal Mucosa; Nasal Swab  Result Value Ref Range Status   MRSA by PCR Next Gen NOT DETECTED NOT DETECTED Final    Comment: (NOTE) The GeneXpert MRSA Assay (FDA approved for NASAL specimens only), is one component of a comprehensive MRSA colonization surveillance program. It is not intended to diagnose MRSA infection nor to guide or monitor treatment for MRSA infections. Test performance is not FDA approved in patients less than 73 years old. Performed at [facility], [address]  [place], [place] [address]     Coagulation Studies: No results for input(s): "LABPROT", "INR" in the last 72 hours.  Urinalysis: No results for input(s): "COLORURINE", "LABSPEC", "PHURINE", "GLUCOSEU", "HGBUR", "BILIRUBINUR", "KETONESUR", "PROTEINUR", "UROBILINOGEN", "NITRITE", "LEUKOCYTESUR" in the last 72 hours.  Invalid input(s): "APPERANCEUR"    Imaging: No results found.    Medications:    sodium chloride      magnesium  sulfate bolus IVPB      amLODipine   10 mg Oral Daily   aspirin  EC  81 mg Oral Daily   atorvastatin   40 mg Oral QHS   Chlorhexidine  Gluconate Cloth  6 each Topical Q0600   clopidogrel   75 mg Oral Q0600   docusate sodium   100 mg Oral Daily   famotidine   20 mg Oral Daily   feeding supplement  237 mL Oral BID BM   gabapentin   200 mg Oral BID   insulin  aspart  0-15  Units Subcutaneous TID WC   insulin  aspart  0-5 Units Subcutaneous QHS   metoprolol  succinate  12.5 mg Oral Daily   oxyCODONE   10 mg Oral QHS   primidone   12.5 mg Oral BID   rOPINIRole   0.25 mg Oral QHS   senna-docusate  1 tablet Oral BID   sertraline   50 mg Oral Daily   sodium chloride , alum & mag hydroxide-simeth, guaiFENesin -dextromethorphan, heparin , hydrALAZINE , labetalol , lidocaine -prilocaine , magnesium  sulfate bolus IVPB, metoprolol  tartrate, oxyCODONE , pentafluoroprop-tetrafluoroeth, phenol, potassium chloride , senna-docusate, sorbitol   Assessment/ Plan:  Mr. ABEM STPAUL is a 63 y.o.  male with a PMHx of peripheral vascular disease, history of CVA, hypertension, diabetes mellitus type 2, BPH,, history of C7 cervical fracture, GERD, glaucoma, gout, hyperlipidemia, anemia chronic kidney disease, vitamin D  deficiency who was admitted to [facility] on 03/23/2024 for Atherosclerosis of artery of extremity with ulceration (HCC) [I70.299, [name]    Acute kidney injury/chronic kidney disease stage IV baseline creatinine 3.7 with EGFR 17 1/87/25/history of requiring hemodialysis.  Patient was previously requiring hemodialysis.  His baseline renal function showed a creatinine of 3.7 and EGFR 17 on 12/22/2023.  IV contrast exposure on 4/29 and 4/30.     Due to double exposure of IV contrast, we performed shortened dialysis treatment on 5/1.   Creatinine elevated today. Adequate urine output noted. Will monitor labs in am determine need for further dialysis.    Lab Results  Component Value Date   CREATININE 4.76 (H) 03/28/2024   CREATININE 4.52 (H) 03/27/2024   CREATININE 4.47 (H) 03/25/2024    Intake/Output Summary (Last 24 hours) at 03/28/2024 1200 Last data filed at 03/28/2024 1000 Gross per 24 hour  Intake 480 ml  Output 1200 ml  Net -720 ml   2. Anemia of chronic kidney disease Lab Results  Component Value Date   HGB 8.3 (L) 03/25/2024  Will continue to monitor and assess need for  ESA.  3.  Hypertension with chronic kidney disease.  Maintain the patient on amlodipine  and clonidine  for now.  Blood pressure stable for this patient.    4.  Peripheral vascular disease.  Patient underwent angiogram on 4/29.  Endarterectomy with stent placement on 5/2.    LOS: 4 Shareen Capwell 5/4/202512:00 PM

## 2024-03-29 ENCOUNTER — Encounter: Payer: Self-pay | Admitting: Nurse Practitioner

## 2024-03-29 DIAGNOSIS — L8990 Pressure ulcer of unspecified site, unspecified stage: Secondary | ICD-10-CM | POA: Insufficient documentation

## 2024-03-29 DIAGNOSIS — Z9889 Other specified postprocedural states: Secondary | ICD-10-CM

## 2024-03-29 DIAGNOSIS — L97909 Non-pressure chronic ulcer of unspecified part of unspecified lower leg with unspecified severity: Secondary | ICD-10-CM

## 2024-03-29 LAB — CBC
HCT: 25.1 % — ABNORMAL LOW (ref 39.0–52.0)
Hemoglobin: 8.1 g/dL — ABNORMAL LOW (ref 13.0–17.0)
MCH: 27.6 pg (ref 26.0–34.0)
MCHC: 32.3 g/dL (ref 30.0–36.0)
MCV: 85.7 fL (ref 80.0–100.0)
Platelets: 213 10*3/uL (ref 150–400)
RBC: 2.93 MIL/uL — ABNORMAL LOW (ref 4.22–5.81)
RDW: 13.9 % (ref 11.5–15.5)
WBC: 9 10*3/uL (ref 4.0–10.5)
nRBC: 0 % (ref 0.0–0.2)

## 2024-03-29 LAB — RENAL FUNCTION PANEL
Albumin: 2.5 g/dL — ABNORMAL LOW (ref 3.5–5.0)
Anion gap: 13 (ref 5–15)
BUN: 64 mg/dL — ABNORMAL HIGH (ref 8–23)
CO2: 21 mmol/L — ABNORMAL LOW (ref 22–32)
Calcium: 8.4 mg/dL — ABNORMAL LOW (ref 8.9–10.3)
Chloride: 103 mmol/L (ref 98–111)
Creatinine, Ser: 4.76 mg/dL — ABNORMAL HIGH (ref 0.61–1.24)
GFR, Estimated: 13 mL/min — ABNORMAL LOW (ref >60)
Glucose, Bld: 129 mg/dL — ABNORMAL HIGH (ref 70–99)
Phosphorus: 4.2 mg/dL (ref 2.5–4.6)
Potassium: 4.1 mmol/L (ref 3.5–5.1)
Sodium: 137 mmol/L (ref 135–145)

## 2024-03-29 LAB — GLUCOSE, CAPILLARY
Glucose-Capillary: 97 mg/dL (ref 70–99)
Glucose-Capillary: 166 mg/dL — ABNORMAL HIGH (ref 70–99)

## 2024-03-29 MED ORDER — OXYCODONE HCL 5 MG PO CAPS: 5.0000 mg | ORAL_CAPSULE | ORAL | 0 refills | Status: DC | PRN

## 2024-03-29 MED ORDER — OXYCODONE HCL ER 10 MG PO T12A: 10.0000 mg | EXTENDED_RELEASE_TABLET | Freq: Every day | ORAL | 0 refills | Status: DC

## 2024-03-29 MED ORDER — CLOPIDOGREL BISULFATE 75 MG PO TABS: 75.0000 mg | ORAL_TABLET | Freq: Every day | ORAL | 11 refills | Status: AC

## 2024-03-29 NOTE — Discharge Instructions (Signed)
 Vascular surgery discharge instructions.  Do not lift anything heavy for the next 2 weeks.  Do not lift anything more than a gallon of milk.  Do not drive until your staples are removed.  You may shower when you get home.  Let water  and soap run over your incisions and staple line do not scrub the incision.  Pat completely dry and keep clean daily.  Paint staple line with Betadine sticks once daily until staples removed.  You are being discharged on aspirin  81 mg daily, Plavix  75 mg daily, and Lipitor 40 mg daily.  Patient was counseled not to miss or skip any of these medications as it will adversely affect the outcome of his surgery.  Patient to follow-up with vein and vascular surgery as scheduled.

## 2024-03-29 NOTE — TOC Transition Note (Signed)
 Transition of Care [facility]) - Discharge Note   Patient Details  Name: [name] MRN: [id number] Date of Birth: Mar 20, 1961  [facility] CM/SW Contact:  Odilia Bennett, LCSW Phone Number: 03/29/2024, 2:44 PM   Clinical Narrative: Patient has orders to discharge back to [facility] today. RN has already called report. [facility] has already been arranged and he is 2nd on the list. No further concerns. CSW signing off.    Final next level of care: Skilled Nursing Facility Barriers to Discharge: Barriers Resolved   Patient Goals and CMS Choice     Choice offered to / list presented to : Patient      Discharge Placement   Existing PASRR number confirmed : 03/26/24          Patient chooses bed at: [facility] [place] Patient to be transferred to facility by: [facility] Name of family member notified: Patient stated his family is already aware. Patient and family notified of of transfer: 03/29/24  Discharge Plan and Services Additional resources added to the After Visit Summary for       Post Acute Care Choice: Resumption of Svcs/PTA Provider                               Social Drivers of Health (SDOH) Interventions SDOH Screenings   Food Insecurity: Patient Declined (03/24/2024)  Housing: Patient Declined (03/24/2024)  Transportation Needs: Patient Declined (03/24/2024)  Utilities: Patient Declined (03/24/2024)  Financial Resource Strain: Low Risk  (12/18/2023)   Received from [facility]  Social Connections: Unknown (12/22/2023)  Tobacco Use: High Risk (03/29/2024)     Readmission Risk Interventions    03/26/2024   11:46 AM  Readmission Risk Prevention Plan  PCP or Specialist Appt within 3-5 Days Complete  Social Work Consult for Recovery Care Planning/Counseling Complete  Palliative Care Screening Not Applicable  Medication Review Oceanographer) Complete
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
## 2024-03-29 NOTE — Progress Notes (Signed)
 [facility]  ROUNDING NOTE   Subjective:   Patient laying in bed Nursing at bedside assisting with breakfast  Urine output 1.6L Creatinine remains 4.76   Objective:  Vital signs in last 24 hours:  Temp:  [98.4 F (36.9 C)-98.9 F (37.2 C)] 98.9 F (37.2 C) (05/05 0847) Pulse Rate:  [70-78] 73 (05/05 0847) Resp:  [17-19] 19 (05/05 0847) BP: (110-134)/(55-68) 134/68 (05/05 0847) SpO2:  [97 %-100 %] 100 % (05/05 0847)  Weight change:  Filed Weights   03/24/24 1800 03/25/24 1546 03/25/24 1806  Weight: 80.3 kg 81.4 kg 81.4 kg    Intake/Output: I/O last 3 completed shifts: In: 1220 [P.O.:1220] Out: 2800 [Urine:2800]   Intake/Output this shift:  No intake/output data recorded.  Physical Exam: General: NAD  Head: Normocephalic, atraumatic. Moist oral mucosal membranes  Eyes: Blindness  Lungs:  Clear to auscultation, normal effort  Heart: Regular rate and rhythm  Abdomen:  Soft, nontender  Extremities:  1+ peripheral edema  Neurologic: Alert and oriented, moving all four extremities  Skin: No lesions  Access: Lt upper AVF    Basic Metabolic Panel: Recent Labs  Lab 03/24/24 2333 03/25/24 1606 03/27/24 0810 03/28/24 1007 03/29/24 0444  NA 137 134* 138 136 137  K 4.4 3.9 3.9 4.0 4.1  CL 107 106 105 102 103  CO2 18* 19* 21* 22 21*  GLUCOSE 265* 111* 98 110* 129*  BUN 63* 61* 50* 59* 64*  CREATININE 4.28* 4.47* 4.52* 4.76* 4.76*  CALCIUM  8.1* 8.0* 8.4* 8.4* 8.4*  PHOS  --  3.4 3.6 4.1 4.2    Liver Function Tests: Recent Labs  Lab 03/24/24 0526 03/25/24 1606 03/27/24 0810 03/28/24 1007 03/29/24 0444  AST 16  --   --   --   --   ALT 13  --   --   --   --   ALKPHOS 43  --   --   --   --   BILITOT 0.5  --   --   --   --   PROT 6.1*  --   --   --   --   ALBUMIN 2.9* 2.6* 2.5* 2.4* 2.5*   No results for input(s): "LIPASE", "AMYLASE" in the last 168 hours. No results for input(s): "AMMONIA" in the last 168 hours.  CBC: Recent Labs  Lab  03/23/24 1538 03/24/24 0526 03/24/24 2333 03/25/24 1606 03/29/24 0444  WBC 5.4 5.3 10.5 9.0 9.0  HGB 11.3* 9.7* 9.7* 8.3* 8.1*  HCT 35.4* 29.7* 29.5* 26.0* 25.1*  MCV 85.5 85.1 85.5 86.7 85.7  PLT 208 198 191 204 213    Cardiac Enzymes: No results for input(s): "CKTOTAL", "CKMB", "CKMBINDEX", "TROPONINI" in the last 168 hours.  BNP: Invalid input(s): "POCBNP"  CBG: Recent Labs  Lab 03/28/24 1156 03/28/24 1700 03/28/24 2200 03/29/24 0851 03/29/24 1202  GLUCAP 169* 144* 107* 97 166*    Microbiology: Results for orders placed or performed during the hospital encounter of 03/23/24  MRSA Next Gen by PCR, Nasal     Status: None   Collection Time: 03/24/24  6:05 PM   Specimen: Nasal Mucosa; Nasal Swab  Result Value Ref Range Status   MRSA by PCR Next Gen NOT DETECTED NOT DETECTED Final    Comment: (NOTE) The GeneXpert MRSA Assay (FDA approved for NASAL specimens only), is one component of a comprehensive MRSA colonization surveillance program. It is not intended to diagnose MRSA infection nor to guide or monitor treatment for MRSA infections. Test  performance is not FDA approved in patients less than 52 years old. Performed at [facility], [address] [place], [place] [address]     Coagulation Studies: No results for input(s): "LABPROT", "INR" in the last 72 hours.  Urinalysis: No results for input(s): "COLORURINE", "LABSPEC", "PHURINE", "GLUCOSEU", "HGBUR", "BILIRUBINUR", "KETONESUR", "PROTEINUR", "UROBILINOGEN", "NITRITE", "LEUKOCYTESUR" in the last 72 hours.  Invalid input(s): "APPERANCEUR"    Imaging: No results found.    Medications:    sodium chloride      magnesium  sulfate bolus IVPB      amLODipine   10 mg Oral Daily   aspirin  EC  81 mg Oral Daily   atorvastatin   40 mg Oral QHS   Chlorhexidine  Gluconate Cloth  6 each Topical Q0600   clopidogrel   75 mg Oral Q0600   docusate sodium   100 mg Oral Daily   famotidine   20 mg Oral Daily    feeding supplement  237 mL Oral BID BM   gabapentin   200 mg Oral BID   insulin  aspart  0-15 Units Subcutaneous TID WC   insulin  aspart  0-5 Units Subcutaneous QHS   metoprolol  succinate  12.5 mg Oral Daily   oxyCODONE   10 mg Oral QHS   primidone   12.5 mg Oral BID   rOPINIRole   0.25 mg Oral QHS   senna-docusate  1 tablet Oral BID   sertraline   50 mg Oral Daily   sodium chloride , alum & mag hydroxide-simeth, guaiFENesin -dextromethorphan, heparin , hydrALAZINE , labetalol , lidocaine -prilocaine , magnesium  sulfate bolus IVPB, metoprolol  tartrate, oxyCODONE , pentafluoroprop-tetrafluoroeth, phenol, potassium chloride , senna-docusate, sorbitol   Assessment/ Plan:  [name] is a 63 y.o.  male with a PMHx of peripheral vascular disease, history of CVA, hypertension, diabetes mellitus type 2, BPH,, history of C7 cervical fracture, GERD, glaucoma, gout, hyperlipidemia, anemia chronic kidney disease, vitamin D  deficiency who was admitted to [facility] on 03/23/2024 for Atherosclerosis of artery of extremity with ulceration (HCC) [I70.299, [name]    Acute kidney injury/chronic kidney disease stage IV baseline creatinine 3.7 with EGFR 17 1/87/25/history of requiring hemodialysis.  Patient was previously requiring hemodialysis.  His baseline renal function showed a creatinine of 3.7 and EGFR 17 on 12/22/2023.  IV contrast exposure on 4/29 and 4/30.     Due to double exposure of IV contrast, we performed shortened dialysis treatment on 5/1.   Creatinine remained elevated but stable today. No acute indication for dialysis. Will need continued follow up at discharge. Patient did follow with [facility] in past.   Lab Results  Component Value Date   CREATININE 4.76 (H) 03/29/2024   CREATININE 4.76 (H) 03/28/2024   CREATININE 4.52 (H) 03/27/2024    Intake/Output Summary (Last 24 hours) at 03/29/2024 1419 Last data filed at 03/29/2024 0405 Gross per 24 hour  Intake 740 ml  Output 1600 ml  Net -860 ml   2.  Anemia of chronic kidney disease Lab Results  Component Value Date   HGB 8.1 (L) 03/29/2024  Primary team can consider ESA as outpatient.   3.  Hypertension with chronic kidney disease.  Maintain the patient on amlodipine  and clonidine  for now.  Blood pressure stable    4.  Peripheral vascular disease.  Patient underwent angiogram on 4/29.  Endarterectomy with stent placement on 5/2.    LOS: 5 Kamir Selover 5/5/20252:19 PM

## 2024-03-29 NOTE — Progress Notes (Signed)
    Subjective  - POD #4  No complaints   Physical Exam:  Groin dressing removed Incision c/d/i       Assessment/Plan:  POD #4  Awaiting SNF No further HD planned Needs mobilization  Wells Brien Lowe 03/29/2024 7:11 AM --  Vitals:   03/28/24 2049 03/29/24 0402  BP: (!) 114/55 110/64  Pulse: 70 78  Resp: 17 18  Temp: 98.8 F (37.1 C) 98.4 F (36.9 C)  SpO2: 98% 97%    Intake/Output Summary (Last 24 hours) at 03/29/2024 0711 Last data filed at 03/29/2024 0405 Gross per 24 hour  Intake 980 ml  Output 1600 ml  Net -620 ml     Laboratory CBC    Component Value Date/Time   WBC 9.0 03/29/2024 0444   HGB 8.1 (L) 03/29/2024 0444   HCT 25.1 (L) 03/29/2024 0444   PLT 213 03/29/2024 0444    BMET    Component Value Date/Time   NA 137 03/29/2024 0444   K 4.1 03/29/2024 0444   CL 103 03/29/2024 0444   CO2 21 (L) 03/29/2024 0444   GLUCOSE 129 (H) 03/29/2024 0444   BUN 64 (H) 03/29/2024 0444   CREATININE 4.76 (H) 03/29/2024 0444   CALCIUM  8.4 (L) 03/29/2024 0444   GFRNONAA 13 (L) 03/29/2024 0444   GFRAA >60 07/12/2011 0610    COAG Lab Results  Component Value Date   INR 1.0 12/21/2023   INR 1.3 (H) 11/21/2021   INR 1.06 07/04/2011   No results found for: "PTT"  Antibiotics Anti-infectives (From admission, onward)    Start     Dose/Rate Route Frequency Ordered Stop   03/24/24 1930  ceFAZolin  (ANCEF ) IVPB 2g/100 mL premix        2 g 200 mL/hr over 30 Minutes Intravenous Every 8 hours 03/24/24 1759 03/25/24 0503   03/24/24 1557  vancomycin  (VANCOCIN ) powder  Status:  Discontinued          As needed 03/24/24 1557 03/24/24 1619   03/24/24 1556  gentamicin  (GARAMYCIN ) injection  Status:  Discontinued          As needed 03/24/24 1556 03/24/24 1619   03/23/24 1720  vancomycin  (VANCOCIN ) IVPB 1000 mg/200 mL premix        1,000 mg 200 mL/hr over 60 Minutes Intravenous 60 min pre-op  03/23/24 1720 03/24/24 2107   03/23/24 1035  ceFAZolin  (ANCEF ) IVPB 1 g/50  mL premix        1 g 100 mL/hr over 30 Minutes Intravenous 30 min pre-op  03/23/24 1036 03/23/24 1438   03/23/24 0926  ceFAZolin  (ANCEF ) IVPB 2g/100 mL premix  Status:  Discontinued        2 g 200 mL/hr over 30 Minutes Intravenous 30 min pre-op  03/23/24 0929 03/23/24 1036        V. Marti Slates, M.D., [facility] [place] Office: [phone] Pager:  [phone]

## 2024-03-29 NOTE — Progress Notes (Signed)
 Report given to Lamanda, LPN at [facility]. Awaiting [facility] arrival to transport pt.

## 2024-03-29 NOTE — Plan of Care (Signed)
  Problem: Education: Goal: Knowledge of General Education information will improve Description: Including pain rating scale, medication(s)/side effects and non-pharmacologic comfort measures Outcome: Progressing   Problem: Health Behavior/Discharge Planning: Goal: Ability to manage health-related needs will improve Outcome: Progressing   Problem: Clinical Measurements: Goal: Ability to maintain clinical measurements within normal limits will improve Outcome: Progressing Goal: Will remain free from infection Outcome: Progressing Goal: Diagnostic test results will improve Outcome: Progressing Goal: Respiratory complications will improve Outcome: Progressing Goal: Cardiovascular complication will be avoided Outcome: Progressing   Problem: Activity: Goal: Risk for activity intolerance will decrease Outcome: Progressing   Problem: Nutrition: Goal: Adequate nutrition will be maintained Outcome: Progressing   Problem: Coping: Goal: Level of anxiety will decrease Outcome: Progressing   Problem: Elimination: Goal: Will not experience complications related to bowel motility Outcome: Progressing Goal: Will not experience complications related to urinary retention Outcome: Progressing   Problem: Pain Managment: Goal: General experience of comfort will improve and/or be controlled Outcome: Progressing   Problem: Safety: Goal: Ability to remain free from injury will improve Outcome: Progressing   Problem: Skin Integrity: Goal: Risk for impaired skin integrity will decrease Outcome: Progressing   Problem: Education: Goal: Understanding of CV disease, CV risk reduction, and recovery process will improve Outcome: Progressing Goal: Individualized Educational Video(s) Outcome: Progressing   Problem: Activity: Goal: Ability to return to baseline activity level will improve Outcome: Progressing   Problem: Cardiovascular: Goal: Ability to achieve and maintain adequate  cardiovascular perfusion will improve Outcome: Progressing Goal: Vascular access site(s) Level 0-1 will be maintained Outcome: Progressing   Problem: Health Behavior/Discharge Planning: Goal: Ability to safely manage health-related needs after discharge will improve Outcome: Progressing   Problem: Education: Goal: Ability to describe self-care measures that may prevent or decrease complications (Diabetes Survival Skills Education) will improve Outcome: Progressing Goal: Individualized Educational Video(s) Outcome: Progressing   Problem: Coping: Goal: Ability to adjust to condition or change in health will improve Outcome: Progressing   Problem: Fluid Volume: Goal: Ability to maintain a balanced intake and output will improve Outcome: Progressing   Problem: Health Behavior/Discharge Planning: Goal: Ability to identify and utilize available resources and services will improve Outcome: Progressing Goal: Ability to manage health-related needs will improve Outcome: Progressing   Problem: Metabolic: Goal: Ability to maintain appropriate glucose levels will improve Outcome: Progressing   Problem: Nutritional: Goal: Maintenance of adequate nutrition will improve Outcome: Progressing Goal: Progress toward achieving an optimal weight will improve Outcome: Progressing   Problem: Skin Integrity: Goal: Risk for impaired skin integrity will decrease Outcome: Progressing   Problem: Tissue Perfusion: Goal: Adequacy of tissue perfusion will improve Outcome: Progressing   Problem: Education: Goal: Knowledge of prescribed regimen will improve Outcome: Progressing   Problem: Activity: Goal: Ability to tolerate increased activity will improve Outcome: Progressing   Problem: Bowel/Gastric: Goal: Gastrointestinal status for postoperative course will improve Outcome: Progressing   Problem: Clinical Measurements: Goal: Postoperative complications will be avoided or  minimized Outcome: Progressing Goal: Signs and symptoms of graft occlusion will improve Outcome: Progressing   Problem: Skin Integrity: Goal: Demonstration of wound healing without infection will improve Outcome: Progressing

## 2024-04-15 ENCOUNTER — Other Ambulatory Visit (INDEPENDENT_AMBULATORY_CARE_PROVIDER_SITE_OTHER): Payer: Self-pay | Admitting: Vascular Surgery

## 2024-04-15 DIAGNOSIS — I739 Peripheral vascular disease, unspecified: Secondary | ICD-10-CM

## 2024-04-20 ENCOUNTER — Encounter (INDEPENDENT_AMBULATORY_CARE_PROVIDER_SITE_OTHER): Payer: Self-pay | Admitting: Nurse Practitioner

## 2024-04-20 ENCOUNTER — Ambulatory Visit (INDEPENDENT_AMBULATORY_CARE_PROVIDER_SITE_OTHER)

## 2024-04-20 ENCOUNTER — Ambulatory Visit (INDEPENDENT_AMBULATORY_CARE_PROVIDER_SITE_OTHER): Admitting: Nurse Practitioner

## 2024-04-20 VITALS — BP 177/92 | HR 73 | Resp 18 | Ht 74.0 in | Wt 179.0 lb

## 2024-04-20 DIAGNOSIS — I739 Peripheral vascular disease, unspecified: Secondary | ICD-10-CM | POA: Diagnosis not present

## 2024-04-20 DIAGNOSIS — Z9889 Other specified postprocedural states: Secondary | ICD-10-CM | POA: Diagnosis not present

## 2024-04-20 DIAGNOSIS — I70221 Atherosclerosis of native arteries of extremities with rest pain, right leg: Secondary | ICD-10-CM
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
## 2024-04-22 LAB — VAS US ABI WITH/WO TBI
Left ABI: 1.08
Right ABI: 1.08

## 2024-04-23 ENCOUNTER — Telehealth (INDEPENDENT_AMBULATORY_CARE_PROVIDER_SITE_OTHER): Payer: Self-pay

## 2024-04-23 NOTE — Telephone Encounter (Signed)
 Doesn't necessarily need to be Monday but someday next week is fine.

## 2024-04-23 NOTE — Telephone Encounter (Signed)
 Brandy from [facility] called concerning pt having staples removed but he still has 2 stitches.  The stitches are on the left side of groin just adjacent to the staples that were removed.  Please advise

## 2024-04-23 NOTE — Telephone Encounter (Signed)
 When I removed the staples the patient and aid said there was nothing on the right.  Are the stitches clear? If so they will dissolve

## 2024-04-27 ENCOUNTER — Ambulatory Visit (INDEPENDENT_AMBULATORY_CARE_PROVIDER_SITE_OTHER): Admitting: Nurse Practitioner

## 2024-04-29 ENCOUNTER — Ambulatory Visit (INDEPENDENT_AMBULATORY_CARE_PROVIDER_SITE_OTHER): Admitting: Nurse Practitioner

## 2024-04-30 ENCOUNTER — Encounter (INDEPENDENT_AMBULATORY_CARE_PROVIDER_SITE_OTHER): Payer: Self-pay | Admitting: Vascular Surgery

## 2024-04-30 ENCOUNTER — Ambulatory Visit (INDEPENDENT_AMBULATORY_CARE_PROVIDER_SITE_OTHER): Admitting: Vascular Surgery

## 2024-04-30 VITALS — BP 163/83 | HR 74 | Resp 16

## 2024-04-30 DIAGNOSIS — Z9889 Other specified postprocedural states: Secondary | ICD-10-CM

## 2024-04-30 DIAGNOSIS — I739 Peripheral vascular disease, unspecified: Secondary | ICD-10-CM
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
## 2024-05-11 ENCOUNTER — Ambulatory Visit (INDEPENDENT_AMBULATORY_CARE_PROVIDER_SITE_OTHER): Admitting: Vascular Surgery

## 2024-06-21 ENCOUNTER — Ambulatory Visit: Admitting: Physician Assistant

## 2024-07-13 ENCOUNTER — Encounter: Attending: Physician Assistant | Admitting: Physician Assistant

## 2024-07-13 DIAGNOSIS — I2510 Atherosclerotic heart disease of native coronary artery without angina pectoris: Secondary | ICD-10-CM | POA: Insufficient documentation

## 2024-07-13 DIAGNOSIS — L84 Corns and callosities: Secondary | ICD-10-CM | POA: Diagnosis not present

## 2024-07-13 DIAGNOSIS — E11628 Type 2 diabetes mellitus with other skin complications: Secondary | ICD-10-CM | POA: Insufficient documentation

## 2024-07-13 DIAGNOSIS — H548 Legal blindness, as defined in USA: Secondary | ICD-10-CM | POA: Diagnosis not present

## 2024-07-13 DIAGNOSIS — E1143 Type 2 diabetes mellitus with diabetic autonomic (poly)neuropathy: Secondary | ICD-10-CM | POA: Diagnosis not present

## 2024-07-13 DIAGNOSIS — I10 Essential (primary) hypertension: Secondary | ICD-10-CM | POA: Diagnosis not present

## 2024-07-13 DIAGNOSIS — I7389 Other specified peripheral vascular diseases: Secondary | ICD-10-CM | POA: Diagnosis not present

## 2024-07-16 NOTE — Progress Notes (Signed)
 New Patient Visit   Chief Complaint: Chief Complaint  Patient presents with  . Hypertension  . Follow-up   Date of Service: 07/16/2024 Date of Birth: [birth year] PCP: Eilleen Joen Fritz, MD [address] [facility] [place] [address]  History of Present Illness:  History of Present Illness [name] is a 63 year old male with coronary artery disease and hypertension who presents for a cardiovascular follow-up visit.  He feels a little tired today but otherwise has no new issues. No chest pain, shortness of breath, or palpitations.  He mentions that the medication for his tremor is not effective, but acknowledges that this visit is focused on his heart condition.  No new symptoms related to peripheral artery disease are reported.   Past Medical and Surgical History  Past Medical History Past Medical History:  Diagnosis Date  . Anxiety   . Blind   . CHF (congestive heart failure) (CMS/HHS-HCC)   . Clotting disorder (CMS/HHS-HCC)   . Diabetes mellitus without complication (CMS/HHS-HCC)   . GERD (gastroesophageal reflux disease)   . Gout   . Hyperlipidemia   . Hypertension   . Ischemic heart disease   . Muscle weakness   . Muscle weakness   . Ocular laceration   . 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CMS/HHS-HCC)   . 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CMS/HHS-HCC)     Past Surgical History He has a past surgical history that includes Replacement total hip w/  resurfacing implants and orbital eye surgery.   Medications and Allergies  Current Medications  Current Outpatient Medications on File Prior to Visit  Medication Sig Dispense Refill  . amLODIPine  (NORVASC ) 10 MG tablet Take 10 mg by mouth once daily    . ammonium lactate (LAC-HYDRIN) 12 % lotion Apply topically 2 (two) times daily    . ascorbic acid , vitamin C , (VITAMIN C ) 500 MG tablet Take 250 mg by mouth 2 (two) times daily    . aspirin  81 MG EC tablet Take 81 mg by mouth once daily    . atorvastatin  (LIPITOR) 40 MG tablet Take  20 mg by mouth once daily    . cholecalciferol  (VITAMIN D3) 1000 unit tablet Take 2,000 Units by mouth once daily    . clopidogreL  (PLAVIX ) 75 mg tablet Take 75 mg by mouth once daily    . desonide (DESOWEN) 0.05 % ointment Apply 1 Application topically 2 (two) times daily    . ferrous sulfate  325 (65 FE) MG tablet Take 325 mg by mouth daily with breakfast    . ketoconazole (NIZORAL) 2 % shampoo Apply topically once Leave on for 5 minutes, then rinse.    . lidocaine  (LIDODERM ) 5 % patch Place 1 patch onto the skin daily Apply patch to the most painful area for up to 12 hours in a 24 hour period.    . metFORMIN (GLUCOPHAGE) 500 MG tablet Take 500 mg by mouth 2 (two) times daily with meals    . multivitamin tablet Take 1 tablet by mouth once daily    . naloxone (NARCAN) 4 mg/actuation nasal spray Place 4 mg into one nostril    . oxyCODONE  (OXYCONTIN ) 10 MG CR tablet Take 10 mg by mouth at bedtime In addition to 5 mg PRN    . pregabalin  (LYRICA ) 75 MG capsule Stop gabapentin .  Start Lyrica  75 mg nightly 30 capsule 3  . primidone  (MYSOLINE ) 50 MG tablet Take 12.5mg  (1/4 tablet) twice daily for tremor 15 tablet 5  . rOPINIRole  (REQUIP ) 0.25 MG immediate  release tablet Take 0.25 mg by mouth at bedtime    . sennosides (SENOKOT) 8.6 mg tablet Take 1 tablet by mouth 2 (two) times daily    . sertraline  (ZOLOFT ) 25 MG tablet Take 25 mg by mouth once daily    . sertraline  (ZOLOFT ) 50 MG tablet Take 75 mg by mouth once daily    . losartan  (COZAAR ) 100 MG tablet Take 100 mg by mouth once daily (Patient not taking: Reported on 06/09/2024)    . losartan  (COZAAR ) 50 MG tablet Take 1 tablet by mouth once daily (Patient not taking: Reported on 06/09/2024)    . magnesium  hydroxide (MILK OF MAGNESIA) 400 mg/5 mL suspension Take by mouth once daily as needed for Constipation    . multivitamin with iron (CENTRUM ADULT COMPLETE) tablet Take 1 tablet by mouth once daily (Patient not taking: Reported on 06/09/2024)    .  oxyCODONE  5 mg capsule Take 5 mg by mouth every 4 (four) hours as needed for Pain (Patient not taking: Reported on 07/16/2024)     No current facility-administered medications on file prior to visit.    Allergies: Patient has no known allergies.  Social and Family History  Social History  reports that he has been smoking cigarettes. He has a 12.5 pack-year smoking history. He has never used smokeless tobacco. He reports that he does not drink alcohol and does not use drugs.  Family History Family History  Problem Relation Name Age of Onset  . Cancer Mother    . Cancer Father      Review of Systems   Review of Systems  Cardiovascular:  Negative for chest pain, palpitations, orthopnea, claudication, leg swelling and PND.      Physical Examination   Vitals:BP (!) 140/90 (BP Location: Right upper arm, Patient Position: Sitting, BP Cuff Size: Small Adult)   Pulse 79   Ht 185.4 cm (6' 1)   Wt 81.6 kg (180 lb)   SpO2 96%   BMI 23.75 kg/m  Ht:185.4 cm (6' 1) Wt:81.6 kg (180 lb) ADJ:Anib surface area is 2.05 meters squared. Body mass index is 23.75 kg/m.  Physical Exam Vitals reviewed.  HENT:     Head: Normocephalic and atraumatic.  Cardiovascular:     Rate and Rhythm: Normal rate and regular rhythm.     Pulses: Normal pulses.     Heart sounds: Normal heart sounds. No murmur heard. Pulmonary:     Effort: Pulmonary effort is normal.     Breath sounds: Normal breath sounds.  Abdominal:     General: Bowel sounds are normal.  Musculoskeletal:     Right lower leg: No edema.     Left lower leg: No edema.  Skin:    General: Skin is warm and dry.  Neurological:     Mental Status: He is alert.       Data & Results   No results for input(s): CHOLTOTAL, HDL, LDLCALC, LDLDIRECT, LDL, VLDL, TRIG in the last 73719 hours.  No results for input(s): NA, K, BUN, CREATININE, CO2, GLUCOSE, GLUF, ALT, AST, TBILI, ALB in the last 73719 hours.   No results for input(s): WBC, HGB, HCT, MCV, PLT in the last 73719 hours.  No results for input(s): INR, TSH, HGBA1C, HBA1C, PROBNP, BNP, HSTNI, DELTAHSTNI, HSTNIINTERP, TROPONINT, CK, CKMB, DDIMER in the last 73719 hours.      Assessment   63 y.o. male with  Encounter Diagnoses  Name Primary?  . Mixed hyperlipidemia Yes  . Coronary artery disease involving  native coronary artery of native heart without angina pectoris   . HTN, goal below 130/80     12/22/2023 ECHO:  1. Left ventricular ejection fraction, by estimation, is 55 to 60%. The left ventricle has normal function. The left ventricle has no regional wall motion abnormalities. Left ventricular diastolic parameters are consistent with Grade I diastolic  dysfunction (impaired relaxation).   2. Right ventricular systolic function is normal. The right ventricular size is normal.   3. The mitral valve is normal in structure. Mild mitral valve regurgitation. No evidence of mitral stenosis.   4. The aortic valve is normal in structure. Aortic valve regurgitation is not visualized. No aortic stenosis is present.   5. The inferior vena cava is normal in size with greater than 50% respiratory variability, suggesting right atrial pressure of 3 mmHg.   6. Agitated saline contrast bubble study was negative, with no evidence of any interatrial shunt.     Plan   No orders of the defined types were placed in this encounter.  Assessment & Plan Post-surgical care for vascular surgery He is in the post-operative phase following extensive vascular surgery, including the placement of two stents and an endarterectomy. Staples and stitches are present in both groins, with a concern for potential infection if they remain too long. The surgery was on April 30, and it has been almost a month since the procedure. He has an appointment on Apr 20, 2024, for staple and stitch removal and a lower extremity ultrasound. -  Remove staples and stitches at the appointment on Apr 20, 2024. - Coordinate with vascular surgery for wound care and follow-up.  Coronary artery disease without angina Coronary artery disease is well-managed without angina.  - Continue current medications for coronary artery disease. - Schedule follow-up appointment in six months.  Hypertension Hypertension. No reported issues with medications. - Continue current antihypertensive medications. - Schedule follow-up appointment in six months.  Return in about 6 months (around 01/16/2025).   Attestation Statement:   I personally performed the service, non-incident to. (WP)   SABINA CUSTOVIC, DO    Sabina Custovic, DO

## 2024-07-21 ENCOUNTER — Ambulatory Visit: Attending: Pain Medicine | Admitting: Pain Medicine

## 2024-07-21 ENCOUNTER — Encounter: Payer: Self-pay | Admitting: Pain Medicine

## 2024-07-21 VITALS — BP 140/74 | HR 77 | Temp 97.5°F | Resp 20 | Ht 73.0 in | Wt 180.0 lb

## 2024-07-21 DIAGNOSIS — S12600S Unspecified displaced fracture of seventh cervical vertebra, sequela: Secondary | ICD-10-CM | POA: Insufficient documentation

## 2024-07-21 DIAGNOSIS — H548 Legal blindness, as defined in USA: Secondary | ICD-10-CM | POA: Diagnosis present

## 2024-07-21 DIAGNOSIS — R937 Abnormal findings on diagnostic imaging of other parts of musculoskeletal system: Secondary | ICD-10-CM | POA: Diagnosis present

## 2024-07-21 DIAGNOSIS — G8929 Other chronic pain: Secondary | ICD-10-CM | POA: Diagnosis present

## 2024-07-21 DIAGNOSIS — Z789 Other specified health status: Secondary | ICD-10-CM | POA: Diagnosis present

## 2024-07-21 DIAGNOSIS — Z7901 Long term (current) use of anticoagulants: Secondary | ICD-10-CM | POA: Insufficient documentation

## 2024-07-21 DIAGNOSIS — K219 Gastro-esophageal reflux disease without esophagitis: Secondary | ICD-10-CM | POA: Diagnosis present

## 2024-07-21 DIAGNOSIS — I739 Peripheral vascular disease, unspecified: Secondary | ICD-10-CM | POA: Insufficient documentation

## 2024-07-21 DIAGNOSIS — M899 Disorder of bone, unspecified: Secondary | ICD-10-CM | POA: Diagnosis present

## 2024-07-21 DIAGNOSIS — N185 Chronic kidney disease, stage 5: Secondary | ICD-10-CM | POA: Diagnosis present

## 2024-07-21 DIAGNOSIS — I2510 Atherosclerotic heart disease of native coronary artery without angina pectoris: Secondary | ICD-10-CM | POA: Insufficient documentation

## 2024-07-21 DIAGNOSIS — M79672 Pain in left foot: Secondary | ICD-10-CM | POA: Diagnosis present

## 2024-07-21 DIAGNOSIS — I6930 Unspecified sequelae of cerebral infarction: Secondary | ICD-10-CM | POA: Diagnosis present

## 2024-07-21 DIAGNOSIS — R930 Abnormal findings on diagnostic imaging of skull and head, not elsewhere classified: Secondary | ICD-10-CM | POA: Insufficient documentation

## 2024-07-21 DIAGNOSIS — E119 Type 2 diabetes mellitus without complications: Secondary | ICD-10-CM | POA: Diagnosis present

## 2024-07-21 DIAGNOSIS — G894 Chronic pain syndrome: Secondary | ICD-10-CM | POA: Insufficient documentation

## 2024-07-21 DIAGNOSIS — Z79899 Other long term (current) drug therapy: Secondary | ICD-10-CM | POA: Insufficient documentation

## 2024-07-21 DIAGNOSIS — H9193 Unspecified hearing loss, bilateral: Secondary | ICD-10-CM | POA: Insufficient documentation

## 2024-07-21 DIAGNOSIS — G3184 Mild cognitive impairment, so stated: Secondary | ICD-10-CM | POA: Diagnosis present

## 2024-07-21 DIAGNOSIS — I10 Essential (primary) hypertension: Secondary | ICD-10-CM | POA: Diagnosis present

## 2024-07-21 DIAGNOSIS — K739 Chronic hepatitis, unspecified: Secondary | ICD-10-CM | POA: Diagnosis present
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
## 2024-07-22 ENCOUNTER — Telehealth: Payer: Self-pay

## 2024-07-22 NOTE — Telephone Encounter (Signed)
 Dr Tanya would like clearance to stop this patients Plavix  for 7 days prior to having procedures.  Thank you for your time.

## 2024-07-29 ENCOUNTER — Encounter: Payer: Self-pay | Admitting: Pain Medicine

## 2024-07-29 ENCOUNTER — Other Ambulatory Visit (INDEPENDENT_AMBULATORY_CARE_PROVIDER_SITE_OTHER): Payer: Self-pay | Admitting: Vascular Surgery

## 2024-07-29 DIAGNOSIS — R700 Elevated erythrocyte sedimentation rate: Secondary | ICD-10-CM | POA: Insufficient documentation

## 2024-07-29 DIAGNOSIS — Z9889 Other specified postprocedural states: Secondary | ICD-10-CM

## 2024-07-29 DIAGNOSIS — R799 Abnormal finding of blood chemistry, unspecified: Secondary | ICD-10-CM | POA: Insufficient documentation

## 2024-07-29 DIAGNOSIS — R7982 Elevated C-reactive protein (CRP): Secondary | ICD-10-CM | POA: Insufficient documentation

## 2024-07-29 DIAGNOSIS — R944 Abnormal results of kidney function studies: Secondary | ICD-10-CM | POA: Insufficient documentation

## 2024-07-29 DIAGNOSIS — R7989 Other specified abnormal findings of blood chemistry: Secondary | ICD-10-CM | POA: Insufficient documentation

## 2024-07-29 LAB — 25-HYDROXY VITAMIN D LCMS D2+D3
25-Hydroxy, Vitamin D-2: 2.1 ng/mL
25-Hydroxy, Vitamin D-3: 40 ng/mL
25-Hydroxy, Vitamin D: 42 ng/mL

## 2024-07-29 LAB — C-REACTIVE PROTEIN: CRP: 43 mg/L — ABNORMAL HIGH (ref 0–10)

## 2024-07-29 LAB — COMP. METABOLIC PANEL (12)
AST: 22 IU/L (ref 0–40)
Albumin: 4.1 g/dL (ref 3.9–4.9)
Alkaline Phosphatase: 73 IU/L (ref 44–121)
BUN/Creatinine Ratio: 13 (ref 10–24)
BUN: 79 mg/dL (ref 8–27)
Bilirubin Total: 0.2 mg/dL (ref 0.0–1.2)
Calcium: 9.1 mg/dL (ref 8.6–10.2)
Chloride: 105 mmol/L (ref 96–106)
Creatinine, Ser: 5.99 mg/dL — ABNORMAL HIGH (ref 0.76–1.27)
Globulin, Total: 2.8 g/dL (ref 1.5–4.5)
Glucose: 93 mg/dL (ref 70–99)
Potassium: 4.4 mmol/L (ref 3.5–5.2)
Sodium: 143 mmol/L (ref 134–144)
Total Protein: 6.9 g/dL (ref 6.0–8.5)
eGFR: 10 mL/min/1.73 — ABNORMAL LOW (ref >59)

## 2024-07-29 LAB — VITAMIN B12: Vitamin B-12: 915 pg/mL (ref 232–1245)

## 2024-07-29 LAB — SEDIMENTATION RATE: Sed Rate: 62 mm/h — ABNORMAL HIGH (ref 0–30)

## 2024-07-29 LAB — MAGNESIUM: Magnesium: 2 mg/dL (ref 1.6–2.3)

## 2024-08-02 ENCOUNTER — Ambulatory Visit (INDEPENDENT_AMBULATORY_CARE_PROVIDER_SITE_OTHER): Admitting: Nurse Practitioner

## 2024-08-02 ENCOUNTER — Encounter (INDEPENDENT_AMBULATORY_CARE_PROVIDER_SITE_OTHER): Payer: Self-pay | Admitting: Nurse Practitioner

## 2024-08-02 ENCOUNTER — Ambulatory Visit (INDEPENDENT_AMBULATORY_CARE_PROVIDER_SITE_OTHER)

## 2024-08-02 VITALS — BP 161/90 | HR 77

## 2024-08-02 DIAGNOSIS — E1121 Type 2 diabetes mellitus with diabetic nephropathy: Secondary | ICD-10-CM | POA: Diagnosis not present

## 2024-08-02 DIAGNOSIS — Z9889 Other specified postprocedural states: Secondary | ICD-10-CM

## 2024-08-02 DIAGNOSIS — I10 Essential (primary) hypertension: Secondary | ICD-10-CM

## 2024-08-02 DIAGNOSIS — I739 Peripheral vascular disease, unspecified: Secondary | ICD-10-CM

## 2024-08-04 LAB — VAS US ABI WITH/WO TBI
Left ABI: 1.07
Right ABI: 1.04

## 2024-08-09 ENCOUNTER — Encounter (INDEPENDENT_AMBULATORY_CARE_PROVIDER_SITE_OTHER): Payer: Self-pay | Admitting: Nurse Practitioner
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
## 2024-08-25 ENCOUNTER — Ambulatory Visit
Admission: RE | Admit: 2024-08-25 | Discharge: 2024-08-25 | Disposition: A | Source: Ambulatory Visit | Attending: Pain Medicine | Admitting: Pain Medicine

## 2024-08-25 ENCOUNTER — Ambulatory Visit
Admission: RE | Admit: 2024-08-25 | Discharge: 2024-08-25 | Disposition: A | Discharge status: Home / Self Care | Attending: Pain Medicine | Admitting: Pain Medicine

## 2024-08-25 DIAGNOSIS — G8929 Other chronic pain: Secondary | ICD-10-CM

## 2024-08-25 DIAGNOSIS — G894 Chronic pain syndrome: Secondary | ICD-10-CM

## 2024-08-25 DIAGNOSIS — M79672 Pain in left foot: Secondary | ICD-10-CM | POA: Insufficient documentation

## 2024-11-09 NOTE — Progress Notes (Signed)
 Follow Up Visit   Patient Name: [name], male   Patient DOB: [birth year] Date of Service: 11/09/2024  Patient MRN: [id number] Provider Creating Note: Bonnell Sherry, MD  [phone] Primary Care Physician:   [address] [place] [address] Additional Physicians/ Providers:    History of Present Illness [name] is a 63 y.o. male who is following up today for acute kidney injury, chronic kidney disease stage IV, anemia of chronic kidney disease, and hypertension.  We saw the patient for acute kidney injury at his last admission as he had multiple contrast exposures and has severe peripheral vascular disease status post intervention.  When he left the hospital his creatinine was 4.76 previously.  In the interim he had labs performed on 07/21/2024 at which point in time eGFR was down to 10.  He also has prior history of anemia of chronic kidney disease that will require follow-up today.  In regards hypertension blood pressure currently 136/80.   Medications   Current Outpatient Medications:    clopidogrel  (PLAVIX ) 75 MG tablet, Take 75 mg by mouth, Disp: , Rfl:    pregabalin  (LYRICA ) 75 MG capsule, Take 75 mg by mouth, Disp: , Rfl:    primidone  (MYSOLINE ) 50 MG tablet, Take 12.5 mg by mouth, Disp: , Rfl:    amLODIPine  (NORVASC ) 10 MG tablet, Take 10 mg by mouth 1 (one) time each day, Disp: , Rfl:    ammonium lactate (LAC-HYDRIN) 12 % lotion, Apply 1 Application topically, Disp: , Rfl:    ascorbic acid  (VITAMIN C ) 500 MG tablet, Take 250 mg by mouth, Disp: , Rfl:    aspirin  (ST JOSEPH) 81 MG EC tablet, Take 81 mg by mouth, Disp: , Rfl:    atorvastatin  (LIPITOR) 40 MG tablet, Take 40 mg by mouth every night, Disp: , Rfl:    cholecalciferol  (VITAMIN D -3) 25 MCG (1000 UT) tablet, Take 2,000 Units by mouth 1 (one) time each day, Disp: , Rfl:    ferrous sulfate  325 (65 Fe) MG tablet, Take 325 mg by mouth, Disp: , Rfl:    magnesium  hydroxide (MILK OF MAGNESIA) 400 MG/5ML  suspension, Take 5 mL by mouth, Disp: , Rfl:    metFORMIN (GLUCOPHAGE) 500 MG tablet, Take 500 mg by mouth, Disp: , Rfl:    Naloxone HCl 4 MG/0.1ML liquid, Administer 4 mg into affected nostril(s), Disp: , Rfl:    oxyCODONE  (OxyCONTIN ) 10 MG 12 hr abuse-deterrent tablet, Take 10 mg by mouth every night, Disp: , Rfl:    oxyCODONE  (ROXICODONE ) 10 MG immediate release tablet, Take 10 mg by mouth, Disp: , Rfl:    rOPINIRole  (REQUIP ) 0.25 MG tablet, Take 0.25 mg by mouth, Disp: , Rfl:    senna (SENOKOT) 8.6 MG tablet, Take 1 tablet by mouth in the morning and 1 tablet in the evening., Disp: , Rfl:    sertraline  (ZOLOFT ) 50 MG tablet, Take 50 mg by mouth 1 (one) time each day, Disp: , Rfl:    Allergies Patient has no known allergies.  Problem List There is no problem list on file for this patient.    Review of Systems  Constitutional:  Negative for chills and fever.  Respiratory:  Negative for cough and shortness of breath.   Cardiovascular:  Negative for chest pain and palpitations.  Gastrointestinal:  Negative for nausea and vomiting.  Genitourinary:  Negative for dysuria, hematuria and urgency.  Neurological:  Positive for weakness.     History Past Medical History:  Diagnosis Date  Cerebral artery occlusion, unspecified, with cerebral infarction [facility])    Chronic kidney disease    Coronary atherosclerosis of unspecified type of vessel, native or graft     History reviewed. No pertinent surgical history. History reviewed. No pertinent family history. Social History   Tobacco Use   Smoking status: Not on file   Smokeless tobacco: Not on file  Substance Use Topics   Alcohol use: Not on file        Physical Exam  Vitals BP 136/80 (BP Location: Right upper arm, Patient Position: Sitting)   Pulse 86   SpO2 94%   PHYSICAL EXAM: General appearance: well developed, well nourished, NAD Eyes: anicteric sclerae, moist conjunctivae; no lid-lag  HENT:  Atraumatic; hearing intact Neck: Trachea midline; supple Lungs: CTAB, with normal respiratory effort  CV: S1S2, no murmurs or rubs. Abdomen: Soft, non-tender; bowel sounds present Extremities: No peripheral edema Skin: Warm and dry, normal skin turgor, no rashes noted. Psych: Appropriate affect, alert and oriented to person, place and time    Laboratory Studies      No lab exists for component: GFR      No lab exists for component: IRON SATURATION, TRANSSATPER          No lab exists for component: GLUCOSEUR, BILIRUBINUR, SPECGRAV, RBCUR, LEUKOCYTESUR, NITRITE  Lab Results  Component Value Date   PTH 168.4 (H) 12/31/2021   CALCIUM  9.6 12/31/2021   PHOS 3.8 12/31/2021     Imaging and Other Studies     Orders Placed This Encounter   Renal Function Panel   CBC and Differential   PTH, Intact       Impression/Recommendations  [name] is a 63 y.o. male with past medical history of severe peripheral vascular disease, history of CVA, hypertension, diabetes mellitus type 2, BPH, history of C7 cervical fracture, GERD, glaucoma, gout, hyperlipidemia, anemia of chronic disease, vitamin D  deficiency who returns today in follow-up of acute kidney injury in the setting of known chronic kidney disease stage IV.  1.  Acute kidney injury/chronic kidney disease stage IV.  Patient has been lost to follow-up since May 2025.  When he left the hospital back in May his creatinine was 4.76 with an eGFR 13.  He subsequently had labs in late August and eGFR at that time was 10.  Patient may end up requiring renal replacement therapy.  We do plan to follow renal parameters today.  2.  Anemia of chronic kidney disease.  Most recent hemoglobin on file was 8.3.  Repeat CBC today.  May need to consider starting Epogen.  3.  Hypertension.  Blood pressure under reasonable control 136/80.  Patient to be maintained on current dosage of amlodipine .   Return in about 6  weeks (around 12/21/2024).   Munsoor Lateef, MD

## 2024-11-23 NOTE — Progress Notes (Signed)
 Follow Up Visit   Patient Name: [name], male   Patient DOB: 06/30/1961 Date of Service: 11/23/2024  Patient MRN: [id number] Provider Creating Note: Bonnell Sherry, MD  [phone] Primary Care Physician:   [address] [place] [address] Additional Physicians/ Providers:    History of Present Illness [name] is a 63 y.o. male who is following up today for ESRD, anemia chronic kidney disease, hypertension, and secondary hyperparathyroidism.  At the last visit he was found to have significantly worsening renal function.  Creatinine was up to 8.2 with an eGFR of 7.  Patient has significant fatigue and lower extremity edema.  He has associated anemia of chronic kidney disease with hemoglobin down to 8.7.  He also has hypertension with blood pressure 118/70.  Finally he also has significant secondary hyperparathyroidism with most recent PTH of 410, phosphorus 5.8, calcium  9.3.  We talked about this at length today and discussed all treatment options including no treatment versus progressing to renal placement therapy in the form of hemodialysis.  Ultimately he decided upon renal placement therapy.  Medications   Current Outpatient Medications:    lisinopril 10 MG tablet, , Disp: , Rfl:    amLODIPine  (NORVASC ) 10 MG tablet, Take 10 mg by mouth 1 (one) time each day, Disp: , Rfl:    ammonium lactate (LAC-HYDRIN) 12 % lotion, Apply 1 Application topically, Disp: , Rfl:    ascorbic acid  (VITAMIN C ) 500 MG tablet, Take 250 mg by mouth, Disp: , Rfl:    aspirin  (ST JOSEPH) 81 MG EC tablet, Take 81 mg by mouth, Disp: , Rfl:    atorvastatin  (LIPITOR) 40 MG tablet, Take 40 mg by mouth every night, Disp: , Rfl:    cholecalciferol  (VITAMIN D -3) 25 MCG (1000 UT) tablet, Take 2,000 Units by mouth 1 (one) time each day, Disp: , Rfl:    clopidogrel  (PLAVIX ) 75 MG tablet, Take 75 mg by mouth, Disp: , Rfl:    Ferrous Sulfate  (IRON PO), Take 1 tablet by mouth in the morning., Disp: , Rfl:     ferrous sulfate  325 (65 Fe) MG tablet, Take 325 mg by mouth, Disp: , Rfl:    lidocaine  (LIDODERM ) 5 % patch, Place 1 patch on the skin, Disp: , Rfl:    magnesium  hydroxide (MILK OF MAGNESIA) 400 MG/5ML suspension, Take 5 mL by mouth, Disp: , Rfl:    Naloxone HCl 4 MG/0.1ML liquid, Administer 4 mg into affected nostril(s), Disp: , Rfl:    oxyCODONE  (OxyCONTIN ) 10 MG 12 hr abuse-deterrent tablet, Take 10 mg by mouth every night, Disp: , Rfl:    oxyCODONE  (ROXICODONE ) 10 MG immediate release tablet, Take 10 mg by mouth, Disp: , Rfl:    pregabalin  (LYRICA ) 75 MG capsule, Take 75 mg by mouth, Disp: , Rfl:    primidone  (MYSOLINE ) 50 MG tablet, Take 12.5 mg by mouth, Disp: , Rfl:    rOPINIRole  (REQUIP ) 0.25 MG tablet, Take 0.25 mg by mouth, Disp: , Rfl:    senna (SENOKOT) 8.6 MG tablet, Take 1 tablet by mouth in the morning and 1 tablet in the evening., Disp: , Rfl:    sertraline  (ZOLOFT ) 50 MG tablet, Take 50 mg by mouth 1 (one) time each day, Disp: , Rfl:    Allergies Patient has no known allergies.  Problem List There is no problem list on file for this patient.    Review of Systems  Constitutional:  Negative for chills and fever.  Respiratory:  Negative for cough  and shortness of breath.   Cardiovascular:  Positive for leg swelling. Negative for chest pain and palpitations.  Gastrointestinal:  Negative for nausea and vomiting.  Genitourinary:  Negative for dysuria, hematuria and urgency.  Neurological:  Positive for weakness.     History Past Medical History:  Diagnosis Date   Cerebral artery occlusion, unspecified, with cerebral infarction (HCC)    Chronic kidney disease    Coronary atherosclerosis of unspecified type of vessel, native or graft     History reviewed. No pertinent surgical history. History reviewed. No pertinent family history. Social History   Tobacco Use   Smoking status: Not on file   Smokeless tobacco: Not on file  Substance Use Topics    Alcohol use: Not on file        Physical Exam  Vitals BP 118/70 (BP Location: Right upper arm, Patient Position: Sitting)   Pulse 93   SpO2 94%   PHYSICAL EXAM: General appearance: Ill-appearing, NAD Eyes: anicteric sclerae, moist conjunctivae; no lid-lag  HENT: Atraumatic; hearing intact Neck: Trachea midline; supple Lungs: CTAB, with normal respiratory effort  CV: S1S2, no murmurs or rubs. Abdomen: Soft, non-tender; bowel sounds present Extremities: 1+ lower extremity edema Skin: Warm and dry, normal skin turgor, no rashes noted. Psych: Appropriate affect, alert and oriented to person, place and time    Laboratory Studies  Chemistry  Lab Units 11/09/24 1100  SODIUM mmol/L 136  POTASSIUM mmol/L 5.2  CHLORIDE mmol/L 102  CO2 mmol/L 20  CALCIUM  mg/dL 9.3  PHOSPHORUS mg/dL 5.8*  PTH pg/mL 589*  GLUCOSE mg/dL 896*  ALBUMIN g/dL 4.2  BUN mg/dL 890*  CREATININE mg/dL 1.77*        No lab exists for component: IRON SATURATION, TRANSSATPER  CBC  Lab Units 11/09/24 1100  WBC AUTO Thousand/uL 5.2  HEMOGLOBIN g/dL 8.7*  HEMATOCRIT % 72.5*  MCV fL 84.3  PLATELETS AUTO Thousand/uL 202        No lab exists for component: GLUCOSEUR, BILIRUBINUR, SPECGRAV, RBCUR, LEUKOCYTESUR, NITRITE  Lab Results  Component Value Date   PTH 410 (H) 11/09/2024   CALCIUM  9.3 11/09/2024   PHOS 5.8 (H) 11/09/2024     Imaging and Other Studies     Orders Placed This Encounter   Hepatitis B Surface Antigen   Hepatitis B Surface Antibody   Hepatitis B core antibody, IgM   Quantiferon TB Gold Plus   CBC and Differential   Renal Function Panel   PTH, Intact   Ambulatory referral for Dialysis Initiation   Ambulatory referral to Vascular Surgery       Impression/Recommendations  [name] is a 63 y.o. male with past medical history of severe peripheral vascular disease, history of CVA, hypertension, diabetes mellitus type 2, BPH, history of  C7 cervical fracture, GERD, glaucoma, gout, hyperlipidemia, anemia of chronic disease, vitamin D  deficiency who returns today in follow-up of acute kidney injury in the setting of known chronic kidney disease stage IV.  1.  End-stage renal disease.  It appears that the patient has progressed to end-stage renal disease.  Creatinine up to 8.2 with eGFR down to 7.  He is manifesting many features of renal failure including weakness, fatigue, leg swelling, anemia of chronic kidney disease, and secondary hyperparathyroidism.  We talked about all treatment options including hospice versus renal placement therapy.  Ultimately patient decided upon proceeding with renal placement therapy.  We will urgently refer the patient over to [facility] for PermCath placement and hopefully he  can be evaluated for fistula placement in the relatively near future as well.  We will refer the patient over to [facility].  2.  Anemia of chronic disease.  Hemoglobin 8.7.  We will start the patient on Mircera with dialysis.  3.  Hypertension.  Blood pressure currently 118/70.  Patient be maintained on amlodipine  and hydralazine  for hypertension control.  4.  Secondary hyperparathyroidism.  Most recent PTH was 410, phosphorus 5.8, calcium  9.3.  Will start the patient on calcitriol with dialysis.  Return in about 4 weeks (around 12/21/2024).   Munsoor Lateef, MD

## 2024-11-24 ENCOUNTER — Inpatient Hospital Stay
Admission: EM | Admit: 2024-11-24 | Discharge: 2024-11-29 | DRG: 291 | Disposition: A | Discharge status: Skilled Nursing Facility | Source: Skilled Nursing Facility | Attending: Internal Medicine | Admitting: Internal Medicine

## 2024-11-24 ENCOUNTER — Telehealth (INDEPENDENT_AMBULATORY_CARE_PROVIDER_SITE_OTHER): Payer: Self-pay

## 2024-11-24 ENCOUNTER — Other Ambulatory Visit: Payer: Self-pay

## 2024-11-24 DIAGNOSIS — E1122 Type 2 diabetes mellitus with diabetic chronic kidney disease: Secondary | ICD-10-CM | POA: Diagnosis not present

## 2024-11-24 DIAGNOSIS — N186 End stage renal disease: Secondary | ICD-10-CM | POA: Diagnosis present

## 2024-11-24 DIAGNOSIS — K219 Gastro-esophageal reflux disease without esophagitis: Secondary | ICD-10-CM | POA: Diagnosis present

## 2024-11-24 DIAGNOSIS — G8324 Monoplegia of upper limb affecting left nondominant side: Secondary | ICD-10-CM | POA: Diagnosis present

## 2024-11-24 DIAGNOSIS — Z970 Presence of artificial eye: Secondary | ICD-10-CM | POA: Diagnosis not present

## 2024-11-24 DIAGNOSIS — Z79899 Other long term (current) drug therapy: Secondary | ICD-10-CM | POA: Diagnosis not present

## 2024-11-24 DIAGNOSIS — E875 Hyperkalemia: Principal | ICD-10-CM | POA: Diagnosis present

## 2024-11-24 DIAGNOSIS — M1990 Unspecified osteoarthritis, unspecified site: Secondary | ICD-10-CM | POA: Diagnosis not present

## 2024-11-24 DIAGNOSIS — Z7984 Long term (current) use of oral hypoglycemic drugs: Secondary | ICD-10-CM | POA: Diagnosis not present

## 2024-11-24 DIAGNOSIS — I5032 Chronic diastolic (congestive) heart failure: Secondary | ICD-10-CM | POA: Diagnosis not present

## 2024-11-24 DIAGNOSIS — Z7902 Long term (current) use of antithrombotics/antiplatelets: Secondary | ICD-10-CM | POA: Diagnosis not present

## 2024-11-24 DIAGNOSIS — Z8419 Family history of other disorders of kidney and ureter: Secondary | ICD-10-CM | POA: Diagnosis not present

## 2024-11-24 DIAGNOSIS — F17210 Nicotine dependence, cigarettes, uncomplicated: Secondary | ICD-10-CM | POA: Diagnosis present

## 2024-11-24 DIAGNOSIS — I132 Hypertensive heart and chronic kidney disease with heart failure and with stage 5 chronic kidney disease, or end stage renal disease: Principal | ICD-10-CM | POA: Diagnosis present

## 2024-11-24 DIAGNOSIS — Z992 Dependence on renal dialysis: Secondary | ICD-10-CM | POA: Diagnosis not present

## 2024-11-24 DIAGNOSIS — N2589 Other disorders resulting from impaired renal tubular function: Secondary | ICD-10-CM

## 2024-11-24 DIAGNOSIS — D631 Anemia in chronic kidney disease: Secondary | ICD-10-CM | POA: Diagnosis present

## 2024-11-24 DIAGNOSIS — E1151 Type 2 diabetes mellitus with diabetic peripheral angiopathy without gangrene: Secondary | ICD-10-CM | POA: Diagnosis present

## 2024-11-24 DIAGNOSIS — N179 Acute kidney failure, unspecified: Secondary | ICD-10-CM | POA: Diagnosis present

## 2024-11-24 DIAGNOSIS — E785 Hyperlipidemia, unspecified: Secondary | ICD-10-CM | POA: Diagnosis present

## 2024-11-24 DIAGNOSIS — E559 Vitamin D deficiency, unspecified: Secondary | ICD-10-CM | POA: Diagnosis not present

## 2024-11-24 DIAGNOSIS — Z7982 Long term (current) use of aspirin: Secondary | ICD-10-CM | POA: Diagnosis not present

## 2024-11-24 DIAGNOSIS — E8721 Acute metabolic acidosis: Secondary | ICD-10-CM | POA: Diagnosis present

## 2024-11-24 LAB — CBC WITH DIFFERENTIAL/PLATELET
Abs Immature Granulocytes: 0.01 K/uL (ref 0.00–0.07)
Basophils Absolute: 0 K/uL (ref 0.0–0.1)
Basophils Relative: 1 %
Eosinophils Absolute: 0.3 K/uL (ref 0.0–0.5)
Eosinophils Relative: 5 %
HCT: 26.3 % — ABNORMAL LOW (ref 39.0–52.0)
Hemoglobin: 8.4 g/dL — ABNORMAL LOW (ref 13.0–17.0)
Immature Granulocytes: 0 %
Lymphocytes Relative: 21 %
Lymphs Abs: 1.1 K/uL (ref 0.7–4.0)
MCH: 27.6 pg (ref 26.0–34.0)
MCHC: 31.9 g/dL (ref 30.0–36.0)
MCV: 86.5 fL (ref 80.0–100.0)
Monocytes Absolute: 0.9 K/uL (ref 0.1–1.0)
Monocytes Relative: 16 %
Neutro Abs: 3.2 K/uL (ref 1.7–7.7)
Neutrophils Relative %: 57 %
Platelets: 195 K/uL (ref 150–400)
RBC: 3.04 MIL/uL — ABNORMAL LOW (ref 4.22–5.81)
RDW: 15.6 % — ABNORMAL HIGH (ref 11.5–15.5)
WBC: 5.5 K/uL (ref 4.0–10.5)
nRBC: 0 % (ref 0.0–0.2)

## 2024-11-24 LAB — COMPREHENSIVE METABOLIC PANEL WITH GFR
ALT: 17 U/L (ref 0–44)
AST: 22 U/L (ref 15–41)
Albumin: 4.3 g/dL (ref 3.5–5.0)
Alkaline Phosphatase: 80 U/L (ref 38–126)
Anion gap: 18 — ABNORMAL HIGH (ref 5–15)
BUN: 115 mg/dL — ABNORMAL HIGH (ref 8–23)
CO2: 17 mmol/L — ABNORMAL LOW (ref 22–32)
Calcium: 9.6 mg/dL (ref 8.9–10.3)
Chloride: 104 mmol/L (ref 98–111)
Creatinine, Ser: 8.29 mg/dL — ABNORMAL HIGH (ref 0.61–1.24)
GFR, Estimated: 7 mL/min — ABNORMAL LOW
Glucose, Bld: 157 mg/dL — ABNORMAL HIGH (ref 70–99)
Potassium: 5.5 mmol/L — ABNORMAL HIGH (ref 3.5–5.1)
Sodium: 139 mmol/L (ref 135–145)
Total Bilirubin: 0.3 mg/dL (ref 0.0–1.2)
Total Protein: 7.4 g/dL (ref 6.5–8.1)

## 2024-11-24 LAB — HEMOGLOBIN A1C
Hgb A1c MFr Bld: 6 % — ABNORMAL HIGH (ref 4.8–5.6)
Mean Plasma Glucose: 125.5 mg/dL

## 2024-11-24 LAB — GLUCOSE, CAPILLARY
Glucose-Capillary: 80 mg/dL (ref 70–99)
Glucose-Capillary: 90 mg/dL (ref 70–99)

## 2024-11-24 MED ORDER — PREGABALIN 75 MG PO CAPS
75.0000 mg | ORAL_CAPSULE | Freq: Every day | ORAL | Status: DC
  Administered 2024-11-24 – 2024-11-28 (×5): 75 mg via ORAL
  Filled 2024-11-24 (×5): qty 1

## 2024-11-24 MED ORDER — HEPARIN SODIUM (PORCINE) 5000 UNIT/ML IJ SOLN
5000.0000 [IU] | Freq: Three times a day (TID) | INTRAMUSCULAR | Status: DC
  Administered 2024-11-24 – 2024-11-29 (×13): 5000 [IU] via SUBCUTANEOUS
  Filled 2024-11-24 (×13): qty 1

## 2024-11-24 MED ORDER — CLOPIDOGREL BISULFATE 75 MG PO TABS
75.0000 mg | ORAL_TABLET | Freq: Every day | ORAL | Status: DC
  Administered 2024-11-25 – 2024-11-29 (×5): 75 mg via ORAL
  Filled 2024-11-24 (×5): qty 1

## 2024-11-24 MED ORDER — CHLORHEXIDINE GLUCONATE CLOTH 2 % EX PADS
6.0000 | MEDICATED_PAD | Freq: Every day | CUTANEOUS | Status: DC
  Administered 2024-11-25 – 2024-11-26 (×2): 6 via TOPICAL

## 2024-11-24 MED ORDER — ATORVASTATIN CALCIUM 20 MG PO TABS
40.0000 mg | ORAL_TABLET | Freq: Every day | ORAL | Status: DC
  Administered 2024-11-24 – 2024-11-28 (×5): 40 mg via ORAL
  Filled 2024-11-24 (×5): qty 2

## 2024-11-24 MED ORDER — ROPINIROLE HCL 0.25 MG PO TABS
0.2500 mg | ORAL_TABLET | Freq: Every day | ORAL | Status: DC
  Administered 2024-11-24 – 2024-11-28 (×5): 0.25 mg via ORAL
  Filled 2024-11-24 (×7): qty 1

## 2024-11-24 MED ORDER — VITAMIN C 500 MG PO TABS
250.0000 mg | ORAL_TABLET | Freq: Two times a day (BID) | ORAL | Status: DC
  Administered 2024-11-24 – 2024-11-29 (×10): 250 mg via ORAL
  Filled 2024-11-24 (×10): qty 1

## 2024-11-24 MED ORDER — LISINOPRIL 10 MG PO TABS
10.0000 mg | ORAL_TABLET | Freq: Every day | ORAL | Status: DC
  Administered 2024-11-26 – 2024-11-28 (×3): 10 mg via ORAL
  Filled 2024-11-24 (×3): qty 1

## 2024-11-24 MED ORDER — AMLODIPINE BESYLATE 10 MG PO TABS
10.0000 mg | ORAL_TABLET | Freq: Every day | ORAL | Status: DC
  Administered 2024-11-26 – 2024-11-28 (×3): 10 mg via ORAL
  Filled 2024-11-24 (×3): qty 1

## 2024-11-24 MED ORDER — SODIUM ZIRCONIUM CYCLOSILICATE 10 G PO PACK
10.0000 g | PACK | Freq: Once | ORAL | Status: AC
  Administered 2024-11-24: 10 g via ORAL
  Filled 2024-11-24 (×2): qty 1

## 2024-11-24 MED ORDER — INSULIN ASPART 100 UNIT/ML IJ SOLN
0.0000 [IU] | Freq: Three times a day (TID) | INTRAMUSCULAR | Status: DC
  Administered 2024-11-27: 1 [IU] via SUBCUTANEOUS
  Administered 2024-11-27 – 2024-11-28 (×2): 2 [IU] via SUBCUTANEOUS
  Filled 2024-11-24: qty 1

## 2024-11-24 MED ORDER — HYDRALAZINE HCL 50 MG PO TABS
50.0000 mg | ORAL_TABLET | Freq: Three times a day (TID) | ORAL | Status: DC
  Administered 2024-11-24 – 2024-11-28 (×10): 50 mg via ORAL
  Filled 2024-11-24 (×10): qty 1

## 2024-11-24 MED ORDER — SERTRALINE HCL 50 MG PO TABS
50.0000 mg | ORAL_TABLET | Freq: Every day | ORAL | Status: DC
  Administered 2024-11-25 – 2024-11-29 (×5): 50 mg via ORAL
  Filled 2024-11-24 (×5): qty 1

## 2024-11-24 MED ORDER — VITAMIN D 25 MCG (1000 UNIT) PO TABS
2000.0000 [IU] | ORAL_TABLET | Freq: Every day | ORAL | Status: DC
  Administered 2024-11-25 – 2024-11-29 (×5): 2000 [IU] via ORAL
  Filled 2024-11-24 (×5): qty 2

## 2024-11-24 MED ORDER — FERROUS SULFATE 325 (65 FE) MG PO TABS
325.0000 mg | ORAL_TABLET | ORAL | Status: DC
  Administered 2024-11-26 – 2024-11-28 (×2): 325 mg via ORAL
  Filled 2024-11-24 (×2): qty 1

## 2024-11-24 MED ORDER — SENNOSIDES-DOCUSATE SODIUM 8.6-50 MG PO TABS
1.0000 | ORAL_TABLET | Freq: Two times a day (BID) | ORAL | Status: DC
  Administered 2024-11-24 – 2024-11-29 (×10): 1 via ORAL
  Filled 2024-11-24 (×10): qty 1

## 2024-11-24 MED ORDER — ASPIRIN 81 MG PO TBEC
81.0000 mg | DELAYED_RELEASE_TABLET | Freq: Every day | ORAL | Status: DC
  Administered 2024-11-25 – 2024-11-29 (×5): 81 mg via ORAL
  Filled 2024-11-24 (×5): qty 1

## 2024-11-24 MED ORDER — PENTAFLUOROPROP-TETRAFLUOROETH EX AERO
INHALATION_SPRAY | CUTANEOUS | Status: AC
  Filled 2024-11-24: qty 30

## 2024-11-24 MED ORDER — INSULIN ASPART 100 UNIT/ML IJ SOLN: 0.0000 [IU] | Freq: Every day | INTRAMUSCULAR | Status: DC

## 2024-11-24 MED ORDER — PANTOPRAZOLE SODIUM 40 MG PO TBEC
40.0000 mg | DELAYED_RELEASE_TABLET | Freq: Every day | ORAL | Status: DC
  Administered 2024-11-24 – 2024-11-29 (×6): 40 mg via ORAL
  Filled 2024-11-24 (×6): qty 1

## 2024-11-24 MED ORDER — OXYCODONE HCL 5 MG PO TABS
5.0000 mg | ORAL_TABLET | ORAL | Status: DC | PRN
  Administered 2024-11-28: 5 mg via ORAL
  Filled 2024-11-24: qty 1

## 2024-11-24 NOTE — Progress Notes (Signed)
" °  Received patient in bed to unit.   Informed consent signed and in chart.    TX duration: 1.5hrs     Transported back to floor  Hand-off given to patient's nurse. No c/o no acute distress noted    Access used: L AVF Access issues: none Cannulated for first time.  17g needles used with no issues    Total UF removed: 1.0L Medication(s) given: none Post HD VS: wnl    Olivia Hurst LPN [facility]   "

## 2024-11-24 NOTE — ED Notes (Signed)
 Report given to dialysis nurse.
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
## 2024-11-24 NOTE — Telephone Encounter (Signed)
 Spoke with [facility] and the patient is scheduled for a Permcath insertion on 11/29/24 with a 1:30 pm arrival time to the [facility]. Pre-procedure instructions will be faxed to attn Brandy at [facility]. It was offered to schedule him for 11/26/24, 11/30/24 and it was declined.

## 2024-11-24 NOTE — Progress Notes (Signed)
 [facility] [place], [place] 11/24/2024  Subjective:   LOS: 0  Patient referred from outpatient setting for initiation of dialysis because of elevated BUN.  His creatinine is 0.29, BUN 115, CO2 17.  Anemia with hemoglobin of 8.4.  No complaints of nausea or vomiting.  Ate breakfast without any problem today.  He has about 1+ lower extremity edema.    Objective:  Vital signs in last 24 hours:  Temp:  [98.2 F (36.8 C)] 98.2 F (36.8 C) (12/31 1307) Pulse Rate:  [90] 90 (12/31 1307) Resp:  [20] 20 (12/31 1307) BP: (170)/(69) 170/69 (12/31 1307) SpO2:  [95 %] 95 % (12/31 1307) Weight:  [83.5 kg] 83.5 kg (12/31 1316)  Weight change:  Filed Weights   11/24/24 1316  Weight: 83.5 kg    Intake/Output:   No intake or output data in the 24 hours ending 11/24/24 1545   Physical Exam: General: No acute distress  HEENT Moist oral mucous membrane  Pulm/lungs Normal breathing effort on room air  CVS/Heart Regular, no rub  Abdomen:  Soft, nontender, nondistended  Extremities: 1+ edema bilaterally  Neurologic: Alert, able to answer questions appropriately  Skin: No acute rashes  Access: Left upper extremity AV fistula       Basic Metabolic Panel:  Recent Labs  Lab 11/24/24 1318  NA 139  K 5.5*  CL 104  CO2 17*  GLUCOSE 157*  BUN 115*  CREATININE 8.29*  CALCIUM  9.6     CBC: Recent Labs  Lab 11/24/24 1318  WBC 5.5  NEUTROABS 3.2  HGB 8.4*  HCT 26.3*  MCV 86.5  PLT 195      Lab Results  Component Value Date   HEPBSAG NON REACTIVE 03/23/2024   HEPBSAB Reactive (A) 11/23/2021   HEPBIGM NON REACTIVE 11/21/2021      Microbiology:  No results found for this or any previous visit (from the past 240 hours).  Coagulation Studies: No results for input(s): LABPROT, INR in the last 72 hours.  Urinalysis: No results for input(s): COLORURINE, LABSPEC, PHURINE, GLUCOSEU, HGBUR, BILIRUBINUR, KETONESUR, PROTEINUR,  UROBILINOGEN, NITRITE, LEUKOCYTESUR in the last 72 hours.  Invalid input(s): APPERANCEUR    Imaging: No results found.   Medications:     [START ON 11/25/2024] Chlorhexidine  Gluconate Cloth  6 each Topical Q0600   heparin   5,000 Units Subcutaneous Q8H   sodium zirconium cyclosilicate   10 g Oral Once     Assessment/ Plan:  63 y.o. male with diabetes, hypertension, stroke with left upper extremity weakness, peripheral arterial disease, diabetic nephropathy/CKD, history of right femoral endarterectomy was admitted on 11/24/2024 for  Principal Problem:   ESRD on hemodialysis (HCC)  ESRD on hemodialysis (HCC) [N18.6, Z99.2]  #.  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  Due to no comorbidities, it is safer to initiate hemodialysis in the inpatient setting. He has a well-developed left upper extremity AV fistula which will be used. Orders placed. Next hemodialysis treatment tomorrow. Initiate outpatient placement Case discussed with patient and his brother.  #. Anemia of CKD  Lab Results  Component Value Date   HGB 8.4 (L) 11/24/2024   Low dose EPO with HD starting tomorrow  #. Secondary hyperparathyroidism of renal origin N 25.81      Component Value Date/Time   PTH 146 (H) 11/21/2021 1610   Lab Results  Component Value Date   PHOS 4.2 03/29/2024   Monitor calcium  and phos level during this admission   #.  Diabetes type 2 with CKD Hgb A1c MFr Bld (%)  Date Value  03/23/2024 7.0 (H)     LOS: 0 Suraiya Dickerson Dennise 12/31/20253:45 PM  [facility] [place], [place] [phone]

## 2024-11-24 NOTE — ED Notes (Signed)
 Pt to dialysis.

## 2024-11-24 NOTE — ED Provider Notes (Signed)
 "  [facility] Provider Note    Event Date/Time   First MD Initiated Contact with Patient 11/24/24 1354     (approximate)   History   abnormal labs   HPI  [name] is a 63 y.o. male patient presents to the emergency department for dialysis.  Has a history of ESRD but has not yet started dialysis.  Sent in today for worsening lab work and kidney function to start dialysis.  Patient's only complaint is he feels very cold.  Denies any chest pain or shortness of breath.     Physical Exam   Triage Vital Signs: ED Triage Vitals  Encounter Vitals Group     BP 11/24/24 1307 (!) 170/69     Girls Systolic BP Percentile --      Girls Diastolic BP Percentile --      Boys Systolic BP Percentile --      Boys Diastolic BP Percentile --      Pulse Rate 11/24/24 1307 90     Resp 11/24/24 1307 20     Temp 11/24/24 1307 98.2 F (36.8 C)     Temp Source 11/24/24 1307 Oral     SpO2 11/24/24 1307 95 %     Weight 11/24/24 1316 184 lb (83.5 kg)     Height 11/24/24 1316 6' 1 (1.854 m)     Head Circumference --      [facility] Flow --      Pain Score 11/24/24 1316 0     Pain Loc --      Pain Education --      Exclude from Growth Chart --     Most recent vital signs: Vitals:   11/24/24 1307  BP: (!) 170/69  Pulse: 90  Resp: 20  Temp: 98.2 F (36.8 C)  SpO2: 95%    Physical Exam Constitutional:      Appearance: He is well-developed.  HENT:     Head: Atraumatic.  Eyes:     Comments: Right eye with chronic blindness  Cardiovascular:     Rate and Rhythm: Regular rhythm.  Pulmonary:     Effort: No respiratory distress.  Abdominal:     Tenderness: There is no abdominal tenderness.  Musculoskeletal:        General: Normal range of motion.     Cervical back: Normal range of motion.  Skin:    General: Skin is warm.     Comments: Left AV fistula with palpable thrill  Neurological:     Mental Status: He is alert. Mental status is at baseline.      Comments: Contractures to upper extremities     IMPRESSION / MDM / ASSESSMENT AND PLAN / ED COURSE  I reviewed the triage vital signs and the nursing notes.  Differential diagnosis including electrolyte abnormality, acute kidney injury, pulmonary edema metabolic encephalopathy   EKG  I, Clotilda Punter, the attending physician, personally viewed and interpreted this ECG. Significant artifact but no peaked T waves or findings consistent with hyperkalemia  No tachycardic or bradycardic dysrhythmias while on cardiac telemetry.   LABS (all labs ordered are listed, but only abnormal results are displayed) Labs interpreted as -    Labs Reviewed  CBC WITH DIFFERENTIAL/PLATELET - Abnormal; Notable for the following components:      Result Value   RBC 3.04 (*)    Hemoglobin 8.4 (*)    HCT 26.3 (*)    RDW 15.6 (*)    All other components  within normal limits  COMPREHENSIVE METABOLIC PANEL WITH GFR - Abnormal; Notable for the following components:   Potassium 5.5 (*)    CO2 17 (*)    Glucose, Bld 157 (*)    BUN 115 (*)    Creatinine, Ser 8.29 (*)    GFR, Estimated 7 (*)    Anion gap 18 (*)    All other components within normal limits  HEPATITIS B SURFACE ANTIGEN  HEPATITIS B SURFACE ANTIBODY, QUANTITATIVE  HEPATITIS B CORE ANTIBODY, TOTAL     MDM    Lab work with hyperkalemia 5.5, no EKG changes and given Lokelma .  CO2 is low at 17 and significantly elevated BUN at 115.  Creatinine today is 8.2 from a baseline of 6.  Does have a mildly elevated anion gap likely in setting of BUN, normal glucose of 157.  Consulted and discussed the patient's case with nephrology with plans to do dialysis.  Consulted hospitalist for admission.   PROCEDURES:  Critical Care performed: yes  .Critical Care  Performed by: Suzanne Kirsch, MD Authorized by: Suzanne Kirsch, MD   Critical care provider statement:    Critical care time (minutes):  30   Critical care time was exclusive of:   Separately billable procedures and treating other patients   Critical care was necessary to treat or prevent imminent or life-threatening deterioration of the following conditions:  Metabolic crisis and renal failure   Critical care was time spent personally by me on the following activities:  Development of treatment plan with patient or surrogate, discussions with consultants, evaluation of patient's response to treatment, examination of patient, ordering and review of laboratory studies, ordering and review of radiographic studies, ordering and performing treatments and interventions, pulse oximetry, re-evaluation of patient's condition and review of old charts   Patient's presentation is most consistent with acute presentation with potential threat to life or bodily function.   MEDICATIONS ORDERED IN ED: Medications  sodium zirconium cyclosilicate  (LOKELMA ) packet 10 g (has no administration in time range)  Chlorhexidine  Gluconate Cloth 2 % PADS 6 each (has no administration in time range)  heparin  injection 5,000 Units (has no administration in time range)    FINAL CLINICAL IMPRESSION(S) / ED DIAGNOSES   Final diagnoses:  Hyperkalemia  Uremic acidosis  AKI (acute kidney injury)     Rx / DC Orders   ED Discharge Orders     None        Note:  This document was prepared using Dragon voice recognition software and may include unintentional dictation errors.   Suzanne Kirsch, MD 11/24/24 1600  "

## 2024-11-24 NOTE — ED Triage Notes (Addendum)
 Pt presents to the ED via [facility] from [facility]. Pt was sent here for abnormal labs and dialysis port placement. Pt has a appointment on 11/29/24 for a dialysis port placement. Pt A&Ox4. See first nurse note. Pt denies pain or SHOB.

## 2024-11-24 NOTE — ED Triage Notes (Signed)
 FIRST NURSE NOTE: Pt BIB [facility] from [facility] for abnormal labs and needing dialysis.   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  148/78 95 RA 85 18

## 2024-11-25 DIAGNOSIS — E8721 Acute metabolic acidosis: Secondary | ICD-10-CM | POA: Diagnosis not present

## 2024-11-25 DIAGNOSIS — Z992 Dependence on renal dialysis: Secondary | ICD-10-CM | POA: Diagnosis not present

## 2024-11-25 DIAGNOSIS — Z7902 Long term (current) use of antithrombotics/antiplatelets: Secondary | ICD-10-CM | POA: Diagnosis not present

## 2024-11-25 DIAGNOSIS — K219 Gastro-esophageal reflux disease without esophagitis: Secondary | ICD-10-CM | POA: Diagnosis not present

## 2024-11-25 DIAGNOSIS — E875 Hyperkalemia: Secondary | ICD-10-CM | POA: Diagnosis present

## 2024-11-25 DIAGNOSIS — F17210 Nicotine dependence, cigarettes, uncomplicated: Secondary | ICD-10-CM | POA: Diagnosis not present

## 2024-11-25 DIAGNOSIS — M1990 Unspecified osteoarthritis, unspecified site: Secondary | ICD-10-CM | POA: Diagnosis not present

## 2024-11-25 DIAGNOSIS — D631 Anemia in chronic kidney disease: Secondary | ICD-10-CM | POA: Diagnosis not present

## 2024-11-25 DIAGNOSIS — N186 End stage renal disease: Secondary | ICD-10-CM | POA: Diagnosis not present

## 2024-11-25 DIAGNOSIS — Z7982 Long term (current) use of aspirin: Secondary | ICD-10-CM | POA: Diagnosis not present

## 2024-11-25 DIAGNOSIS — N179 Acute kidney failure, unspecified: Secondary | ICD-10-CM | POA: Diagnosis not present

## 2024-11-25 DIAGNOSIS — Z7984 Long term (current) use of oral hypoglycemic drugs: Secondary | ICD-10-CM | POA: Diagnosis not present

## 2024-11-25 DIAGNOSIS — G8324 Monoplegia of upper limb affecting left nondominant side: Secondary | ICD-10-CM | POA: Diagnosis not present

## 2024-11-25 DIAGNOSIS — E1122 Type 2 diabetes mellitus with diabetic chronic kidney disease: Secondary | ICD-10-CM | POA: Diagnosis not present

## 2024-11-25 DIAGNOSIS — E559 Vitamin D deficiency, unspecified: Secondary | ICD-10-CM | POA: Diagnosis not present

## 2024-11-25 DIAGNOSIS — E1151 Type 2 diabetes mellitus with diabetic peripheral angiopathy without gangrene: Secondary | ICD-10-CM | POA: Diagnosis not present

## 2024-11-25 DIAGNOSIS — Z79899 Other long term (current) drug therapy: Secondary | ICD-10-CM | POA: Diagnosis not present

## 2024-11-25 DIAGNOSIS — Z8419 Family history of other disorders of kidney and ureter: Secondary | ICD-10-CM | POA: Diagnosis not present

## 2024-11-25 DIAGNOSIS — I5032 Chronic diastolic (congestive) heart failure: Secondary | ICD-10-CM | POA: Diagnosis not present

## 2024-11-25 DIAGNOSIS — E785 Hyperlipidemia, unspecified: Secondary | ICD-10-CM | POA: Diagnosis not present

## 2024-11-25 DIAGNOSIS — I132 Hypertensive heart and chronic kidney disease with heart failure and with stage 5 chronic kidney disease, or end stage renal disease: Secondary | ICD-10-CM | POA: Diagnosis not present

## 2024-11-25 DIAGNOSIS — Z970 Presence of artificial eye: Secondary | ICD-10-CM | POA: Diagnosis not present

## 2024-11-25 LAB — CBC
HCT: 25.1 % — ABNORMAL LOW (ref 39.0–52.0)
Hemoglobin: 8 g/dL — ABNORMAL LOW (ref 13.0–17.0)
MCH: 27.1 pg (ref 26.0–34.0)
MCHC: 31.9 g/dL (ref 30.0–36.0)
MCV: 85.1 fL (ref 80.0–100.0)
Platelets: 191 K/uL (ref 150–400)
RBC: 2.95 MIL/uL — ABNORMAL LOW (ref 4.22–5.81)
RDW: 15.5 % (ref 11.5–15.5)
WBC: 4.2 K/uL (ref 4.0–10.5)
nRBC: 0 % (ref 0.0–0.2)

## 2024-11-25 LAB — BASIC METABOLIC PANEL WITH GFR
Anion gap: 15 (ref 5–15)
BUN: 89 mg/dL — ABNORMAL HIGH (ref 8–23)
CO2: 22 mmol/L (ref 22–32)
Calcium: 9.1 mg/dL (ref 8.9–10.3)
Chloride: 103 mmol/L (ref 98–111)
Creatinine, Ser: 6.59 mg/dL — ABNORMAL HIGH (ref 0.61–1.24)
GFR, Estimated: 9 mL/min — ABNORMAL LOW
Glucose, Bld: 85 mg/dL (ref 70–99)
Potassium: 5.4 mmol/L — ABNORMAL HIGH (ref 3.5–5.1)
Sodium: 140 mmol/L (ref 135–145)

## 2024-11-25 LAB — GLUCOSE, CAPILLARY
Glucose-Capillary: 87 mg/dL (ref 70–99)
Glucose-Capillary: 85 mg/dL (ref 70–99)

## 2024-11-25 LAB — HEPATITIS B SURFACE ANTIGEN: Hepatitis B Surface Ag: NONREACTIVE

## 2024-11-25 MED ORDER — ACETAMINOPHEN 325 MG PO TABS
650.0000 mg | ORAL_TABLET | Freq: Four times a day (QID) | ORAL | Status: DC | PRN
  Administered 2024-11-25 – 2024-11-26 (×3): 650 mg via ORAL
  Filled 2024-11-25 (×3): qty 2

## 2024-11-25 MED ORDER — EPOETIN ALFA-EPBX 10000 UNIT/ML IJ SOLN
4000.0000 [IU] | INTRAMUSCULAR | Status: DC
  Administered 2024-11-26: 4000 [IU] via INTRAVENOUS
  Filled 2024-11-25: qty 1

## 2024-11-25 NOTE — Plan of Care (Signed)
" °  Problem: Clinical Measurements: Goal: Ability to maintain clinical measurements within normal limits will improve Outcome: Progressing Goal: Will remain free from infection Outcome: Progressing Goal: Respiratory complications will improve Outcome: Progressing Goal: Cardiovascular complication will be avoided Outcome: Progressing   Problem: Nutrition: Goal: Adequate nutrition will be maintained Outcome: Progressing   Problem: Coping: Goal: Level of anxiety will decrease Outcome: Progressing   Problem: Pain Managment: Goal: General experience of comfort will improve and/or be controlled Outcome: Progressing   Problem: Safety: Goal: Ability to remain free from injury will improve Outcome: Progressing   Problem: Skin Integrity: Goal: Risk for impaired skin integrity will decrease Outcome: Progressing   Problem: Education: Goal: Ability to describe self-care measures that may prevent or decrease complications (Diabetes Survival Skills Education) will improve Outcome: Progressing Goal: Individualized Educational Video(s) Outcome: Progressing   "

## 2024-11-25 NOTE — Progress Notes (Signed)
 PT Cancellation Note  Patient Details Name: [name] MRN: [id number] DOB: 1961/05/11   Cancelled Treatment:    Reason Eval/Treat Not Completed: Patient at procedure or test/unavailable Pt OTF at dialysis at this time. Will f/u as able.  [name], PT, DPT 11/25/2024, 9:37 AM   [name] 11/25/2024, 9:37 AM

## 2024-11-25 NOTE — Plan of Care (Signed)
   Problem: Clinical Measurements: Goal: Respiratory complications will improve Outcome: Progressing   Problem: Activity: Goal: Risk for activity intolerance will decrease Outcome: Progressing   Problem: Coping: Goal: Level of anxiety will decrease Outcome: Progressing

## 2024-11-25 NOTE — Progress Notes (Signed)
 [facility], [place] 11/25/2024  Subjective:   LOS: 0  Patient seen and evaluated during dialysis   HEMODIALYSIS FLOWSHEET:  Blood Flow Rate (mL/min): 199 mL/min Arterial Pressure (mmHg): -100.4 mmHg Venous Pressure (mmHg): 154.74 mmHg TMP (mmHg): 27.07 mmHg Ultrafiltration Rate (mL/min): 0 mL/min Dialysate Flow Rate (mL/min): 300 ml/min  No complaints during second treatment.    Objective:  Vital signs in last 24 hours:  Temp:  [97.8 F (36.6 C)-98.5 F (36.9 C)] 98.5 F (36.9 C) (01/01 1024) Pulse Rate:  [78-100] 93 (01/01 1024) Resp:  [10-20] 14 (01/01 0950) BP: (79-170)/(54-137) 128/60 (01/01 1024) SpO2:  [95 %-100 %] 97 % (01/01 0950) Weight:  [79.4 kg-83.7 kg] 79.4 kg (01/01 0950)  Weight change:  Filed Weights   11/25/24 0447 11/25/24 0735 11/25/24 0950  Weight: 83.7 kg 79.6 kg 79.4 kg    Intake/Output:    Intake/Output Summary (Last 24 hours) at 11/25/2024 1217 Last data filed at 11/25/2024 0950 Gross per 24 hour  Intake 420 ml  Output 1500 ml  Net -1080 ml     Physical Exam: General: No acute distress  HEENT Moist oral mucous membrane  Pulm/lungs Normal breathing effort on room air  CVS/Heart Regular, no rub  Abdomen:  Soft, nontender, nondistended  Extremities: 1+ edema bilaterally  Neurologic: Alert, able to answer questions appropriately  Skin: No acute rashes  Access: Left upper extremity AV fistula       Basic Metabolic Panel:  Recent Labs  Lab 11/24/24 1318 11/25/24 0445  NA 139 140  K 5.5* 5.4*  CL 104 103  CO2 17* 22  GLUCOSE 157* 85  BUN 115* 89*  CREATININE 8.29* 6.59*  CALCIUM  9.6 9.1     CBC: Recent Labs  Lab 11/24/24 1318 11/25/24 0445  WBC 5.5 4.2  NEUTROABS 3.2  --   HGB 8.4* 8.0*  HCT 26.3* 25.1*  MCV 86.5 85.1  PLT 195 191      Lab Results  Component Value Date   HEPBSAG NON REACTIVE 11/24/2024   HEPBSAB Reactive (A) 11/23/2021   HEPBIGM NON REACTIVE 11/21/2021       Microbiology:  No results found for this or any previous visit (from the past 240 hours).  Coagulation Studies: No results for input(s): LABPROT, INR in the last 72 hours.  Urinalysis: No results for input(s): COLORURINE, LABSPEC, PHURINE, GLUCOSEU, HGBUR, BILIRUBINUR, KETONESUR, PROTEINUR, UROBILINOGEN, NITRITE, LEUKOCYTESUR in the last 72 hours.  Invalid input(s): APPERANCEUR    Imaging: No results found.   Medications:     amLODipine   10 mg Oral Daily   ascorbic acid   250 mg Oral BID   aspirin  EC  81 mg Oral Daily   atorvastatin   40 mg Oral QHS   Chlorhexidine  Gluconate Cloth  6 each Topical Q0600   cholecalciferol   2,000 Units Oral Daily   clopidogrel   75 mg Oral Q0600   [START ON 11/26/2024] ferrous sulfate   325 mg Oral QODAY   heparin   5,000 Units Subcutaneous Q8H   hydrALAZINE   50 mg Oral TID   insulin  aspart  0-5 Units Subcutaneous QHS   insulin  aspart  0-9 Units Subcutaneous TID WC   lisinopril  10 mg Oral Daily   pantoprazole  40 mg Oral Daily   pregabalin   75 mg Oral QHS   rOPINIRole   0.25 mg Oral QHS   senna-docusate  1 tablet Oral BID   sertraline   50 mg Oral Daily   acetaminophen , oxyCODONE   Assessment/ Plan:  64  y.o. male with diabetes, hypertension, stroke with left upper extremity weakness, peripheral arterial disease, diabetic nephropathy/CKD, history of right femoral endarterectomy was admitted on 11/24/2024 for  Principal Problem:   ESRD on hemodialysis (HCC)  Hyperkalemia [E87.5] Uremic acidosis [N25.89] AKI (acute kidney injury) [N17.9] ESRD on hemodialysis (HCC) [N18.6, Z99.2]  #.  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  Due to no comorbidities, it is safer to initiate hemodialysis in the inpatient setting. He has a well-developed left upper extremity AV fistula which will be used. Fistula access for treatment, no complications Received first  treatment yesterday with second treatment today. Will plan for third treatment tomorrow. Dialysis navigator will seek outpatient clinic placement in am. Potassium and BUN improving.   #. Anemia of CKD  Lab Results  Component Value Date   HGB 8.0 (L) 11/25/2024   Low dose EPO with HD.  #. Secondary hyperparathyroidism of renal origin N 25.81      Component Value Date/Time   PTH 146 (H) 11/21/2021 1610   Lab Results  Component Value Date   PHOS 4.2 03/29/2024   Calcium  acceptable. Will order phos add on. Continue cholecalciferol     #. Diabetes type 2 with CKD Hgb A1c MFr Bld (%)  Date Value  11/24/2024 6.0 (H)   Glucose well controlled.    LOS: 0 Faith Harris 1/1/202612:17 PM  [facility] [address] [place], [place] [phone]

## 2024-11-25 NOTE — Evaluation (Signed)
 Occupational Therapy Evaluation Patient Details Name: [name] MRN: [id number] DOB: [birth year] Today's Date: 11/25/2024   History of Present Illness   64 y.o. male with medical history significant of ESRD on hemodialysis, GERD, congestive heart failure, diabetes mellitus type 2, glaucoma, hyperlipidemia, hypertension, osteoarthritis, vitamin D  deficiency, patient was otherwise well until his lab results today showed abnormal renal function as well as hyperkalemia and therefore brought into the emergency room for further management.     Clinical Impressions Patient presenting with decreased Ind in self care,balance, functional mobility, transfers, endurance, and safety awareness.  Patient reports being at [facility] LTC and being able to stand pivot transfer to wheelchair and toilet. Staff assist with self care tasks. OT called [facility] to confirm. Pt cried out in pain when body is touched this session. Total A needed to reposition pt in bed. He does demonstrate bringing R UE to mouth because he does report being able to self feed. Food tray is available but pt declines eating during this session. Patient does not appear to be at baseline. Patient will benefit from acute OT to increase overall independence in the areas of ADLs, functional mobility, and safety awareness in order to safely discharge.      Functional Status Assessment   Patient has had a recent decline in their functional status and demonstrates the ability to make significant improvements in function in a reasonable and predictable amount of time.     Equipment Recommendations   None recommended by OT      Precautions/Restrictions   Precautions Precautions: Fall     Mobility Bed Mobility Overal bed mobility: Needs Assistance Bed Mobility: Rolling Rolling: Total assist              Transfers                              ADL either performed or assessed with clinical judgement   ADL  Overall ADL's : Needs assistance/impaired                                       General ADL Comments: total A     Vision Baseline Vision/History: 2 Legally blind              Pertinent Vitals/Pain Pain Assessment Pain Assessment: Faces Faces Pain Scale: Hurts whole lot Pain Location: B feet Pain Descriptors / Indicators: Aching, Discomfort, Grimacing, Guarding Pain Intervention(s): Monitored during session, Repositioned     Extremity/Trunk Assessment Upper Extremity Assessment Upper Extremity Assessment: Generalized weakness;Right hand dominant;LUE deficits/detail LUE Deficits / Details: contracture from prior CVA           Communication Communication Communication: Impaired Factors Affecting Communication: Difficulty expressing self;Reduced clarity of speech   Cognition Arousal: Alert Behavior During Therapy: Flat affect Cognition: Difficult to assess Difficult to assess due to: Impaired communication                             Following commands: Intact       Cueing  General Comments   Cueing Techniques: Verbal cues;Gestural cues              Home Living Family/patient expects to be discharged to:: Skilled nursing facility  Additional Comments: LTC at [facility]      Prior Functioning/Environment Prior Level of Function : Needs assist             Mobility Comments: SPT to from w/c at baseline with assistance from staff ADLs Comments: Assist for ADL from staff and set up of meal for pt to feed self with R hand. OT called staff at [facility] to confirm and they report he also transfers to commode for toileting.    OT Problem List: Decreased strength;Decreased safety awareness;Decreased activity tolerance;Impaired balance (sitting and/or standing)   OT Treatment/Interventions: Self-care/ADL training;Therapeutic activities;Therapeutic exercise;Energy conservation;Patient/family  education;Balance training      OT Goals(Current goals can be found in the care plan section)   Acute Rehab OT Goals Patient Stated Goal: to rest OT Goal Formulation: With patient Time For Goal Achievement: 12/09/24 Potential to Achieve Goals: Fair ADL Goals Pt Will Perform Lower Body Dressing: with mod assist Pt Will Transfer to Toilet: with mod assist Pt Will Perform Toileting - Clothing Manipulation and hygiene: with mod assist   OT Frequency:  Min 2X/week       AM-PAC OT 6 Clicks Daily Activity     Outcome Measure Help from another person eating meals?: None Help from another person taking care of personal grooming?: A Little Help from another person toileting, which includes using toliet, bedpan, or urinal?: Total Help from another person bathing (including washing, rinsing, drying)?: Total Help from another person to put on and taking off regular upper body clothing?: Total Help from another person to put on and taking off regular lower body clothing?: Total 6 Click Score: 11   End of Session Nurse Communication: Mobility status  Activity Tolerance: Patient limited by pain Patient left: in bed;with call bell/phone within reach;with bed alarm set  OT Visit Diagnosis: Unsteadiness on feet (R26.81);Repeated falls (R29.6);Muscle weakness (generalized) (M62.81)                Time: 8572-8554 OT Time Calculation (min): 18 min Charges:  OT General Charges $OT Visit: 1 Visit OT Evaluation $OT Eval Moderate Complexity: 1 [address], [place], OTR/L , [facility]IS ascom [phone]  11/25/2024, 3:14 PM

## 2024-11-25 NOTE — Progress Notes (Signed)
 " Progress Note   Patient: [name] FMW:[id number] DOB: 03-Mar-1961 DOA: 11/24/2024     0 DOS: the patient was seen and examined on 11/25/2024   Brief hospital course: From HPI [name] is a 64 y.o. male with medical history significant of ESRD on hemodialysis, GERD, congestive heart failure, diabetes mellitus type 2, glaucoma, hyperlipidemia, hypertension, osteoarthritis, vitamin D  deficiency, patient was otherwise well until his lab results today showed abnormal renal function as well as hyperkalemia and therefore brought into the emergency room for further management.  I did tell patient will be seen he denied any acute complaints except feeling cold.     ED course: Upon arrival to the emergency room patient was found to have temperature 98.2, respiratory rate 20 pulse 90, blood pressure 175/69 saturating 95% on room air.  Lab results shows potassium 5.5, ED physician discussed with nephrologist who has agreed to initiate hemodialysis   Assessment and Plan:   ESRD on hemodialysis Acute metabolic acidosis Continue hemodialysis Monitor renal function closely Nephrologist on board and planning another hemodialysis tomorrow   Normocytic anemia likely secondary to anemia of chronic kidney disease Monitor CBC   GERD Continue Protonix   Chronic diastolic congestive heart failure Monitor input and output Daily weighing Continue lisinopril   Type 2 diabetes mellitus Continue insulin  therapy Monitor glucose   Hyperlipidemia-continue statin therapy   Essential hypertension Continue home medications   Osteoarthritis Continue as needed pain medication   vitamin D  deficiency,  Continue oral supplementation   DVT prophylaxis-on heparin       Advance Care Planning:   Code Status: Full Code full code   Consults: Nephrology   Family Communication: None present at bedside    Subjective:  Seen and examined at bedside this morning Denies nausea vomiting abdominal  pain or chest pain  Physical Exam: General exam: Elderly male laying in bed in no acute distress Respiratory system: Decreased air entry bibasilarly Cardiovascular system: S1 & S2+.  Gastrointestinal system: abd is soft, NT, ND & hypoactive bowel sounds Central nervous system: alert & oriented. Moves all extremities  Musculoskeletal: [Bilateral lower extremity pitting edema Psychiatry: judgement and insight appears at baseline. Flat mood and affect    Data Reviewed:    Latest Ref Rng & Units 11/25/2024    4:45 AM 11/24/2024    1:18 PM 03/29/2024    4:44 AM  CBC  WBC 4.0 - 10.5 K/uL 4.2  5.5  9.0   Hemoglobin 13.0 - 17.0 g/dL 8.0  8.4  8.1   Hematocrit 39.0 - 52.0 % 25.1  26.3  25.1   Platelets 150 - 400 K/uL 191  195  213        Latest Ref Rng & Units 11/25/2024    4:45 AM 11/24/2024    1:18 PM 07/21/2024   11:08 AM  BMP  Glucose 70 - 99 mg/dL 85  842  93   BUN 8 - 23 mg/dL 89  884  79   Creatinine 0.61 - 1.24 mg/dL 3.40  1.70  4.00   BUN/Creat Ratio 10 - 24   13   Sodium 135 - 145 mmol/L 140  139  143   Potassium 3.5 - 5.1 mmol/L 5.4  5.5  4.4   Chloride 98 - 111 mmol/L 103  104  105   CO2 22 - 32 mmol/L 22  17    Calcium  8.9 - 10.3 mg/dL 9.1  9.6  9.1     Vitals:   11/25/24  0930 11/25/24 0950 11/25/24 1024 11/25/24 1305  BP: (!) 79/57 107/64 128/60 118/73  Pulse: 94 91 93 84  Resp: 19 14    Temp:  98.2 F (36.8 C) 98.5 F (36.9 C)   TempSrc:  Oral Oral   SpO2: 96% 97%    Weight:  79.4 kg    Height:         Author: Drue ONEIDA Potter, MD 11/25/2024 1:43 PM  For on call review [email or web address].  "

## 2024-11-25 NOTE — Progress Notes (Signed)
 Hemodialysis Note:  Received patient in bed to unit. Alert and oriented. Informed consent singed and in chart.  Treatment initiated: 0745 Treatment completed: 0950  Access used: Left AVF Access issues: None  UF goal not met due to Hypotension episodes.Transported back to room, alert without acute distress. Report given to patient's RN.  Total UF removed: 200 ml Medications given: None  Post HD weight: 79.4 Kg  [facility]

## 2024-11-26 ENCOUNTER — Inpatient Hospital Stay

## 2024-11-26 DIAGNOSIS — E875 Hyperkalemia: Secondary | ICD-10-CM | POA: Diagnosis not present

## 2024-11-26 DIAGNOSIS — N186 End stage renal disease: Secondary | ICD-10-CM | POA: Diagnosis not present

## 2024-11-26 DIAGNOSIS — I132 Hypertensive heart and chronic kidney disease with heart failure and with stage 5 chronic kidney disease, or end stage renal disease: Secondary | ICD-10-CM | POA: Diagnosis not present

## 2024-11-26 DIAGNOSIS — Z992 Dependence on renal dialysis: Secondary | ICD-10-CM | POA: Diagnosis not present

## 2024-11-26 LAB — HEPATITIS B SURFACE ANTIBODY, QUANTITATIVE: Hep B S AB Quant (Post): 337 m[IU]/mL

## 2024-11-26 LAB — GLUCOSE, CAPILLARY
Glucose-Capillary: 88 mg/dL (ref 70–99)
Glucose-Capillary: 109 mg/dL — ABNORMAL HIGH (ref 70–99)
Glucose-Capillary: 80 mg/dL (ref 70–99)
Glucose-Capillary: 118 mg/dL — ABNORMAL HIGH (ref 70–99)

## 2024-11-26 LAB — BASIC METABOLIC PANEL WITH GFR
Anion gap: 16 — ABNORMAL HIGH (ref 5–15)
BUN: 68 mg/dL — ABNORMAL HIGH (ref 8–23)
CO2: 24 mmol/L (ref 22–32)
Calcium: 9 mg/dL (ref 8.9–10.3)
Chloride: 98 mmol/L (ref 98–111)
Creatinine, Ser: 6.04 mg/dL — ABNORMAL HIGH (ref 0.61–1.24)
GFR, Estimated: 10 mL/min — ABNORMAL LOW
Glucose, Bld: 86 mg/dL (ref 70–99)
Potassium: 4.6 mmol/L (ref 3.5–5.1)
Sodium: 137 mmol/L (ref 135–145)

## 2024-11-26 LAB — HEPATITIS B CORE ANTIBODY, TOTAL: HEP B CORE AB: POSITIVE — AB

## 2024-11-26 LAB — PHOSPHORUS: Phosphorus: 4.9 mg/dL — ABNORMAL HIGH (ref 2.5–4.6)

## 2024-11-26 NOTE — Progress Notes (Incomplete)
 SABRA

## 2024-11-26 NOTE — Evaluation (Signed)
 Physical Therapy Evaluation Patient Details Name: [name] MRN: [id number] DOB: 1961/06/13 Today's Date: 11/26/2024  History of Present Illness  64 y.o. male with medical history significant of ESRD on hemodialysis, GERD, congestive heart failure, diabetes mellitus type 2, glaucoma, hyperlipidemia, hypertension, osteoarthritis, vitamin D  deficiency, patient was otherwise well until his lab results today showed abnormal renal function as well as hyperkalemia and therefore brought into the emergency room for further management.  Clinical Impression  Patient admitted with the above. PTA, patient is at [facility] and requires assistance from staff for stand pivot to/from w/c and assistance for ADLs. Patient presents with generalized weakness (baseline L sided weakness from prior CVA), impaired balance, and decreased activity tolerance. Complaining of B foot pain throughout session. Required totalA for bed mobility but unable to complete sitting EOB due to patient refusing to assist. Returned to supine with all needs met and X-ray tech at bedside. Patient will benefit from skilled PT services during acute stay to address listed deficits. Patient will benefit from ongoing therapy at discharge to maximize functional independence and safety.         If plan is discharge home, recommend the following: Two people to help with walking and/or transfers;A lot of help with bathing/dressing/bathroom;Assistance with cooking/housework   Can travel by private vehicle   No    Equipment Recommendations None recommended by PT  Recommendations for Other Services       Functional Status Assessment Patient has had a recent decline in their functional status and/or demonstrates limited ability to make significant improvements in function in a reasonable and predictable amount of time     Precautions / Restrictions Precautions Precautions: Fall Recall of Precautions/Restrictions: Intact Restrictions Weight  Bearing Restrictions Per Provider Order: No      Mobility  Bed Mobility Overal bed mobility: Needs Assistance Bed Mobility: Supine to Sit, Sit to Supine     Supine to sit: Total assist Sit to supine: Total assist   General bed mobility comments: attempted EOB, patient assisting <10% of movement and halfway towards EOB, refuses to complete any further mobility. Complaining of B foot pain    Transfers                        Ambulation/Gait                  Stairs            Wheelchair Mobility     Tilt Bed    Modified Rankin (Stroke Patients Only)       Balance                                             Pertinent Vitals/Pain Pain Assessment Pain Assessment: Faces Faces Pain Scale: Hurts even more Pain Location: B feet Pain Descriptors / Indicators: Aching, Discomfort, Grimacing, Guarding Pain Intervention(s): Limited activity within patient's tolerance, Monitored during session, Repositioned    Home Living Family/patient expects to be discharged to:: Skilled nursing facility                   Additional Comments: LTC at [facility]    Prior Function Prior Level of Function : Needs assist             Mobility Comments: SPT to from w/c at baseline with assistance from staff ADLs Comments:  Assist for ADL from staff and set up of meal for pt to feed self with R hand. OT called staff at [facility] to confirm and they report he also transfers to commode for toileting.     Extremity/Trunk Assessment   Upper Extremity Assessment Upper Extremity Assessment: Defer to OT evaluation LUE Deficits / Details: contracture from prior CVA    Lower Extremity Assessment Lower Extremity Assessment: Generalized weakness (difficult to assess 2/2 B foot pain)       Communication   Communication Communication: Impaired Factors Affecting Communication: Difficulty expressing self;Reduced clarity of speech    Cognition Arousal:  Alert Behavior During Therapy: Flat affect   PT - Cognitive impairments: No apparent impairments                         Following commands: Intact       Cueing Cueing Techniques: Verbal cues, Gestural cues     General Comments      Exercises     Assessment/Plan    PT Assessment Patient needs continued PT services  PT Problem List Decreased strength;Decreased activity tolerance;Decreased balance;Decreased mobility       PT Treatment Interventions DME instruction;Functional mobility training;Therapeutic activities;Therapeutic exercise;Balance training;Neuromuscular re-education;Patient/family education    PT Goals (Current goals can be found in the Care Plan section)  Acute Rehab PT Goals Patient Stated Goal: did not state PT Goal Formulation: With patient Time For Goal Achievement: 12/10/24 Potential to Achieve Goals: Fair    Frequency Min 1X/week     Co-evaluation               AM-PAC PT 6 Clicks Mobility  Outcome Measure Help needed turning from your back to your side while in a flat bed without using bedrails?: Total Help needed moving from lying on your back to sitting on the side of a flat bed without using bedrails?: Total Help needed moving to and from a bed to a chair (including a wheelchair)?: Total Help needed standing up from a chair using your arms (e.g., wheelchair or bedside chair)?: Total Help needed to walk in hospital room?: Total Help needed climbing 3-5 steps with a railing? : Total 6 Click Score: 6    End of Session   Activity Tolerance: Patient limited by pain Patient left: in bed;with call bell/phone within reach;Other (comment) (X-ray tech in room) Nurse Communication: Mobility status PT Visit Diagnosis: Unsteadiness on feet (R26.81);Other abnormalities of gait and mobility (R26.89);Muscle weakness (generalized) (M62.81)    Time: 9099-9084 PT Time Calculation (min) (ACUTE ONLY): 15 min   Charges:   PT  Evaluation $PT Eval Moderate Complexity: 1 Mod   PT General Charges $$ ACUTE PT VISIT: 1 Visit         Maryanne Finder, PT, DPT Physical Therapist - [facility]  [facility]   Ammarie Matsuura A Kherington Meraz 11/26/2024, 10:18 AM

## 2024-11-26 NOTE — Progress Notes (Signed)
 OT Cancellation Note  Patient Details Name: [name] MRN: [id number] DOB: Aug 19, 1961   Cancelled Treatment:    Reason Eval/Treat Not Completed: Patient at procedure or test/ unavailable;Other (comment) (patient in HD)  [name] Clause 11/26/2024, 1:56 PM

## 2024-11-26 NOTE — Progress Notes (Signed)
 " Progress Note   Patient: [name] FMW:[id number] DOB: 10/06/1961 DOA: 11/24/2024     1 DOS: the patient was seen and examined on 11/26/2024     Brief hospital course: From HPI [name] is a 64 y.o. male with medical history significant of ESRD on hemodialysis, GERD, congestive heart failure, diabetes mellitus type 2, glaucoma, hyperlipidemia, hypertension, osteoarthritis, vitamin D  deficiency, patient was otherwise well until his lab results today showed abnormal renal function as well as hyperkalemia and therefore brought into the emergency room for further management.  I did tell patient will be seen he denied any acute complaints except feeling cold.     ED course: Upon arrival to the emergency room patient was found to have temperature 98.2, respiratory rate 20 pulse 90, blood pressure 175/69 saturating 95% on room air.  Lab results shows potassium 5.5, ED physician discussed with nephrologist who has agreed to initiate hemodialysis    Assessment and Plan:    ESRD on hemodialysis Acute metabolic acidosis Continue hemodialysis Monitor renal function closely Nephrologist on board  Patient scheduled for another hemodialysis today Nephrology is planning next dialysis on Monday I have informed TOC about outpatient dialysis arrangement   Normocytic anemia likely secondary to anemia of chronic kidney disease Monitor CBC   GERD Continue Protonix   Chronic diastolic congestive heart failure Monitor input and output Daily weighing Continue lisinopril   Type 2 diabetes mellitus Continue insulin  therapy Monitor glucose   Hyperlipidemia-continue statin therapy   Essential hypertension Continue home medications   Osteoarthritis Continue as needed pain medication   vitamin D  deficiency,  Continue oral supplementation   DVT prophylaxis-on heparin       Advance Care Planning:   Code Status: Full Code full code   Consults: Nephrology   Family Communication:  None present at bedside     Subjective:  Seen and examined at bedside this morning Denies nausea vomiting abdominal pain or chest pain   Physical Exam: General exam: Elderly male laying in bed in no acute distress Respiratory system: Decreased air entry bibasilarly Cardiovascular system: S1 & S2+.  Gastrointestinal system: abd is soft, NT, ND & hypoactive bowel sounds Central nervous system: alert & oriented. Moves all extremities  Musculoskeletal: [Bilateral lower extremity pitting edema Psychiatry: judgement and insight appears at baseline. Flat mood and affect      Data Reviewed:    Latest Ref Rng & Units 11/25/2024    4:45 AM 11/24/2024    1:18 PM 03/29/2024    4:44 AM  CBC  WBC 4.0 - 10.5 K/uL 4.2  5.5  9.0   Hemoglobin 13.0 - 17.0 g/dL 8.0  8.4  8.1   Hematocrit 39.0 - 52.0 % 25.1  26.3  25.1   Platelets 150 - 400 K/uL 191  195  213        Latest Ref Rng & Units 11/26/2024    4:25 AM 11/25/2024    4:45 AM 11/24/2024    1:18 PM  BMP  Glucose 70 - 99 mg/dL 86  85  842   BUN 8 - 23 mg/dL 68  89  884   Creatinine 0.61 - 1.24 mg/dL 3.95  3.40  1.70   Sodium 135 - 145 mmol/L 137  140  139   Potassium 3.5 - 5.1 mmol/L 4.6  5.4  5.5   Chloride 98 - 111 mmol/L 98  103  104   CO2 22 - 32 mmol/L 24  22  17    Calcium   8.9 - 10.3 mg/dL 9.0  9.1  9.6      Vitals:   11/25/24 2112 11/26/24 0344 11/26/24 0358 11/26/24 1016  BP: 132/63 137/65  126/71  Pulse: 78   95  Resp: 16 16  17   Temp: 98.8 F (37.1 C) 98.6 F (37 C)  98.3 F (36.8 C)  TempSrc: Oral Oral    SpO2: 98% 98%  98%  Weight:   82.6 kg   Height:         Author: Drue ONEIDA Potter, MD 11/26/2024 1:12 PM  For on call review [email or web address].  "

## 2024-11-26 NOTE — Progress Notes (Signed)
" °   11/26/24 1641  Vitals  BP 138/78  BP Location Right Arm  BP Method Automatic  Patient Position (if appropriate) Lying  Pulse Rate 81  Pulse Rate Source Monitor  Resp 13  Weight 80.5 kg  Type of Weight Post-Dialysis  Oxygen Therapy  SpO2 100 %  O2 Device Room Air  Patient Activity (if Appropriate) In bed  Pulse Oximetry Type Continuous  Post Treatment  Dialyzer Clearance Lightly streaked  Hemodialysis Intake (mL) 0 mL  Liters Processed 42.8  Fluid Removed (mL) 800 mL  Tolerated HD Treatment Yes  Post-Hemodialysis Comments tx complete (Patient experienced hypotension in the initail minutes of HD. Blood pressure returned to baseline with a saline bolus of 200cc. Otherwise treatment uneventful. Reticrit 4000 units have been given. Access is positive for thrill and bruit.)  AVG/AVF Arterial Site Held (minutes) 5 minutes  AVG/AVF Venous Site Held (minutes) 5 minutes  Fistula / Graft Left Forearm Arteriovenous fistula  Placement Date/Time: 11/09/21 1111   Orientation: Left  Access Location: Forearm  Access Type: Arteriovenous fistula  Site Condition No complications  Status Deaccessed  Drainage Description None    "

## 2024-11-26 NOTE — Progress Notes (Signed)
 [facility], [place] 11/26/2024  Subjective:   LOS: 1  Patient seen laying in bed Easily aroused Denies shortness of breath Room air    Objective:  Vital signs in last 24 hours:  Temp:  [98 F (36.7 C)-98.8 F (37.1 C)] 98.3 F (36.8 C) (01/02 1016) Pulse Rate:  [78-95] 95 (01/02 1016) Resp:  [16-18] 17 (01/02 1016) BP: (117-137)/(59-71) 126/71 (01/02 1016) SpO2:  [98 %-99 %] 98 % (01/02 1016) Weight:  [82.6 kg] 82.6 kg (01/02 0358)  Weight change: -3.862 kg Filed Weights   11/25/24 0735 11/25/24 0950 11/26/24 0358  Weight: 79.6 kg 79.4 kg 82.6 kg    Intake/Output:    Intake/Output Summary (Last 24 hours) at 11/26/2024 1310 Last data filed at 11/26/2024 0500 Gross per 24 hour  Intake 480 ml  Output 600 ml  Net -120 ml     Physical Exam: General: No acute distress  HEENT Moist oral mucous membrane  Pulm/lungs Normal breathing effort on room air  CVS/Heart Regular, no rub  Abdomen:  Soft, nontender, nondistended  Extremities: 1+ edema bilaterally  Neurologic: Alert, able to answer questions appropriately  Skin: No acute rashes  Access: Left upper extremity AV fistula       Basic Metabolic Panel:  Recent Labs  Lab 11/24/24 1318 11/25/24 0445 11/26/24 0425  NA 139 140 137  K 5.5* 5.4* 4.6  CL 104 103 98  CO2 17* 22 24  GLUCOSE 157* 85 86  BUN 115* 89* 68*  CREATININE 8.29* 6.59* 6.04*  CALCIUM  9.6 9.1 9.0     CBC: Recent Labs  Lab 11/24/24 1318 11/25/24 0445  WBC 5.5 4.2  NEUTROABS 3.2  --   HGB 8.4* 8.0*  HCT 26.3* 25.1*  MCV 86.5 85.1  PLT 195 191      Lab Results  Component Value Date   HEPBSAG NON REACTIVE 11/24/2024   HEPBSAB Reactive (A) 11/23/2021   HEPBIGM NON REACTIVE 11/21/2021      Microbiology:  No results found for this or any previous visit (from the past 240 hours).  Coagulation Studies: No results for input(s): LABPROT, INR in the last 72 hours.  Urinalysis: No results for  input(s): COLORURINE, LABSPEC, PHURINE, GLUCOSEU, HGBUR, BILIRUBINUR, KETONESUR, PROTEINUR, UROBILINOGEN, NITRITE, LEUKOCYTESUR in the last 72 hours.  Invalid input(s): APPERANCEUR    Imaging: DG Chest 1 View Result Date: 11/26/2024 CLINICAL DATA:  End-stage chronic kidney disease. Rule out tuberculosis for outpatient clinic placement. EXAM: DG CHEST 1V COMPARISON:  Chest radiograph 11/20/2021 FINDINGS: Patient is rotated. The heart is normal in size. Mediastinal contours are normal. Subsegmental scarring at the left lung base. No acute airspace disease. No pleural effusion or pneumothorax. Remote right rib fractures. IMPRESSION: No acute chest findings.  No radiographic findings of tuberculosis. Electronically Signed   By: Andrea Gasman M.D.   On: 11/26/2024 12:07     Medications:     amLODipine   10 mg Oral Daily   ascorbic acid   250 mg Oral BID   aspirin  EC  81 mg Oral Daily   atorvastatin   40 mg Oral QHS   Chlorhexidine  Gluconate Cloth  6 each Topical Q0600   cholecalciferol   2,000 Units Oral Daily   clopidogrel   75 mg Oral Q0600   epoetin alfa-epbx (RETACRIT) injection  4,000 Units Intravenous Q M,W,F-1800   ferrous sulfate   325 mg Oral QODAY   heparin   5,000 Units Subcutaneous Q8H   hydrALAZINE   50 mg Oral TID   insulin  aspart  0-5 Units Subcutaneous QHS   insulin  aspart  0-9 Units Subcutaneous TID WC   lisinopril  10 mg Oral Daily   pantoprazole  40 mg Oral Daily   pregabalin   75 mg Oral QHS   rOPINIRole   0.25 mg Oral QHS   senna-docusate  1 tablet Oral BID   sertraline   50 mg Oral Daily   acetaminophen , oxyCODONE   Assessment/ Plan:  64 y.o. male with diabetes, hypertension, stroke with left upper extremity weakness, peripheral arterial disease, diabetic nephropathy/CKD, history of right femoral endarterectomy was admitted on 11/24/2024 for  Principal Problem:   ESRD on hemodialysis (HCC)  Hyperkalemia [E87.5] Uremic acidosis [N25.89] AKI  (acute kidney injury) [N17.9] ESRD on hemodialysis (HCC) [N18.6, Z99.2]  #.  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  Due to no comorbidities, it is safer to initiate hemodialysis in the inpatient setting. He has a well-developed left upper extremity AV fistula which will be used. Fistula access for treatment, no complications Receiving dialysis today, UF 1L as tolerated. Next treatment scheduled for Monday Awaiting outpatient clinic assignment  #. Anemia of CKD  Lab Results  Component Value Date   HGB 8.0 (L) 11/25/2024   Low dose EPO with HD. Hgb stable  #. Secondary hyperparathyroidism of renal origin N 25.81      Component Value Date/Time   PTH 146 (H) 11/21/2021 1610   Lab Results  Component Value Date   PHOS 4.2 03/29/2024   Calcium  acceptable. Awaiting phos add on. Continue cholecalciferol     #. Diabetes type 2 with CKD Hgb A1c MFr Bld (%)  Date Value  11/24/2024 6.0 (H)   Primary team will continue to monitor.    LOS: 1 Faith Harris 1/2/20261:10 PM  [address] [place], [place] [phone]

## 2024-11-27 DIAGNOSIS — E875 Hyperkalemia: Secondary | ICD-10-CM | POA: Diagnosis not present

## 2024-11-27 DIAGNOSIS — Z992 Dependence on renal dialysis: Secondary | ICD-10-CM | POA: Diagnosis not present

## 2024-11-27 DIAGNOSIS — I132 Hypertensive heart and chronic kidney disease with heart failure and with stage 5 chronic kidney disease, or end stage renal disease: Secondary | ICD-10-CM | POA: Diagnosis not present

## 2024-11-27 DIAGNOSIS — N186 End stage renal disease: Secondary | ICD-10-CM | POA: Diagnosis not present

## 2024-11-27 LAB — BASIC METABOLIC PANEL WITH GFR
Anion gap: 12 (ref 5–15)
BUN: 39 mg/dL — ABNORMAL HIGH (ref 8–23)
CO2: 28 mmol/L (ref 22–32)
Calcium: 9.2 mg/dL (ref 8.9–10.3)
Chloride: 96 mmol/L — ABNORMAL LOW (ref 98–111)
Creatinine, Ser: 4.49 mg/dL — ABNORMAL HIGH (ref 0.61–1.24)
GFR, Estimated: 14 mL/min — ABNORMAL LOW
Glucose, Bld: 121 mg/dL — ABNORMAL HIGH (ref 70–99)
Potassium: 4.2 mmol/L (ref 3.5–5.1)
Sodium: 135 mmol/L (ref 135–145)

## 2024-11-27 LAB — CBC WITH DIFFERENTIAL/PLATELET
Abs Immature Granulocytes: 0.03 K/uL (ref 0.00–0.07)
Basophils Absolute: 0 K/uL (ref 0.0–0.1)
Basophils Relative: 0 %
Eosinophils Absolute: 0.1 K/uL (ref 0.0–0.5)
Eosinophils Relative: 2 %
HCT: 24.7 % — ABNORMAL LOW (ref 39.0–52.0)
Hemoglobin: 8 g/dL — ABNORMAL LOW (ref 13.0–17.0)
Immature Granulocytes: 1 %
Lymphocytes Relative: 17 %
Lymphs Abs: 1 K/uL (ref 0.7–4.0)
MCH: 26.9 pg (ref 26.0–34.0)
MCHC: 32.4 g/dL (ref 30.0–36.0)
MCV: 83.2 fL (ref 80.0–100.0)
Monocytes Absolute: 1.2 K/uL — ABNORMAL HIGH (ref 0.1–1.0)
Monocytes Relative: 19 %
Neutro Abs: 3.9 K/uL (ref 1.7–7.7)
Neutrophils Relative %: 61 %
Platelets: 184 K/uL (ref 150–400)
RBC: 2.97 MIL/uL — ABNORMAL LOW (ref 4.22–5.81)
RDW: 14.8 % (ref 11.5–15.5)
WBC: 6.2 K/uL (ref 4.0–10.5)
nRBC: 0 % (ref 0.0–0.2)

## 2024-11-27 LAB — GLUCOSE, CAPILLARY
Glucose-Capillary: 87 mg/dL (ref 70–99)
Glucose-Capillary: 180 mg/dL — ABNORMAL HIGH (ref 70–99)
Glucose-Capillary: 150 mg/dL — ABNORMAL HIGH (ref 70–99)
Glucose-Capillary: 131 mg/dL — ABNORMAL HIGH (ref 70–99)

## 2024-11-27 MED ORDER — LIDOCAINE 5 % EX PTCH
1.0000 | MEDICATED_PATCH | CUTANEOUS | Status: DC
  Administered 2024-11-27 – 2024-11-28 (×2): 1 via TRANSDERMAL
  Filled 2024-11-27 (×2): qty 1

## 2024-11-27 NOTE — TOC Initial Note (Signed)
 Transition of Care [facility]) - Initial/Assessment Note    Patient Details  Name: [name] MRN: [id number] Date of Birth: [birth year]  [facility]) CM/SW Contact:    Victory Jackquline RAMAN, RN Phone Number: 11/27/2024, 4:31 PM  Clinical Narrative:                 TOC chart review completed, patient is a LTC resident at [facility] and will return when medically stable for discharge. Patient will need ambulance transportation set up at the time of discharge. RNCM will continue to follow for discharge planning needs.   Expected Discharge Plan: Skilled Nursing Facility Barriers to Discharge: Continued Medical Work up   Patient Goals and CMS Choice            Expected Discharge Plan and Services       Living arrangements for the past 2 months: Skilled Nursing Facility                                      Prior Living Arrangements/Services Living arrangements for the past 2 months: Skilled Nursing Facility Lives with:: Roommate Patient language and need for interpreter reviewed:: Yes Do you feel safe going back to the place where you live?: Yes      Need for Family Participation in Patient Care: Yes (Comment) Care giver support system in place?: Yes (comment)   Criminal Activity/Legal Involvement Pertinent to Current Situation/Hospitalization: No - Comment as needed  Activities of Daily Living   ADL Screening (condition at time of admission) Independently performs ADLs?: No Does the patient have a NEW difficulty with bathing/dressing/toileting/self-feeding that is expected to last >3 days?: No Does the patient have a NEW difficulty with getting in/out of bed, walking, or climbing stairs that is expected to last >3 days?: No Does the patient have a NEW difficulty with communication that is expected to last >3 days?: No Is the patient deaf or have difficulty hearing?: No Does the patient have difficulty seeing, even when wearing glasses/contacts?:  Yes Does the patient have difficulty concentrating, remembering, or making decisions?: No  Permission Sought/Granted                  Emotional Assessment Appearance:: Appears stated age Attitude/Demeanor/Rapport: Gracious Affect (typically observed): Calm, Quiet Orientation: : Oriented to Self, Oriented to  Time, Oriented to Place, Oriented to Situation Alcohol / Substance Use: Not Applicable Psych Involvement: No (comment)  Admission diagnosis:  Hyperkalemia [E87.5] Uremic acidosis [N25.89] AKI (acute kidney injury) [N17.9] ESRD on hemodialysis (HCC) [N18.6, Z99.2] Patient Active Problem List   Diagnosis Date Noted   ESRD on hemodialysis (HCC) 11/24/2024   Elevated C-reactive protein (CRP) 07/29/2024   Elevated sed rate 07/29/2024   Elevated BUN 07/29/2024   Elevated serum creatinine 07/29/2024   Decreased glomerular filtration rate (GFR) 07/29/2024   Cervical fracture of C7 vertebra, sequela 07/21/2024   Chronic pain syndrome 07/21/2024   Pharmacologic therapy 07/21/2024   Disorder of skeletal system 07/21/2024   Problems influencing health status 07/21/2024   Abnormal MRI, cervical spine (12/21/2023) 07/21/2024   Abnormal angiogram of head (12/22/2023) 07/21/2024   Chronic foot pain (Left) 07/21/2024   Mild cognitive impairment 07/21/2024   Legally blind 07/21/2024   History of stroke with residual deficit 07/21/2024   Chronic anticoagulation (Plavix ) 07/21/2024   Pressure injury of skin 03/29/2024   Atherosclerosis of artery of extremity with ulceration (HCC)  03/29/2024   PAD (peripheral artery disease) 03/23/2024   Acute CVA (cerebrovascular accident) (HCC) 12/22/2023   Hypertensive urgency 12/22/2023   Dyslipidemia 12/22/2023   Peripheral neuropathy 12/22/2023   CAD (coronary artery disease) 01/30/2022   Acute urinary retention 11/23/2021   History of hepatitis C 11/22/2021   Bacteremia due to Klebsiella pneumoniae 11/22/2021   UTI (urinary tract  infection) 11/21/2021   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) 11/21/2021   Severe sepsis (HCC) 11/21/2021   Allergic rhinitis 10/22/2021   Generalized anxiety disorder 10/22/2021   GERD (gastroesophageal reflux disease) 10/22/2021   Chronic hepatitis (HCC) 10/22/2021   HL (hearing loss) 10/22/2021   Primary hypertension 10/22/2021   Iron deficiency anemia 10/22/2021   Hyperlipidemia 10/22/2021   Ischemic heart disease 10/22/2021   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HCC) 10/22/2021   Chronic renal disease, stage V (HCC) 10/22/2021   Difficulty walking 12/10/2020   Tremor 11/04/2018   Peripheral vascular disease 01/01/2018   Non-insulin  dependent type 2 diabetes mellitus (HCC) 06/02/2017   Anemia 06/02/2017   Muscle weakness 06/02/2017   Hypertensive disorder 06/02/2017   Left leg pain 08/29/2015   Cerumen impaction 05/31/2013   Hearing loss of both ears 05/31/2013   Chronic osteomyelitis of lower leg (HCC) 07/23/2012   PCP:  Austin Candyce Stall, MD Pharmacy:   [facility]. [place], [facility] - [address] [place] [address] Phone: [phone] Fax: [phone]     Social Drivers of Health (SDOH) Social History: SDOH Screenings   Food Insecurity: Patient Declined (11/24/2024)  Housing: Unknown (11/24/2024)  Transportation Needs: Patient Declined (11/24/2024)  Utilities: Patient Declined (11/24/2024)  Depression (PHQ2-9): Low Risk (07/21/2024)  Financial Resource Strain: Low Risk  (12/18/2023)   Received from [facility]  Social Connections: Unknown (11/24/2024)  Tobacco Use: High Risk (11/24/2024)   SDOH Interventions:     Readmission Risk Interventions    03/26/2024   11:46 AM  Readmission Risk Prevention Plan  PCP or Specialist Appt within 3-5 Days Complete  Social Work Consult for Recovery Care Planning/Counseling Complete  Palliative Care Screening Not Applicable  Medication Review Oceanographer) Complete

## 2024-11-27 NOTE — Progress Notes (Signed)
 " Progress Note   Patient: [name] FMW:[id number] DOB: Jul 28, 1961 DOA: 11/24/2024     2 DOS: the patient was seen and examined on 11/27/2024     Brief hospital course: From HPI [name] is a 64 y.o. male with medical history significant of ESRD on hemodialysis, GERD, congestive heart failure, diabetes mellitus type 2, glaucoma, hyperlipidemia, hypertension, osteoarthritis, vitamin D  deficiency, patient was otherwise well until his lab results today showed abnormal renal function as well as hyperkalemia and therefore brought into the emergency room for further management.  I did tell patient will be seen he denied any acute complaints except feeling cold.     ED course: Upon arrival to the emergency room patient was found to have temperature 98.2, respiratory rate 20 pulse 90, blood pressure 175/69 saturating 95% on room air.  Lab results shows potassium 5.5, ED physician discussed with nephrologist who has agreed to initiate hemodialysis    Assessment and Plan:    ESRD on hemodialysis Acute metabolic acidosis Continue hemodialysis Monitor renal function closely Nephrologist on board for dialysis needs Nephrology is planning next dialysis on Monday I have informed TOC about outpatient dialysis arrangement   Normocytic anemia likely secondary to anemia of chronic kidney disease Monitor CBC   GERD Continue Protonix    Chronic diastolic congestive heart failure Monitor input and output Daily weighing Continue lisinopril    Type 2 diabetes mellitus Continue insulin  therapy Monitor glucose   Hyperlipidemia-continue statin therapy   Essential hypertension Continue home medications   Osteoarthritis Continue as needed pain medication   vitamin D  deficiency,  Continue oral supplementation   DVT prophylaxis-on heparin       Advance Care Planning:   Code Status: Full Code full code   Consults: Nephrology   Family Communication: None present at bedside      Subjective:  Seen and examined at bedside this morning Denies nausea vomiting abdominal pain or chest pain   Physical Exam: General exam: Elderly male laying in bed in no acute distress Respiratory system: Decreased air entry bibasilarly Cardiovascular system: S1 & S2+.  Gastrointestinal system: abd is soft, NT, ND & hypoactive bowel sounds Central nervous system: alert & oriented. Moves all extremities  Musculoskeletal: [Bilateral lower extremity pitting edema Psychiatry: judgement and insight appears at baseline. Flat mood and affect      Data Reviewed:    Latest Ref Rng & Units 11/27/2024    4:41 AM 11/25/2024    4:45 AM 11/24/2024    1:18 PM  CBC  WBC 4.0 - 10.5 K/uL 6.2  4.2  5.5   Hemoglobin 13.0 - 17.0 g/dL 8.0  8.0  8.4   Hematocrit 39.0 - 52.0 % 24.7  25.1  26.3   Platelets 150 - 400 K/uL 184  191  195        Latest Ref Rng & Units 11/27/2024    4:41 AM 11/26/2024    4:25 AM 11/25/2024    4:45 AM  BMP  Glucose 70 - 99 mg/dL 878  86  85   BUN 8 - 23 mg/dL 39  68  89   Creatinine 0.61 - 1.24 mg/dL 5.50  3.95  3.40   Sodium 135 - 145 mmol/L 135  137  140   Potassium 3.5 - 5.1 mmol/L 4.2  4.6  5.4   Chloride 98 - 111 mmol/L 96  98  103   CO2 22 - 32 mmol/L 28  24  22    Calcium  8.9 - 10.3 mg/dL  9.2  9.0  9.1      Vitals:   11/26/24 1641 11/26/24 2203 11/27/24 0355 11/27/24 0744  BP: 138/78 136/71 133/65 135/67  Pulse: 81 80 79 83  Resp: 13 16 16 17   Temp:  98.6 F (37 C) 98.2 F (36.8 C) 98.8 F (37.1 C)  TempSrc:  Oral Oral   SpO2: 100% 100% 97% 97%  Weight: 80.5 kg     Height:        Author: Drue ONEIDA Potter, MD 11/27/2024 4:06 PM  For on call review [email or web address].  "

## 2024-11-27 NOTE — Progress Notes (Signed)
 [facility], [place] 11/27/2024  Subjective:   LOS: 2  Patient seen resting in bed, eyes closed Room air Trace lower extremity edema    Objective:  Vital signs in last 24 hours:  Temp:  [98.2 F (36.8 C)-98.8 F (37.1 C)] 98.8 F (37.1 C) (01/03 0744) Pulse Rate:  [77-87] 83 (01/03 0744) Resp:  [11-20] 17 (01/03 0744) BP: (81-138)/(55-78) 135/67 (01/03 0744) SpO2:  [96 %-100 %] 97 % (01/03 0744) Weight:  [80.5 kg-81.3 kg] 80.5 kg (01/02 1641)  Weight change: 1.7 kg Filed Weights   11/26/24 0358 11/26/24 1331 11/26/24 1641  Weight: 82.6 kg 81.3 kg 80.5 kg    Intake/Output:    Intake/Output Summary (Last 24 hours) at 11/27/2024 1134 Last data filed at 11/27/2024 0950 Gross per 24 hour  Intake 480 ml  Output 800 ml  Net -320 ml     Physical Exam: General: No acute distress  HEENT Moist oral mucous membrane  Pulm/lungs Normal breathing effort on room air  CVS/Heart Regular, no rub  Abdomen:  Soft, nontender, nondistended  Extremities: Trace edema bilaterally  Neurologic: Alert, able to answer questions appropriately  Skin: No acute rashes  Access: Left upper extremity AV fistula       Basic Metabolic Panel:  Recent Labs  Lab 11/24/24 1318 11/25/24 0445 11/26/24 0425 11/27/24 0441  NA 139 140 137 135  K 5.5* 5.4* 4.6 4.2  CL 104 103 98 96*  CO2 17* 22 24 28   GLUCOSE 157* 85 86 121*  BUN 115* 89* 68* 39*  CREATININE 8.29* 6.59* 6.04* 4.49*  CALCIUM  9.6 9.1 9.0 9.2  PHOS  --   --  4.9*  --      CBC: Recent Labs  Lab 11/24/24 1318 11/25/24 0445 11/27/24 0441  WBC 5.5 4.2 6.2  NEUTROABS 3.2  --  3.9  HGB 8.4* 8.0* 8.0*  HCT 26.3* 25.1* 24.7*  MCV 86.5 85.1 83.2  PLT 195 191 184      Lab Results  Component Value Date   HEPBSAG NON REACTIVE 11/24/2024   HEPBSAB Reactive (A) 11/23/2021   HEPBIGM NON REACTIVE 11/21/2021      Microbiology:  No results found for this or any previous visit (from the past 240  hours).  Coagulation Studies: No results for input(s): LABPROT, INR in the last 72 hours.  Urinalysis: No results for input(s): COLORURINE, LABSPEC, PHURINE, GLUCOSEU, HGBUR, BILIRUBINUR, KETONESUR, PROTEINUR, UROBILINOGEN, NITRITE, LEUKOCYTESUR in the last 72 hours.  Invalid input(s): APPERANCEUR    Imaging: DG Chest 1 View Result Date: 11/26/2024 CLINICAL DATA:  End-stage chronic kidney disease. Rule out tuberculosis for outpatient clinic placement. EXAM: DG CHEST 1V COMPARISON:  Chest radiograph 11/20/2021 FINDINGS: Patient is rotated. The heart is normal in size. Mediastinal contours are normal. Subsegmental scarring at the left lung base. No acute airspace disease. No pleural effusion or pneumothorax. Remote right rib fractures. IMPRESSION: No acute chest findings.  No radiographic findings of tuberculosis. Electronically Signed   By: Andrea Gasman M.D.   On: 11/26/2024 12:07     Medications:     amLODipine   10 mg Oral Daily   ascorbic acid   250 mg Oral BID   aspirin  EC  81 mg Oral Daily   atorvastatin   40 mg Oral QHS   Chlorhexidine  Gluconate Cloth  6 each Topical Q0600   cholecalciferol   2,000 Units Oral Daily   clopidogrel   75 mg Oral Q0600   epoetin  alfa-epbx (RETACRIT ) injection  4,000 Units Intravenous  Q M,W,F-1800   ferrous sulfate   325 mg Oral QODAY   heparin   5,000 Units Subcutaneous Q8H   hydrALAZINE   50 mg Oral TID   insulin  aspart  0-5 Units Subcutaneous QHS   insulin  aspart  0-9 Units Subcutaneous TID WC   lisinopril   10 mg Oral Daily   pantoprazole   40 mg Oral Daily   pregabalin   75 mg Oral QHS   rOPINIRole   0.25 mg Oral QHS   senna-docusate  1 tablet Oral BID   sertraline   50 mg Oral Daily   acetaminophen , oxyCODONE   Assessment/ Plan:  64 y.o. male with diabetes, hypertension, stroke with left upper extremity weakness, peripheral arterial disease, diabetic nephropathy/CKD, history of right femoral endarterectomy was admitted  on 11/24/2024 for  Principal Problem:   ESRD on hemodialysis (HCC)  Hyperkalemia [E87.5] Uremic acidosis [N25.89] AKI (acute kidney injury) [N17.9] ESRD on hemodialysis (HCC) [N18.6, Z99.2]  #.  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  Due to no comorbidities, it is safer to initiate hemodialysis in the inpatient setting. He has a well-developed left upper extremity AV fistula which will be used. Patient received third dialysis treatment yesterday, UF 800 mL achieved.  Next treatment scheduled for Monday. Awaiting outpatient clinic assignment  #. Anemia of CKD  Lab Results  Component Value Date   HGB 8.0 (L) 11/27/2024   May increase IV Retacrit  with next dialysis treatment.  Will continue to monitor labs.  #. Secondary hyperparathyroidism of renal origin N 25.81      Component Value Date/Time   PTH 146 (H) 11/21/2021 1610   Lab Results  Component Value Date   PHOS 4.9 (H) 11/26/2024   Calcium  and phosphorus within optimal range.  Continue cholecalciferol     #. Diabetes type 2 with CKD Hgb A1c MFr Bld (%)  Date Value  11/24/2024 6.0 (H)  Glucose well-controlled. Primary team will continue to monitor.    LOS: 2 Faith Harris 1/3/202611:34 AM  [address] [place], [place] [phone]

## 2024-11-28 DIAGNOSIS — Z992 Dependence on renal dialysis: Secondary | ICD-10-CM | POA: Diagnosis not present

## 2024-11-28 DIAGNOSIS — N186 End stage renal disease: Secondary | ICD-10-CM | POA: Diagnosis not present

## 2024-11-28 DIAGNOSIS — E875 Hyperkalemia: Secondary | ICD-10-CM | POA: Diagnosis not present

## 2024-11-28 DIAGNOSIS — I132 Hypertensive heart and chronic kidney disease with heart failure and with stage 5 chronic kidney disease, or end stage renal disease: Secondary | ICD-10-CM | POA: Diagnosis not present

## 2024-11-28 LAB — BASIC METABOLIC PANEL WITH GFR
Anion gap: 14 (ref 5–15)
BUN: 48 mg/dL — ABNORMAL HIGH (ref 8–23)
CO2: 26 mmol/L (ref 22–32)
Calcium: 9.6 mg/dL (ref 8.9–10.3)
Chloride: 100 mmol/L (ref 98–111)
Creatinine, Ser: 5.95 mg/dL — ABNORMAL HIGH (ref 0.61–1.24)
GFR, Estimated: 10 mL/min — ABNORMAL LOW
Glucose, Bld: 100 mg/dL — ABNORMAL HIGH (ref 70–99)
Potassium: 4.1 mmol/L (ref 3.5–5.1)
Sodium: 140 mmol/L (ref 135–145)

## 2024-11-28 LAB — CBC WITH DIFFERENTIAL/PLATELET
Abs Immature Granulocytes: 0.04 K/uL (ref 0.00–0.07)
Basophils Absolute: 0 K/uL (ref 0.0–0.1)
Basophils Relative: 0 %
Eosinophils Absolute: 0.2 K/uL (ref 0.0–0.5)
Eosinophils Relative: 3 %
HCT: 26.2 % — ABNORMAL LOW (ref 39.0–52.0)
Hemoglobin: 8.1 g/dL — ABNORMAL LOW (ref 13.0–17.0)
Immature Granulocytes: 1 %
Lymphocytes Relative: 17 %
Lymphs Abs: 1.1 K/uL (ref 0.7–4.0)
MCH: 26.9 pg (ref 26.0–34.0)
MCHC: 30.9 g/dL (ref 30.0–36.0)
MCV: 87 fL (ref 80.0–100.0)
Monocytes Absolute: 1.2 K/uL — ABNORMAL HIGH (ref 0.1–1.0)
Monocytes Relative: 18 %
Neutro Abs: 4.3 K/uL (ref 1.7–7.7)
Neutrophils Relative %: 61 %
Platelets: 194 K/uL (ref 150–400)
RBC: 3.01 MIL/uL — ABNORMAL LOW (ref 4.22–5.81)
RDW: 15 % (ref 11.5–15.5)
WBC: 6.8 K/uL (ref 4.0–10.5)
nRBC: 0 % (ref 0.0–0.2)

## 2024-11-28 LAB — GLUCOSE, CAPILLARY
Glucose-Capillary: 96 mg/dL (ref 70–99)
Glucose-Capillary: 195 mg/dL — ABNORMAL HIGH (ref 70–99)
Glucose-Capillary: 86 mg/dL (ref 70–99)
Glucose-Capillary: 152 mg/dL — ABNORMAL HIGH (ref 70–99)

## 2024-11-28 NOTE — Progress Notes (Signed)
 [facility], [place] 11/28/2024  Subjective:   LOS: 3  Patient laying in bed Phone ringing, patient grumbling    Objective:  Vital signs in last 24 hours:  Temp:  [98.7 F (37.1 C)-98.9 F (37.2 C)] 98.9 F (37.2 C) (01/04 0823) Pulse Rate:  [75-85] 85 (01/04 0823) Resp:  [17-19] 19 (01/04 0823) BP: (107-125)/(56-68) 117/65 (01/04 0823) SpO2:  [97 %-100 %] 98 % (01/04 0823) Weight:  [80.6 kg] 80.6 kg (01/04 0500)  Weight change: -0.7 kg Filed Weights   11/26/24 1331 11/26/24 1641 11/28/24 0500  Weight: 81.3 kg 80.5 kg 80.6 kg    Intake/Output:    Intake/Output Summary (Last 24 hours) at 11/28/2024 1159 Last data filed at 11/27/2024 2300 Gross per 24 hour  Intake 240 ml  Output 700 ml  Net -460 ml     Physical Exam: General: No acute distress  HEENT Moist oral mucous membrane  Pulm/lungs Normal breathing effort on room air  CVS/Heart Regular, no rub  Abdomen:  Soft, nontender, nondistended  Extremities: Trace edema bilaterally  Neurologic: Alert, able to answer questions appropriately  Skin: No acute rashes  Access: Left upper extremity AV fistula       Basic Metabolic Panel:  Recent Labs  Lab 11/24/24 1318 11/25/24 0445 11/26/24 0425 11/27/24 0441 11/28/24 0551  NA 139 140 137 135 140  K 5.5* 5.4* 4.6 4.2 4.1  CL 104 103 98 96* 100  CO2 17* 22 24 28 26   GLUCOSE 157* 85 86 121* 100*  BUN 115* 89* 68* 39* 48*  CREATININE 8.29* 6.59* 6.04* 4.49* 5.95*  CALCIUM  9.6 9.1 9.0 9.2 9.6  PHOS  --   --  4.9*  --   --      CBC: Recent Labs  Lab 11/24/24 1318 11/25/24 0445 11/27/24 0441 11/28/24 0551  WBC 5.5 4.2 6.2 6.8  NEUTROABS 3.2  --  3.9 4.3  HGB 8.4* 8.0* 8.0* 8.1*  HCT 26.3* 25.1* 24.7* 26.2*  MCV 86.5 85.1 83.2 87.0  PLT 195 191 184 194      Lab Results  Component Value Date   HEPBSAG NON REACTIVE 11/24/2024   HEPBSAB Reactive (A) 11/23/2021   HEPBIGM NON REACTIVE 11/21/2021      Microbiology:  No  results found for this or any previous visit (from the past 240 hours).  Coagulation Studies: No results for input(s): LABPROT, INR in the last 72 hours.  Urinalysis: No results for input(s): COLORURINE, LABSPEC, PHURINE, GLUCOSEU, HGBUR, BILIRUBINUR, KETONESUR, PROTEINUR, UROBILINOGEN, NITRITE, LEUKOCYTESUR in the last 72 hours.  Invalid input(s): APPERANCEUR    Imaging: No results found.    Medications:     amLODipine   10 mg Oral Daily   ascorbic acid   250 mg Oral BID   aspirin  EC  81 mg Oral Daily   atorvastatin   40 mg Oral QHS   Chlorhexidine  Gluconate Cloth  6 each Topical Q0600   cholecalciferol   2,000 Units Oral Daily   clopidogrel   75 mg Oral Q0600   epoetin  alfa-epbx (RETACRIT ) injection  4,000 Units Intravenous Q M,W,F-1800   ferrous sulfate   325 mg Oral QODAY   heparin   5,000 Units Subcutaneous Q8H   hydrALAZINE   50 mg Oral TID   insulin  aspart  0-5 Units Subcutaneous QHS   insulin  aspart  0-9 Units Subcutaneous TID WC   lidocaine   1 patch Transdermal Q24H   lisinopril   10 mg Oral Daily   pantoprazole   40 mg Oral Daily  pregabalin   75 mg Oral QHS   rOPINIRole   0.25 mg Oral QHS   senna-docusate  1 tablet Oral BID   sertraline   50 mg Oral Daily   acetaminophen , oxyCODONE   Assessment/ Plan:  64 y.o. male with diabetes, hypertension, stroke with left upper extremity weakness, peripheral arterial disease, diabetic nephropathy/CKD, history of right femoral endarterectomy was admitted on 11/24/2024 for  Principal Problem:   ESRD on hemodialysis (HCC)  Hyperkalemia [E87.5] Uremic acidosis [N25.89] AKI (acute kidney injury) [N17.9] ESRD on hemodialysis (HCC) [N18.6, Z99.2]  #.  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  Due to no comorbidities, it is safer to initiate hemodialysis in the inpatient setting. He has a well-developed left upper extremity AV fistula which will be  used. Next treatment scheduled for Monday. Awaiting outpatient clinic assignment  #. Anemia of CKD  Lab Results  Component Value Date   HGB 8.1 (L) 11/28/2024   Will increase to Retacrit  10000 units IV with dialysis.  Will continue to monitor labs.  #. Secondary hyperparathyroidism of renal origin N 25.81      Component Value Date/Time   PTH 146 (H) 11/21/2021 1610   Lab Results  Component Value Date   PHOS 4.9 (H) 11/26/2024   Calcium  and phosphorus within optimal range.  Continue cholecalciferol     #. Diabetes type 2 with CKD Hgb A1c MFr Bld (%)  Date Value  11/24/2024 6.0 (H)   Primary team will continue to monitor.    LOS: 3 Faith Harris 1/4/202611:59 AM  [address] [place], [place] [phone]

## 2024-11-28 NOTE — Progress Notes (Signed)
 " Progress Note   Patient: [name] FMW:[id number] DOB: August 17, 1961 DOA: 11/24/2024     3 DOS: the patient was seen and examined on 11/28/2024    Brief hospital course: From HPI [name] is a 64 y.o. male with medical history significant of ESRD on hemodialysis, GERD, congestive heart failure, diabetes mellitus type 2, glaucoma, hyperlipidemia, hypertension, osteoarthritis, vitamin D  deficiency, patient was otherwise well until his lab results today showed abnormal renal function as well as hyperkalemia and therefore brought into the emergency room for further management.  I did tell patient will be seen he denied any acute complaints except feeling cold.     ED course: Upon arrival to the emergency room patient was found to have temperature 98.2, respiratory rate 20 pulse 90, blood pressure 175/69 saturating 95% on room air.  Lab results shows potassium 5.5, ED physician discussed with nephrologist who has agreed to initiate hemodialysis    Assessment and Plan:    ESRD on hemodialysis Acute metabolic acidosis Continue hemodialysis Monitor renal function closely Nephrologist on board for dialysis needs Nephrology is planning next dialysis on Monday I have informed TOC about outpatient dialysis arrangement   Normocytic anemia likely secondary to anemia of chronic kidney disease Monitor CBC   GERD Continue Protonix    Chronic diastolic congestive heart failure Monitor input and output Daily weighing Continue lisinopril    Type 2 diabetes mellitus Continue insulin  therapy Monitor glucose   Hyperlipidemia-continue statin therapy   Essential hypertension Continue home medications   Osteoarthritis Continue as needed pain medication   vitamin D  deficiency,  Continue oral supplementation   DVT prophylaxis-on heparin       Advance Care Planning:   Code Status: Full Code full code   Consults: Nephrology   Family Communication: None present at bedside      Subjective:  No acute overnight event   Physical Exam: General exam: Elderly male laying in bed in no acute distress Respiratory system: Decreased air entry bibasilarly Cardiovascular system: S1 & S2+.  Gastrointestinal system: abd is soft, NT, ND & hypoactive bowel sounds Central nervous system: alert & oriented. Moves all extremities  Musculoskeletal: [Bilateral lower extremity pitting edema Psychiatry: judgement and insight appears at baseline. Flat mood and affect      Data Reviewed:     Latest Ref Rng & Units 11/28/2024    5:51 AM 11/27/2024    4:41 AM 11/25/2024    4:45 AM  CBC  WBC 4.0 - 10.5 K/uL 6.8  6.2  4.2   Hemoglobin 13.0 - 17.0 g/dL 8.1  8.0  8.0   Hematocrit 39.0 - 52.0 % 26.2  24.7  25.1   Platelets 150 - 400 K/uL 194  184  191        Latest Ref Rng & Units 11/28/2024    5:51 AM 11/27/2024    4:41 AM 11/26/2024    4:25 AM  BMP  Glucose 70 - 99 mg/dL 899  878  86   BUN 8 - 23 mg/dL 48  39  68   Creatinine 0.61 - 1.24 mg/dL 4.04  5.50  3.95   Sodium 135 - 145 mmol/L 140  135  137   Potassium 3.5 - 5.1 mmol/L 4.1  4.2  4.6   Chloride 98 - 111 mmol/L 100  96  98   CO2 22 - 32 mmol/L 26  28  24    Calcium  8.9 - 10.3 mg/dL 9.6  9.2  9.0     Vitals:  11/27/24 2154 11/28/24 0430 11/28/24 0500 11/28/24 0823  BP: 113/68 125/65  117/65  Pulse: 80 75  85  Resp: 18 18  19   Temp: 98.7 F (37.1 C) 98.9 F (37.2 C)  98.9 F (37.2 C)  TempSrc:      SpO2: 100% 99%  98%  Weight:   80.6 kg   Height:         Author: Drue ONEIDA Potter, MD 11/28/2024 3:53 PM  For on call review [email or web address].  "

## 2024-11-29 ENCOUNTER — Encounter: Admission: RE | Payer: Self-pay

## 2024-11-29 ENCOUNTER — Ambulatory Visit: Admission: RE | Admit: 2024-11-29 | Source: Home / Self Care | Admitting: Vascular Surgery

## 2024-11-29 DIAGNOSIS — I132 Hypertensive heart and chronic kidney disease with heart failure and with stage 5 chronic kidney disease, or end stage renal disease: Secondary | ICD-10-CM | POA: Diagnosis not present

## 2024-11-29 DIAGNOSIS — E875 Hyperkalemia: Secondary | ICD-10-CM | POA: Diagnosis not present

## 2024-11-29 DIAGNOSIS — N186 End stage renal disease: Secondary | ICD-10-CM | POA: Diagnosis not present

## 2024-11-29 DIAGNOSIS — Z992 Dependence on renal dialysis: Secondary | ICD-10-CM | POA: Diagnosis not present

## 2024-11-29 LAB — GLUCOSE, CAPILLARY
Glucose-Capillary: 110 mg/dL — ABNORMAL HIGH (ref 70–99)
Glucose-Capillary: 82 mg/dL (ref 70–99)
Glucose-Capillary: 136 mg/dL — ABNORMAL HIGH (ref 70–99)

## 2024-11-29 LAB — BASIC METABOLIC PANEL WITH GFR
Anion gap: 15 (ref 5–15)
BUN: 55 mg/dL — ABNORMAL HIGH (ref 8–23)
CO2: 25 mmol/L (ref 22–32)
Calcium: 9.5 mg/dL (ref 8.9–10.3)
Chloride: 101 mmol/L (ref 98–111)
Creatinine, Ser: 6.92 mg/dL — ABNORMAL HIGH (ref 0.61–1.24)
GFR, Estimated: 8 mL/min — ABNORMAL LOW
Glucose, Bld: 95 mg/dL (ref 70–99)
Potassium: 4.1 mmol/L (ref 3.5–5.1)
Sodium: 140 mmol/L (ref 135–145)

## 2024-11-29 LAB — POTASSIUM: Potassium: 4 mmol/L (ref 3.5–5.1)

## 2024-11-29 SURGERY — DIALYSIS/PERMA CATHETER INSERTION
Anesthesia: Moderate Sedation

## 2024-11-29 MED ORDER — METHYLPREDNISOLONE SODIUM SUCC 125 MG IJ SOLR: 125.0000 mg | Freq: Once | INTRAMUSCULAR | Status: DC | PRN

## 2024-11-29 MED ORDER — SODIUM CHLORIDE 0.9 % IV SOLN: INTRAVENOUS | Status: DC

## 2024-11-29 MED ORDER — MIDAZOLAM HCL 2 MG/ML PO SYRP: 8.0000 mg | ORAL_SOLUTION | Freq: Once | ORAL | Status: DC | PRN

## 2024-11-29 MED ORDER — ONDANSETRON HCL 4 MG/2ML IJ SOLN: 4.0000 mg | Freq: Four times a day (QID) | INTRAMUSCULAR | Status: DC | PRN

## 2024-11-29 MED ORDER — EPOETIN ALFA-EPBX 10000 UNIT/ML IJ SOLN
10000.0000 [IU] | INTRAMUSCULAR | Status: DC
  Administered 2024-11-29: 10000 [IU] via INTRAVENOUS

## 2024-11-29 MED ORDER — OXYCODONE HCL 5 MG PO CAPS: 10.0000 mg | ORAL_CAPSULE | ORAL | 0 refills | Status: AC | PRN

## 2024-11-29 MED ORDER — HYDROMORPHONE HCL 1 MG/ML IJ SOLN: 1.0000 mg | Freq: Once | INTRAMUSCULAR | Status: DC | PRN

## 2024-11-29 MED ORDER — DIPHENHYDRAMINE HCL 50 MG/ML IJ SOLN: 50.0000 mg | Freq: Once | INTRAMUSCULAR | Status: DC | PRN

## 2024-11-29 MED ORDER — FAMOTIDINE 20 MG PO TABS: 40.0000 mg | ORAL_TABLET | Freq: Once | ORAL | Status: DC | PRN

## 2024-11-29 MED ORDER — CEFAZOLIN SODIUM-DEXTROSE 1-4 GM/50ML-% IV SOLN
1.0000 g | INTRAVENOUS | Status: DC
  Filled 2024-11-29: qty 50

## 2024-11-29 MED ORDER — EPOETIN ALFA-EPBX 10000 UNIT/ML IJ SOLN
INTRAMUSCULAR | Status: AC
  Filled 2024-11-29: qty 1

## 2024-11-29 NOTE — TOC Transition Note (Signed)
 Transition of Care [facility]) - Discharge Note   Patient Details  Name: [name] MRN: [id number] Date of Birth: [birth year]  Transition of Care [facility]) CM/SW Contact:  Corean ONEIDA Haddock, RN Phone Number: 11/29/2024, 3:29 PM   Clinical Narrative:     Patient will DC to: [facility] Anticipated DC date: 11/29/2024  Family notified: Per contact list, not to update family.  Patient to update should he choose  Transport by: [facility]   Per MD patient ready for DC to . RN, patient, , and facility notified of DC. Discharge Summary sent to facility. RN given number for report. DC packet on chart. Ambulance transport requested for patient.   TOC signing off.   Final next level of care: Skilled Nursing Facility Barriers to Discharge: Continued Medical Work up   Patient Goals and CMS Choice            Discharge Placement                       Discharge Plan and Services Additional resources added to the After Visit Summary for                                       Social Drivers of Health (SDOH) Interventions SDOH Screenings   Food Insecurity: Patient Declined (11/24/2024)  Housing: Unknown (11/24/2024)  Transportation Needs: Patient Declined (11/24/2024)  Utilities: Patient Declined (11/24/2024)  Depression (PHQ2-9): Low Risk (07/21/2024)  Financial Resource Strain: Low Risk  (12/18/2023)   Received from [facility]  Social Connections: Unknown (11/24/2024)  Tobacco Use: High Risk (11/24/2024)     Readmission Risk Interventions    03/26/2024   11:46 AM  Readmission Risk Prevention Plan  PCP or Specialist Appt within 3-5 Days Complete  Social Work Consult for Recovery Care Planning/Counseling Complete  Palliative Care Screening Not Applicable  Medication Review Oceanographer) Complete

## 2024-11-29 NOTE — Progress Notes (Signed)
 [facility], [place] 11/29/2024  Subjective:   LOS: 4  Patient seen and evaluated during dialysis    HEMODIALYSIS FLOWSHEET:  Blood Flow Rate (mL/min): 250 mL/min Arterial Pressure (mmHg): -135.55 mmHg Venous Pressure (mmHg): 202.01 mmHg TMP (mmHg): 25.65 mmHg Ultrafiltration Rate (mL/min): 600 mL/min Dialysate Flow Rate (mL/min): 300 ml/min Bolus Amount (mL): 200 mL (saline given for asymptomatic  hypotension)  Refused chair today due to lack of appropriate foot wear   Objective:  Vital signs in last 24 hours:  Temp:  [98.4 F (36.9 C)-98.9 F (37.2 C)] 98.7 F (37.1 C) (01/05 0805) Pulse Rate:  [68-88] 75 (01/05 0830) Resp:  [7-18] 7 (01/05 0830) BP: (95-137)/(55-66) 95/55 (01/05 0900) SpO2:  [96 %-100 %] 96 % (01/05 0830) Weight:  [80 kg-81.2 kg] 80 kg (01/05 0805)  Weight change: 0.6 kg Filed Weights   11/28/24 0500 11/29/24 0107 11/29/24 0805  Weight: 80.6 kg 81.2 kg 80 kg    Intake/Output:    Intake/Output Summary (Last 24 hours) at 11/29/2024 0913 Last data filed at 11/29/2024 0036 Gross per 24 hour  Intake --  Output 200 ml  Net -200 ml     Physical Exam: General: No acute distress  HEENT Moist oral mucous membrane  Pulm/lungs Normal breathing effort on room air  CVS/Heart Regular, no rub  Abdomen:  Soft, nontender, nondistended  Extremities: Trace edema bilaterally  Neurologic: Alert, able to answer questions appropriately  Skin: No acute rashes  Access: Left upper extremity AV fistula       Basic Metabolic Panel:  Recent Labs  Lab 11/25/24 0445 11/26/24 0425 11/27/24 0441 11/28/24 0551 11/29/24 0505  NA 140 137 135 140 140  K 5.4* 4.6 4.2 4.1 4.1  CL 103 98 96* 100 101  CO2 22 24 28 26 25   GLUCOSE 85 86 121* 100* 95  BUN 89* 68* 39* 48* 55*  CREATININE 6.59* 6.04* 4.49* 5.95* 6.92*  CALCIUM  9.1 9.0 9.2 9.6 9.5  PHOS  --  4.9*  --   --   --      CBC: Recent Labs  Lab 11/24/24 1318 11/25/24 0445  11/27/24 0441 11/28/24 0551  WBC 5.5 4.2 6.2 6.8  NEUTROABS 3.2  --  3.9 4.3  HGB 8.4* 8.0* 8.0* 8.1*  HCT 26.3* 25.1* 24.7* 26.2*  MCV 86.5 85.1 83.2 87.0  PLT 195 191 184 194      Lab Results  Component Value Date   HEPBSAG NON REACTIVE 11/24/2024   HEPBSAB Reactive (A) 11/23/2021   HEPBIGM NON REACTIVE 11/21/2021      Microbiology:  No results found for this or any previous visit (from the past 240 hours).  Coagulation Studies: No results for input(s): LABPROT, INR in the last 72 hours.  Urinalysis: No results for input(s): COLORURINE, LABSPEC, PHURINE, GLUCOSEU, HGBUR, BILIRUBINUR, KETONESUR, PROTEINUR, UROBILINOGEN, NITRITE, LEUKOCYTESUR in the last 72 hours.  Invalid input(s): APPERANCEUR    Imaging: No results found.    Medications:     amLODipine   10 mg Oral Daily   ascorbic acid   250 mg Oral BID   aspirin  EC  81 mg Oral Daily   atorvastatin   40 mg Oral QHS   cholecalciferol   2,000 Units Oral Daily   clopidogrel   75 mg Oral Q0600   epoetin  alfa-epbx (RETACRIT ) injection  4,000 Units Intravenous Q M,W,F-1800   ferrous sulfate   325 mg Oral QODAY   heparin   5,000 Units Subcutaneous Q8H   hydrALAZINE   50 mg Oral  TID   insulin  aspart  0-5 Units Subcutaneous QHS   insulin  aspart  0-9 Units Subcutaneous TID WC   lidocaine   1 patch Transdermal Q24H   lisinopril   10 mg Oral Daily   pantoprazole   40 mg Oral Daily   pregabalin   75 mg Oral QHS   rOPINIRole   0.25 mg Oral QHS   senna-docusate  1 tablet Oral BID   sertraline   50 mg Oral Daily   acetaminophen , oxyCODONE   Assessment/ Plan:  64 y.o. male with diabetes, hypertension, stroke with left upper extremity weakness, peripheral arterial disease, diabetic nephropathy/CKD, history of right femoral endarterectomy was admitted on 11/24/2024 for  Principal Problem:   ESRD on hemodialysis (HCC)  Hyperkalemia [E87.5] Uremic acidosis [N25.89] AKI (acute kidney injury)  [N17.9] ESRD on hemodialysis (HCC) [N18.6, Z99.2]  #.  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  Due to no comorbidities, it is safer to initiate hemodialysis in the inpatient setting. He has a well-developed left upper extremity AV fistula which will be used. Receiving dialysis today, UF goal 1L as tolerated. Was scheduled to sit in chair today however refused due to lack of shoes. Patient recently seen in outpatient nephrology office seated comfortably in chair. No concerns for ability to complete this.  Next treatment scheduled for Wednesday. Awaiting outpatient clinic assignment  #. Anemia of CKD  Lab Results  Component Value Date   HGB 8.1 (L) 11/28/2024   Continue Retacrit  10000 units IV with dialysis.    #. Secondary hyperparathyroidism of renal origin N 25.81      Component Value Date/Time   PTH 146 (H) 11/21/2021 1610   Lab Results  Component Value Date   PHOS 4.9 (H) 11/26/2024   Will continue to monitor bone minerals. Continue cholecalciferol     #. Diabetes type 2 with CKD Hgb A1c MFr Bld (%)  Date Value  11/24/2024 6.0 (H)   Glucose well controlled. Primary team to continue management of SSI   LOS: 4 Faith Harris 1/5/20269:13 AM  [facility] [place], [place] [phone]

## 2024-11-29 NOTE — Progress Notes (Signed)
 Contacted by Nephrology for possible CLIP regarding outpatient HD. Pt accepted at [facility] on TTS at 6:00am. Pt can start 12/02/2024 and will need to arrive at 5:45am for new pt paperwork. Navigator will continue to assist.                       . Suzen Satchel Dialysis Navigator [phone] Will continue to assist.

## 2024-11-29 NOTE — Discharge Summary (Signed)
 " Physician Discharge Summary   Patient: [name] MRN: [id number] DOB: 1961/10/20  Admit date:     11/24/2024  Discharge date: 11/29/2024  Discharge Physician: Drue ONEIDA Potter   PCP: Austin Candyce Stall, MD   Recommendations at discharge:   Discharge Diagnoses: Principal Problem:   ESRD on hemodialysis [name])  Resolved Problems:   * No resolved hospital problems. [name] Course: From HPI [name] is a 64 y.o. male with medical history significant of ESRD on hemodialysis, GERD, congestive heart failure, diabetes mellitus type 2, glaucoma, hyperlipidemia, hypertension, osteoarthritis, vitamin D  deficiency, patient was otherwise well until his lab results today showed abnormal renal function as well as hyperkalemia and therefore brought into the emergency room for further management.  I did tell patient will be seen he denied any acute complaints except feeling cold.     ED course: Upon arrival to the emergency room patient was found to have temperature 98.2, respiratory rate 20 pulse 90, blood pressure 175/69 saturating 95% on room air.  Lab results shows potassium 5.5, ED physician discussed with nephrologist who has agreed to initiate hemodialysis    Other hospital course as noted below:   Assessment and Plan:    ESRD on hemodialysis Acute metabolic acidosis Continue hemodialysis Monitor renal function closely Nephrologist on board for dialysis needs Patient cleared for discharge today by nephrologist [name] has been accepted to [facility] on TTS at [employer] [facility] for his out-pt HD. Pt is good to start on Thrusday 12/03/2023 and would need to arrive at 5:45am    Normocytic anemia likely secondary to anemia of chronic kidney disease Monitor CBC as an outpatient     Chronic diastolic congestive heart failure Monitor input and output Daily weighing Continue lisinopril    Type 2 diabetes mellitus Continue outpatient antidiabetics   Hyperlipidemia-continue  statin therapy   Essential hypertension Continue home medications   Osteoarthritis Continue as needed pain medication   vitamin D  deficiency,  Continue oral supplementation    Consultants: Nephrology Procedures performed: Hemodialysis Disposition: Skilled nursing facility Diet recommendation:  Renal diet DISCHARGE MEDICATION: Allergies as of 11/29/2024   No Known Allergies      Medication List     TAKE these medications    rOPINIRole  0.25 MG tablet Commonly known as: REQUIP  Take 0.25 mg by mouth at bedtime. The timing of this medication is very important.   amLODipine  10 MG tablet Commonly known as: NORVASC  Take 10 mg by mouth daily.   ammonium lactate 12 % lotion Commonly known as: LAC-HYDRIN Apply 1 Application topically at bedtime. Apply to arms, lower legs, feet, and occipital scalp   ascorbic acid  500 MG tablet Commonly known as: VITAMIN C  Take 250 mg by mouth 2 (two) times daily.   aspirin  EC 81 MG tablet Take 81 mg by mouth daily. Swallow whole.   atorvastatin  40 MG tablet Commonly known as: LIPITOR Take 1 tablet (40 mg total) by mouth at bedtime.   cholecalciferol  25 MCG (1000 UNIT) tablet Commonly known as: VITAMIN D3 Take 2,000 Units by mouth daily.   clopidogrel  75 MG tablet Commonly known as: PLAVIX  Take 1 tablet (75 mg total) by mouth daily at 6 (six) AM.   desonide 0.05 % cream Commonly known as: DESOWEN Apply 1 Application topically 2 (two) times daily.   eucerin cream Apply 1 application  topically in the morning and at bedtime. Apply to BLE   feeding supplement Liqd Take 237 mLs by mouth 2 (two) times  daily between meals.   ferrous sulfate  325 (65 FE) MG tablet Take 325 mg by mouth every other day.   hydrALAZINE  50 MG tablet Commonly known as: APRESOLINE  Take 50 mg by mouth 3 (three) times daily.   ketoconazole 2 % shampoo Commonly known as: NIZORAL Apply 1 application topically 2 (two) times a week.   lidocaine  5  % Commonly known as: LIDODERM  Place 1 patch onto the skin daily. Apply to left foot and right middle finger   lidocaine  4 % cream Commonly known as: LMX Apply 1 Application topically 2 (two) times daily.   lisinopril  10 MG tablet Commonly known as: ZESTRIL  Take 10 mg by mouth daily.   magnesium  hydroxide 400 MG/5ML suspension Commonly known as: MILK OF MAGNESIA Take 5 mLs by mouth daily as needed for mild constipation.   metFORMIN 500 MG tablet Commonly known as: GLUCOPHAGE Take 500 mg by mouth daily with breakfast.   MULTIVITAMIN PLUS IRON ADULT PO Take 1 tablet by mouth daily.   mupirocin ointment 2 % Commonly known as: BACTROBAN Apply 1 Application topically 3 (three) times daily.   naloxone 4 MG/0.1ML Liqd nasal spray kit Commonly known as: NARCAN Place 4 mg into the nose 3 (three) times daily as needed.   oxycodone  5 MG capsule Commonly known as: OXY-IR Take 1 capsule (5 mg total) by mouth every 4 (four) hours as needed. What changed:  how much to take reasons to take this Another medication with the same name was removed. Continue taking this medication, and follow the directions you see here.   pregabalin  75 MG capsule Commonly known as: LYRICA  Take 75 mg by mouth at bedtime.   primidone  50 MG tablet Commonly known as: MYSOLINE  Take 12.5 mg by mouth 2 (two) times daily.   senna-docusate 8.6-50 MG tablet Commonly known as: Senokot-S Take 1 tablet by mouth 2 (two) times daily. HOLD FOR LOOSE STOOLS   sertraline  50 MG tablet Commonly known as: ZOLOFT  Take 50 mg by mouth daily.        Discharge Exam: Filed Weights   11/29/24 0107 11/29/24 0805 11/29/24 1149  Weight: 81.2 kg 80 kg 80.6 kg   General exam: Elderly male laying in bed in no acute distress Respiratory system: Decreased air entry bibasilarly Cardiovascular system: S1 & S2+.  Gastrointestinal system: abd is soft, NT, ND & hypoactive bowel sounds Central nervous system: alert & oriented.  Moves all extremities  Musculoskeletal: Bilateral lower extremity pitting edema but improved Psychiatry: judgement and insight appears at baseline. Flat mood and affect   Condition at discharge: good  The results of significant diagnostics from this hospitalization (including imaging, microbiology, ancillary and laboratory) are listed below for reference.   Imaging Studies: DG Chest 1 View Result Date: 11/26/2024 CLINICAL DATA:  End-stage chronic kidney disease. Rule out tuberculosis for outpatient clinic placement. EXAM: DG CHEST 1V COMPARISON:  Chest radiograph 11/20/2021 FINDINGS: Patient is rotated. The heart is normal in size. Mediastinal contours are normal. Subsegmental scarring at the left lung base. No acute airspace disease. No pleural effusion or pneumothorax. Remote right rib fractures. IMPRESSION: No acute chest findings.  No radiographic findings of tuberculosis. Electronically Signed   By: Andrea Gasman M.D.   On: 11/26/2024 12:07    Microbiology: Results for orders placed or performed during the hospital encounter of 03/23/24  MRSA Next Gen by PCR, Nasal     Status: None   Collection Time: 03/24/24  6:05 PM   Specimen: Nasal Mucosa; Nasal Swab  Result Value Ref Range Status   MRSA by PCR Next Gen NOT DETECTED NOT DETECTED Final    Comment: (NOTE) The GeneXpert MRSA Assay (FDA approved for NASAL specimens only), is one component of a comprehensive MRSA colonization surveillance program. It is not intended to diagnose MRSA infection nor to guide or monitor treatment for MRSA infections. Test performance is not FDA approved in patients less than 2 years old. Performed at [facility], [address] [place], [place] [address]     Labs: CBC: Recent Labs  Lab 11/24/24 1318 11/25/24 0445 11/27/24 0441 11/28/24 0551  WBC 5.5 4.2 6.2 6.8  NEUTROABS 3.2  --  3.9 4.3  HGB 8.4* 8.0* 8.0* 8.1*  HCT 26.3* 25.1* 24.7* 26.2*  MCV 86.5 85.1 83.2 87.0  PLT 195  191 184 194   Basic Metabolic Panel: Recent Labs  Lab 11/25/24 0445 11/26/24 0425 11/27/24 0441 11/28/24 0551 11/29/24 0505  NA 140 137 135 140 140  K 5.4* 4.6 4.2 4.1 4.1  CL 103 98 96* 100 101  CO2 22 24 28 26 25   GLUCOSE 85 86 121* 100* 95  BUN 89* 68* 39* 48* 55*  CREATININE 6.59* 6.04* 4.49* 5.95* 6.92*  CALCIUM  9.1 9.0 9.2 9.6 9.5  PHOS  --  4.9*  --   --   --    Liver Function Tests: Recent Labs  Lab 11/24/24 1318  AST 22  ALT 17  ALKPHOS 80  BILITOT 0.3  PROT 7.4  ALBUMIN 4.3   CBG: Recent Labs  Lab 11/28/24 1150 11/28/24 1603 11/28/24 2133 11/29/24 0735 11/29/24 1313  GLUCAP 195* 86 152* 110* 82    Discharge time spent:  38 minutes.  Signed: Drue ONEIDA Potter, MD [facility] 11/29/2024 "

## 2024-11-29 NOTE — Progress Notes (Signed)
" °   11/29/24 1149  Vitals  Temp (!) 97.4 F (36.3 C)  BP (!) 95/57  Pulse Rate 74  Resp 18  Weight 80.6 kg  Type of Weight Post-Dialysis  Oxygen Therapy  SpO2 99 %  O2 Device Room Air  Patient Activity (if Appropriate) In bed  Pulse Oximetry Type Continuous  Oximetry Probe Site Changed No  Post Treatment  Dialyzer Clearance Clear  Hemodialysis Intake (mL) 400 mL  Liters Processed 74.3  Fluid Removed (mL) 900 mL  Tolerated HD Treatment No (Comment) (Blood pressure has improved with saline bolus. Patient has no verbalized concerns. Hypotension was asymptomatic. Bleed time minimal. Patiient is stable to return to his assigned  unit.)  AVG/AVF Arterial Site Held (minutes) 5 minutes  AVG/AVF Venous Site Held (minutes) 5 minutes    "

## 2024-11-29 NOTE — Progress Notes (Signed)
 Nursing report given to Callao, LPN at [facility] Patient currently waiting on transport to arrive. Patient denies any needs at this moment and call bell is within the patient's reach. Patient educated to use call bell if any needs arise while waiting on transportation.

## 2024-11-29 NOTE — Progress Notes (Signed)
 Occupational Therapy Treatment Patient Details Name: [name] MRN: [id number] DOB: 1961-08-12 Today's Date: 11/29/2024   History of present illness 64 y.o. male with medical history significant of ESRD on hemodialysis, GERD, congestive heart failure, diabetes mellitus type 2, glaucoma, hyperlipidemia, hypertension, osteoarthritis, vitamin D  deficiency, patient was otherwise well until his lab results today showed abnormal renal function as well as hyperkalemia and therefore brought into the emergency room for further management.   OT comments  Pt is supine in bed on arrival. Pleasant and agreeable to OT session. He denies pain. Pt noted to be soiled from purewick malfunction. NT in as 2nd assist during session. Pt required Max A +1 for supine to sit and +2 to return to supine. Tolerated sitting EOB for extensive time for ADL session with CGA for seated balance. Mod A required for UB bathing/dressing and Max A for LB bathing and dressing to donn shoes. Pt performed x2 standing bouts from significantly elevated bed height with Min A +2 for safety with ability to stand x1 min for linen change and peri-care. Pt then able to lateral scoot towards HOB with CGA. He endorses fatigue, but put forth good effort throughout session. Pt returned to bed with all needs in place and will cont to require skilled acute OT services to maximize his safety and IND to return to PLOF.       If plan is discharge home, recommend the following:  A lot of help with bathing/dressing/bathroom;A lot of help with walking and/or transfers;A little help with walking and/or transfers   Equipment Recommendations  None recommended by OT    Recommendations for Other Services      Precautions / Restrictions Precautions Precautions: Fall Recall of Precautions/Restrictions: Intact Restrictions Weight Bearing Restrictions Per Provider Order: No       Mobility Bed Mobility Overal bed mobility: Needs Assistance Bed  Mobility: Supine to Sit, Sit to Supine     Supine to sit: Max assist, HOB elevated Sit to supine: Max assist, +2 for safety/equipment   General bed mobility comments: utilized chux pads to scoot towards EOB and pt able to assist some with bringing trunk into upright position; tolerated sitting EOB with CGA/SBA for all bathing tasks    Transfers Overall transfer level: Needs assistance Equipment used: 2 person hand held assist Transfers: Sit to/from Stand Sit to Stand: Min assist, +2 physical assistance, From elevated surface           General transfer comment: significantly elevated bed height and pt able to stand with Min A +2 on 2 separate bouts for LB bathing and linen change after purewick leak; able to lateal scoot towards HOB with CGA and increased time     Balance Overall balance assessment: Needs assistance Sitting-balance support: Feet supported, Single extremity supported Sitting balance-Leahy Scale: Fair     Standing balance support: Bilateral upper extremity supported Standing balance-Leahy Scale: Poor Standing balance comment: Min A +2 via HHA                           ADL either performed or assessed with clinical judgement   ADL Overall ADL's : Needs assistance/impaired         Upper Body Bathing: Minimal assistance;Sitting;Moderate assistance Upper Body Bathing Details (indicate cue type and reason): at EOB with CHG wipes Lower Body Bathing: Maximal assistance;Sitting/lateral leans;Sit to/from stand   Upper Body Dressing : Maximal assistance;Sitting Upper Body Dressing Details (indicate cue type and  reason): change out gowns Lower Body Dressing: Sitting/lateral leans Lower Body Dressing Details (indicate cue type and reason): donn bil shoes                    Extremity/Trunk Assessment              Vision       Perception     Praxis     Communication Communication Communication: No apparent difficulties   Cognition  Arousal: Alert Behavior During Therapy: Flat affect                                 Following commands: Intact        Cueing   Cueing Techniques: Verbal cues, Gestural cues  Exercises      Shoulder Instructions       General Comments noted bleeding to LUE above dialysis port-NT aware and placed bandage; purewick removed and replaced by NT during session    Pertinent Vitals/ Pain       Pain Assessment Pain Assessment: No/denies pain Pain Intervention(s): Monitored during session  Home Living                                          Prior Functioning/Environment              Frequency  Min 2X/week        Progress Toward Goals  OT Goals(current goals can now be found in the care plan section)  Progress towards OT goals: Progressing toward goals  Acute Rehab OT Goals Patient Stated Goal: get better OT Goal Formulation: With patient Time For Goal Achievement: 12/09/24 Potential to Achieve Goals: Fair  Plan      Co-evaluation                 AM-PAC OT 6 Clicks Daily Activity     Outcome Measure   Help from another person eating meals?: A Little Help from another person taking care of personal grooming?: A Little Help from another person toileting, which includes using toliet, bedpan, or urinal?: A Lot Help from another person bathing (including washing, rinsing, drying)?: A Lot Help from another person to put on and taking off regular upper body clothing?: A Lot Help from another person to put on and taking off regular lower body clothing?: A Lot 6 Click Score: 14    End of Session    OT Visit Diagnosis: Unsteadiness on feet (R26.81);Repeated falls (R29.6);Muscle weakness (generalized) (M62.81)   Activity Tolerance Patient tolerated treatment well   Patient Left in bed;with call bell/phone within reach;with nursing/sitter in room   Nurse Communication Mobility status        Time: 8474-8445 OT Time  Calculation (min): 29 min  Charges: OT General Charges $OT Visit: 1 Visit OT Treatments $Self Care/Home Management : 23-37 mins  Nolie Bignell Chrismon, OTR/L  11/29/2024, 4:50 PM   Darrio Bade E Chrismon 11/29/2024, 4:47 PM

## 2024-11-29 NOTE — Progress Notes (Signed)
 OT Cancellation Note  Patient Details Name: HASANI DIEMER MRN: [id number] DOB: May 28, 1961   Cancelled Treatment:    Reason Eval/Treat Not Completed: Other (comment). Pt noted to be off the floor for procedure, unavailable at this time. Will continue to follow POC at later date/time as pt available.    Donnella Morford E Chrismon 11/29/2024, 9:19 AM

## 2024-11-29 NOTE — Progress Notes (Signed)
 Patient being transported back to [facility]. Denies pain or discomfort. [facility] transporting patient. Attempted x 3 to call report however no one answered the phone.

## 2024-11-29 NOTE — NC FL2 (Signed)
 " [place]  MEDICAID FL2 LEVEL OF CARE FORM     IDENTIFICATION  Patient Name: [name] Birthdate: [birth year] Sex: male Admission Date (Current Location): 11/24/2024  [facility] and [place] Number:  Chiropodist and Address:         Provider Number: [id number]  Attending Physician Name and Address:  Dorinda Drue DASEN, MD  Relative Name and Phone Number:       Current Level of Care:   Recommended Level of Care: Nursing Facility Prior Approval Number:    Date Approved/Denied:   PASRR Number: [id number]  Discharge Plan: SNF    Current Diagnoses: Patient Active Problem List   Diagnosis Date Noted   ESRD on hemodialysis (HCC) 11/24/2024   Elevated C-reactive protein (CRP) 07/29/2024   Elevated sed rate 07/29/2024   Elevated BUN 07/29/2024   Elevated serum creatinine 07/29/2024   Decreased glomerular filtration rate (GFR) 07/29/2024   Cervical fracture of C7 vertebra, sequela 07/21/2024   Chronic pain syndrome 07/21/2024   Pharmacologic therapy 07/21/2024   Disorder of skeletal system 07/21/2024   Problems influencing health status 07/21/2024   Abnormal MRI, cervical spine (12/21/2023) 07/21/2024   Abnormal angiogram of head (12/22/2023) 07/21/2024   Chronic foot pain (Left) 07/21/2024   Mild cognitive impairment 07/21/2024   Legally blind 07/21/2024   History of stroke with residual deficit 07/21/2024   Chronic anticoagulation (Plavix ) 07/21/2024   Pressure injury of skin 03/29/2024   Atherosclerosis of artery of extremity with ulceration (HCC) 03/29/2024   PAD (peripheral artery disease) 03/23/2024   Acute CVA (cerebrovascular accident) (HCC) 12/22/2023   Hypertensive urgency 12/22/2023   Dyslipidemia 12/22/2023   Peripheral neuropathy 12/22/2023   CAD (coronary artery disease) 01/30/2022   Acute urinary retention 11/23/2021   History of hepatitis C 11/22/2021   Bacteremia due to Klebsiella pneumoniae 11/22/2021   UTI (urinary tract infection)  11/21/2021   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) 11/21/2021   Severe sepsis (HCC) 11/21/2021   Allergic rhinitis 10/22/2021   Generalized anxiety disorder 10/22/2021   GERD (gastroesophageal reflux disease) 10/22/2021   Chronic hepatitis (HCC) 10/22/2021   HL (hearing loss) 10/22/2021   Primary hypertension 10/22/2021   Iron deficiency anemia 10/22/2021   Hyperlipidemia 10/22/2021   Ischemic heart disease 10/22/2021   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HCC) 10/22/2021   Chronic renal disease, stage V (HCC) 10/22/2021   Difficulty walking 12/10/2020   Tremor 11/04/2018   Peripheral vascular disease 01/01/2018   Non-insulin  dependent type 2 diabetes mellitus (HCC) 06/02/2017   Anemia 06/02/2017   Muscle weakness 06/02/2017   Hypertensive disorder 06/02/2017   Left leg pain 08/29/2015   Cerumen impaction 05/31/2013   Hearing loss of both ears 05/31/2013   Chronic osteomyelitis of lower leg (HCC) 07/23/2012    Orientation RESPIRATION BLADDER Height & Weight     Self, Time, Place  Normal Incontinent Weight: 80.6 kg Height:  6' 1 (185.4 cm)  BEHAVIORAL SYMPTOMS/MOOD NEUROLOGICAL BOWEL NUTRITION STATUS      Continent Diet (renal)  AMBULATORY STATUS COMMUNICATION OF NEEDS Skin   Total Care Verbally Other (Comment) (Diabetic ulcer rt toe, wound on left buttocks)                       Personal Care Assistance Level of Assistance              Functional Limitations Info  Sight Sight Info: Impaired        SPECIAL CARE FACTORS FREQUENCY  Contractures Contractures Info: Not present    Additional Factors Info  Code Status, Allergies Code Status Info: full Allergies Info: NKDA           Current Medications (11/29/2024):  This is the current hospital active medication list Current Facility-Administered Medications  Medication Dose Route Frequency Provider Last Rate Last Admin   0.9 %  sodium chloride  infusion   Intravenous Continuous Brown,  Fallon E, NP       acetaminophen  (TYLENOL ) tablet 650 mg  650 mg Oral Q6H PRN Dorinda Drue DASEN, MD   650 mg at 11/26/24 2146   amLODipine  (NORVASC ) tablet 10 mg  10 mg Oral Daily Djan, Prince T, MD   10 mg at 11/28/24 0913   ascorbic acid  (VITAMIN C ) tablet 250 mg  250 mg Oral BID Djan, Prince T, MD   250 mg at 11/29/24 1247   aspirin  EC tablet 81 mg  81 mg Oral Daily Djan, Prince T, MD   81 mg at 11/29/24 1247   atorvastatin  (LIPITOR) tablet 40 mg  40 mg Oral QHS Djan, Prince T, MD   40 mg at 11/28/24 2132   ceFAZolin  (ANCEF ) IVPB 1 g/50 mL premix  1 g Intravenous 30 min Pre-Op  Brown, Fallon E, NP       cholecalciferol  (VITAMIN D3) 25 MCG (1000 UNIT) tablet 2,000 Units  2,000 Units Oral Daily Djan, Prince T, MD   2,000 Units at 11/29/24 1246   clopidogrel  (PLAVIX ) tablet 75 mg  75 mg Oral Q0600 Djan, Prince T, MD   75 mg at 11/29/24 0538   diphenhydrAMINE  (BENADRYL ) injection 50 mg  50 mg Intravenous Once PRN Brown, Fallon E, NP       epoetin  alfa-epbx (RETACRIT ) injection 10,000 Units  10,000 Units Intravenous Q M,W,F-1800 Breeze, Shantelle, NP   10,000 Units at 11/29/24 1159   famotidine  (PEPCID ) tablet 40 mg  40 mg Oral Once PRN Brown, Fallon E, NP       ferrous sulfate  tablet 325 mg  325 mg Oral QODAY Djan, Prince T, MD   325 mg at 11/28/24 0913   heparin  injection 5,000 Units  5,000 Units Subcutaneous Q8H Dorinda Drue DASEN, MD   5,000 Units at 11/29/24 9461   hydrALAZINE  (APRESOLINE ) tablet 50 mg  50 mg Oral TID Djan, Prince T, MD   50 mg at 11/28/24 2132   HYDROmorphone  (DILAUDID ) injection 1 mg  1 mg Intravenous Once PRN Brown, Fallon E, NP       insulin  aspart (novoLOG ) injection 0-5 Units  0-5 Units Subcutaneous QHS Dorinda Drue DASEN, MD       insulin  aspart (novoLOG ) injection 0-9 Units  0-9 Units Subcutaneous TID WC Dorinda Drue DASEN, MD   2 Units at 11/28/24 1244   lidocaine  (LIDODERM ) 5 % 1 patch  1 patch Transdermal Q24H Cleatus Delayne GAILS, MD   1 patch at 11/28/24 2230   lisinopril  (ZESTRIL )  tablet 10 mg  10 mg Oral Daily Djan, Prince T, MD   10 mg at 11/28/24 0913   methylPREDNISolone  sodium succinate (SOLU-MEDROL ) 125 mg/2 mL injection 125 mg  125 mg Intravenous Once PRN Brown, Fallon E, NP       midazolam  (VERSED ) 2 MG/ML syrup 8 mg  8 mg Oral Once PRN Brown, Fallon E, NP       ondansetron  (ZOFRAN ) injection 4 mg  4 mg Intravenous Q6H PRN Brown, Fallon E, NP       oxyCODONE  (Oxy IR/ROXICODONE ) immediate release tablet 5 mg  5 mg Oral Q4H PRN Dorinda Homans T, MD   5 mg at 11/28/24 2132   pantoprazole  (PROTONIX ) EC tablet 40 mg  40 mg Oral Daily Djan, Prince T, MD   40 mg at 11/29/24 1247   pregabalin  (LYRICA ) capsule 75 mg  75 mg Oral QHS Dorinda Homans T, MD   75 mg at 11/28/24 2131   rOPINIRole  (REQUIP ) tablet 0.25 mg  0.25 mg Oral QHS Dorinda Homans T, MD   0.25 mg at 11/28/24 2138   senna-docusate (Senokot-S) tablet 1 tablet  1 tablet Oral BID Dorinda Homans DASEN, MD   1 tablet at 11/29/24 1247   sertraline  (ZOLOFT ) tablet 50 mg  50 mg Oral Daily Djan, Prince T, MD   50 mg at 11/29/24 1247     Discharge Medications: Please see discharge summary for a list of discharge medications.  Relevant Imaging Results:  Relevant Lab Results:   Additional Information SS#: [id number], [facility] on TTS at 6:00am  Corean DASEN Haddock, RN     "

## 2025-01-31 ENCOUNTER — Encounter (INDEPENDENT_AMBULATORY_CARE_PROVIDER_SITE_OTHER)

## 2025-01-31 ENCOUNTER — Ambulatory Visit (INDEPENDENT_AMBULATORY_CARE_PROVIDER_SITE_OTHER): Admitting: Vascular Surgery
